# Patient Record
Sex: Female | Born: 1975 | Race: Black or African American | Hispanic: No | Marital: Married | Smoking: Never smoker
Health system: Southern US, Community
[De-identification: ages and names within clinical notes are randomized; demographics above are authoritative.]

## PROBLEM LIST (undated history)

## (undated) DIAGNOSIS — N62 Hypertrophy of breast: Secondary | ICD-10-CM

## (undated) DIAGNOSIS — Z5189 Encounter for other specified aftercare: Secondary | ICD-10-CM

## (undated) DIAGNOSIS — E119 Type 2 diabetes mellitus without complications: Secondary | ICD-10-CM

## (undated) DIAGNOSIS — T7840XA Allergy, unspecified, initial encounter: Secondary | ICD-10-CM

## (undated) DIAGNOSIS — I10 Essential (primary) hypertension: Secondary | ICD-10-CM

## (undated) DIAGNOSIS — E669 Obesity, unspecified: Secondary | ICD-10-CM

## (undated) HISTORY — DX: Obesity, unspecified: E66.9

## (undated) HISTORY — DX: Allergy, unspecified, initial encounter: T78.40XA

## (undated) HISTORY — DX: Encounter for other specified aftercare: Z51.89

## (undated) HISTORY — PX: FOOT SURGERY: SHX648

## (undated) HISTORY — DX: Essential (primary) hypertension: I10

---

## 2000-03-25 ENCOUNTER — Inpatient Hospital Stay (HOSPITAL_COMMUNITY): Admission: AD | Admit: 2000-03-25 | Discharge: 2000-03-28 | Payer: Self-pay | Admitting: *Deleted

## 2000-03-25 ENCOUNTER — Inpatient Hospital Stay (HOSPITAL_COMMUNITY): Admission: AD | Admit: 2000-03-25 | Discharge: 2000-03-25 | Payer: Self-pay | Admitting: *Deleted

## 2000-10-26 ENCOUNTER — Encounter: Admission: RE | Admit: 2000-10-26 | Discharge: 2000-11-25 | Payer: Self-pay | Admitting: Obstetrics and Gynecology

## 2000-12-26 ENCOUNTER — Encounter: Admission: RE | Admit: 2000-12-26 | Discharge: 2001-01-25 | Payer: Self-pay | Admitting: Obstetrics and Gynecology

## 2001-01-26 ENCOUNTER — Encounter: Admission: RE | Admit: 2001-01-26 | Discharge: 2001-02-25 | Payer: Self-pay | Admitting: Obstetrics and Gynecology

## 2001-02-17 ENCOUNTER — Emergency Department (HOSPITAL_COMMUNITY): Admission: EM | Admit: 2001-02-17 | Discharge: 2001-02-18 | Payer: Self-pay | Admitting: Emergency Medicine

## 2001-03-28 ENCOUNTER — Encounter: Admission: RE | Admit: 2001-03-28 | Discharge: 2001-04-27 | Payer: Self-pay | Admitting: Obstetrics and Gynecology

## 2003-01-17 ENCOUNTER — Other Ambulatory Visit: Admission: RE | Admit: 2003-01-17 | Discharge: 2003-01-17 | Payer: Self-pay | Admitting: *Deleted

## 2004-01-17 ENCOUNTER — Other Ambulatory Visit: Admission: RE | Admit: 2004-01-17 | Discharge: 2004-01-17 | Payer: Self-pay | Admitting: Obstetrics and Gynecology

## 2005-01-18 ENCOUNTER — Other Ambulatory Visit: Admission: RE | Admit: 2005-01-18 | Discharge: 2005-01-18 | Payer: Self-pay | Admitting: Obstetrics and Gynecology

## 2006-05-28 ENCOUNTER — Inpatient Hospital Stay (HOSPITAL_COMMUNITY): Admission: AD | Admit: 2006-05-28 | Discharge: 2006-05-30 | Payer: Self-pay | Admitting: Obstetrics and Gynecology

## 2010-05-25 ENCOUNTER — Other Ambulatory Visit: Payer: Self-pay | Admitting: Family Medicine

## 2010-05-25 ENCOUNTER — Ambulatory Visit
Admission: RE | Admit: 2010-05-25 | Discharge: 2010-05-25 | Payer: Self-pay | Source: Home / Self Care | Attending: Family Medicine | Admitting: Family Medicine

## 2010-05-25 ENCOUNTER — Encounter: Payer: Self-pay | Admitting: Family Medicine

## 2010-05-25 DIAGNOSIS — I10 Essential (primary) hypertension: Secondary | ICD-10-CM | POA: Insufficient documentation

## 2010-05-25 DIAGNOSIS — E669 Obesity, unspecified: Secondary | ICD-10-CM | POA: Insufficient documentation

## 2010-05-25 LAB — CBC WITH DIFFERENTIAL/PLATELET
Basophils Absolute: 0 10*3/uL (ref 0.0–0.1)
Basophils Relative: 0.2 % (ref 0.0–3.0)
Eosinophils Absolute: 0.2 10*3/uL (ref 0.0–0.7)
Hemoglobin: 12.4 g/dL (ref 12.0–15.0)
Lymphocytes Relative: 13.9 % (ref 12.0–46.0)
MCHC: 33.3 g/dL (ref 30.0–36.0)
Monocytes Relative: 4.9 % (ref 3.0–12.0)
Neutro Abs: 8.9 10*3/uL — ABNORMAL HIGH (ref 1.4–7.7)
Neutrophils Relative %: 79.6 % — ABNORMAL HIGH (ref 43.0–77.0)
RBC: 4.21 Mil/uL (ref 3.87–5.11)
WBC: 11.2 10*3/uL — ABNORMAL HIGH (ref 4.5–10.5)

## 2010-05-25 LAB — HEPATIC FUNCTION PANEL
AST: 27 U/L (ref 0–37)
Albumin: 3.8 g/dL (ref 3.5–5.2)
Alkaline Phosphatase: 81 U/L (ref 39–117)
Bilirubin, Direct: 0.1 mg/dL (ref 0.0–0.3)
Total Protein: 7.2 g/dL (ref 6.0–8.3)

## 2010-05-25 LAB — BASIC METABOLIC PANEL
Calcium: 9.4 mg/dL (ref 8.4–10.5)
Creatinine, Ser: 0.7 mg/dL (ref 0.4–1.2)
GFR: 134.83 mL/min (ref >60.00)
Sodium: 143 mEq/L (ref 135–145)

## 2010-05-25 LAB — LIPID PANEL
HDL: 48 mg/dL (ref >39.00)
LDL Cholesterol: 86 mg/dL (ref 0–99)
Total CHOL/HDL Ratio: 3
Triglycerides: 32 mg/dL (ref 0.0–149.0)
VLDL: 6.4 mg/dL (ref 0.0–40.0)

## 2010-05-25 LAB — CONVERTED CEMR LAB
Bilirubin Urine: NEGATIVE
Ketones, urine, test strip: NEGATIVE
Nitrite: NEGATIVE
Protein, U semiquant: NEGATIVE
Urobilinogen, UA: 0.2

## 2010-05-31 NOTE — Assessment & Plan Note (Signed)
Summary: BRAND NEW PT/TO EST/PT REQ CPX/PT FASTING/CJR   Vital Signs:  Patient profile:   35 year old female Height:      65.25 inches Weight:      235 pounds BMI:     38.95 Temp:     98.6 degrees F oral BP sitting:   150 / 98  (left arm) Cuff size:   regular  Vitals Entered By: Kern Reap CMA Duncan Dull) (May 25, 2010 8:38 AM)  Nutrition Counseling: Patient's BMI is greater than 25 and therefore counseled on weight management options. CC: new to establish care, headache, elevated blood pressure Is Patient Diabetic? No   CC:  new to establish care, headache, and elevated blood pressure.  History of Present Illness: nyjae is a 35 year old, married female, nonsmoker..  G2, P2......... children are 10 and 4....... who comes in today as a new patient for evaluation of hypertension and headaches.  She noticed in December.  She was beginning to have headaches.  She checked her blood pressure at work and her systolic runs 140 to 160, diastolic 80 to 90.  Her father has hypertension and underlying coronary disease.  Review of systems otherwise negative except for her weight 235 pounds.  Couple years ago.  She got down to 200 with diet and exercise.  Tetanus 2003  Preventive Screening-Counseling & Management  Alcohol-Tobacco     Smoking Status: never  Hep-HIV-STD-Contraception     Dental Visit-last 6 months no      Drug Use:  no.    Allergies (verified): No Known Drug Allergies  Past History:  Past medical, surgical, family and social histories (including risk factors) reviewed, and no changes noted (except as noted below).  Past Surgical History: foot surgery (both) 2 childbirth  Family History: Reviewed history and no changes required. Father:CAD, underline HTN, prostate cancer Mother: sinusitis Siblings: 3 brothers  Social History: Reviewed history and no changes required. Occupation: Audit Married Never Smoked Alcohol use-yes Drug use-no Smoking Status:   never Drug Use:  no Dental Care w/in 6 mos.:  no  Review of Systems      See HPI  Physical Exam  General:  Well-developed,well-nourished,in no acute distress; alert,appropriate and cooperative throughout examination Head:  Normocephalic and atraumatic without obvious abnormalities. No apparent alopecia or balding. Eyes:  No corneal or conjunctival inflammation noted. EOMI. Perrla. Funduscopic exam benign, without hemorrhages, exudates or papilledema. Vision grossly normal. Ears:  External ear exam shows no significant lesions or deformities.  Otoscopic examination reveals clear canals, tympanic membranes are intact bilaterally without bulging, retraction, inflammation or discharge. Hearing is grossly normal bilaterally. Nose:  External nasal examination shows no deformity or inflammation. Nasal mucosa are pink and moist without lesions or exudates. Mouth:  Oral mucosa and oropharynx without lesions or exudates.  Teeth in good repair. Neck:  No deformities, masses, or tenderness noted. Chest Wall:  No deformities, masses, or tenderness noted. Breasts:  No mass, nodules, thickening, tenderness, bulging, retraction, inflamation, nipple discharge or skin changes noted.   Lungs:  Normal respiratory effort, chest expands symmetrically. Lungs are clear to auscultation, no crackles or wheezes. Heart:  Normal rate and regular rhythm. S1 and S2 normal without gallop, murmur, click, rub or other extra sounds. Abdomen:  Bowel sounds positive,abdomen soft and non-tender without masses, organomegaly or hernias noted. Pulses:  R and L carotid,radial,femoral,dorsalis pedis and posterior tibial pulses are full and equal bilaterally Psych:  Cognition and judgment appear intact. Alert and cooperative with normal attention span and concentration. No  apparent delusions, illusions, hallucinations   Problems:  Medical Problems Added: 1)  Dx of Obesity  (ICD-278.00) 2)  Dx of Hypertension, Essential Nos   (ICD-401.9)  Impression & Recommendations:  Problem # 1:  OBESITY (ICD-278.00) Assessment New  Orders: Venipuncture (16109) Prescription Created Electronically [phone]) UA Dipstick w/o Micro (automated)  (81003) Specimen Handling (09811) EKG w/ Interpretation (93000) TLB-Lipid Panel (80061-LIPID) TLB-BMP (Basic Metabolic Panel-BMET) (80048-METABOL) TLB-CBC Platelet - w/Differential (85025-CBCD) TLB-Hepatic/Liver Function Pnl (80076-HEPATIC) TLB-TSH (Thyroid Stimulating Hormone) (84443-TSH)  Problem # 2:  HYPERTENSION, ESSENTIAL NOS (ICD-401.9) Assessment: New  Orders: Venipuncture (91478) Prescription Created Electronically [phone]) UA Dipstick w/o Micro (automated)  (81003) Specimen Handling (13086) EKG w/ Interpretation (93000) TLB-Lipid Panel (80061-LIPID) TLB-BMP (Basic Metabolic Panel-BMET) (80048-METABOL) TLB-CBC Platelet - w/Differential (85025-CBCD) TLB-Hepatic/Liver Function Pnl (80076-HEPATIC) TLB-TSH (Thyroid Stimulating Hormone) (84443-TSH)  Her updated medication list for this problem includes:    Zestoretic 10-12.5 Mg Tabs (Lisinopril-hydrochlorothiazide) .Marland Kitchen... Take 1 tablet by mouth every morning  Complete Medication List: 1)  Mirena 20 Mcg/24hr Iud (Levonorgestrel) .... Use as directed 2)  Zestoretic 10-12.5 Mg Tabs (Lisinopril-hydrochlorothiazide) .... Take 1 tablet by mouth every morning  Patient Instructions: 1)  begin a diet and exercise program by decreasing your caloric intake to 2000 hours daily........... no salt.......... 24 ounces of water daily......... walking 15 minutes daily. 2)  Purchase.  A digital blood pressure cuff checked her blood pressure daily in the morning. 3)  Begin Zestoretic one tablet daily. 4)  Return in two weeks for follow-up with the data and the device Prescriptions: ZESTORETIC 10-12.5 MG TABS (LISINOPRIL-HYDROCHLOROTHIAZIDE) Take 1 tablet by mouth every morning  #100 x 3   Entered and Authorized by:   Roderick Pee MD    Signed by:   Roderick Pee MD on 05/25/2010   Method used:   Electronically to        [facility]  [address] [id number] ([facility]       [address]       [place], [place]  [address]       Ph: [phone] or [phone]       Fax: [phone]   RxID:   [id number] ZESTORETIC 10-12.5 MG TABS (LISINOPRIL-HYDROCHLOROTHIAZIDE) Take 1 tablet by mouth every morning  #100 x 3   Entered and Authorized by:   Roderick Pee MD   Signed by:   Roderick Pee MD on 05/25/2010   Method used:   Print then Give to Patient   RxID:   [id number]    Orders Added: 1)  Venipuncture [55732] 2)  Prescription Created Electronically [G8553] 3)  New Patient Level III [20254] 4)  UA Dipstick w/o Micro (automated)  [81003] 5)  Specimen Handling [99000] 6)  EKG w/ Interpretation [93000] 7)  TLB-Lipid Panel [80061-LIPID] 8)  TLB-BMP (Basic Metabolic Panel-BMET) [80048-METABOL] 9)  TLB-CBC Platelet - w/Differential [85025-CBCD] 10)  TLB-Hepatic/Liver Function Pnl [80076-HEPATIC] 11)  TLB-TSH (Thyroid Stimulating Hormone) [84443-TSH]    Laboratory Results   Urine Tests    Routine Urinalysis   Color: yellow Appearance: Clear Glucose: negative   (Normal Range: Negative) Bilirubin: negative   (Normal Range: Negative) Ketone: negative   (Normal Range: Negative) Spec. Gravity: 1.020   (Normal Range: 1.003-1.035) Blood: negative   (Normal Range: Negative) pH: 7.0   (Normal Range: 5.0-8.0) Protein: negative   (Normal Range: Negative) Urobilinogen: 0.2   (Normal Range: 0-1) Nitrite: negative   (Normal Range: Negative) Leukocyte Esterace: 1+   (Normal Range:  Negative)    Comments: Rita Ohara  May 25, 2010 10:10 AM

## 2010-06-12 ENCOUNTER — Encounter: Payer: Self-pay | Admitting: Family Medicine

## 2010-06-12 ENCOUNTER — Ambulatory Visit (INDEPENDENT_AMBULATORY_CARE_PROVIDER_SITE_OTHER): Payer: BC Managed Care – PPO | Admitting: Family Medicine

## 2010-06-12 VITALS — BP 118/84 | Temp 98.7°F | Ht 65.25 in | Wt 229.0 lb

## 2010-06-12 DIAGNOSIS — I10 Essential (primary) hypertension: Secondary | ICD-10-CM

## 2010-06-12 NOTE — Progress Notes (Signed)
  Subjective:    Patient ID: Michele [name], female    DOB: Jan 02, 1976, 35 y.o.   MRN: [id number]  HPI lovina Is a 35 year old female, who comes in today for follow-up of hypertension.  We start her on lisinopril 10 to 12.52 weeks ago.  BP is now back to normal.  No side effects from medication   Review of Systems Negative    Objective:   Physical Exam Well-developed well-nourished, female, in no acute distress.  BP right arm sitting position 120/80       Assessment & Plan:  Hypertension  Continue current medications  Check BP weekly return yearly sooner if any problems

## 2010-06-12 NOTE — Patient Instructions (Signed)
Continue your current medications  Check your blood pressure weekly.  Return in one year or sooner if any problems

## 2010-09-14 NOTE — H&P (Signed)
[facility]  Patient:    Michele [name], Michele [name]                    MRN: [id number] Adm. Date:  [id number] Attending:  Pleas Koch Dictator:   Wynelle Bourgeois, CNM                         History and Physical  HISTORY OF PRESENT ILLNESS:   Michele [name] is a 35 year old G1, P0 at 40-3/7 weeks, who presents with complaints of increased uterine contractions since earlier today.  She was seen earlier today for a labor check and sent home with Ambien at 1 cm dilation.  Denies leaking and reports positive fetal movement. Pregnancy has been followed by Dr. Elliot Gault at [facility] since 19 weeks and has been remarkable for:  1. Late to care. 2. Conceived on OCPs. 3. Family history of Downs syndrome. 4. Fibroid. 5. Group B strep positive.  PRENATAL LABORATORIES:        Hemoglobin 11.7, hematocrit 35.5, platelets 221, blood type A-positive, antibody screen negative, RPR nonreactive, rubella immune, HBsAg negative, HIV negative, gonorrhea negative, chlamydia negative, AFP normal, glucose challenge within normal limits, group B strep positive.  MEDICAL HISTORY:              Remarkable for conception on Ortho Tri-Cyclen.  FAMILY HISTORY:               Remarkable for maternal aunt with anemia. Father with a tuberculosis, brother with asthma, paternal aunt with thyroid dysfunction and paternal grandmother and paternal aunt with questionable type of cancer.  GENETIC HISTORY:              Remarkable for a father of the babys first cousin with Downs syndrome and the father of the babys first husband with mental retardation.  SOCIAL HISTORY:               Patient is married to [facility], who is involved and supportive.  She is of the [employer].  She denies any alcohol, tobacco or drug use.  PHYSICAL EXAMINATION:  VITAL SIGNS:                  Stable, afebrile.  HEENT:                        Within normal limits.  Thyroid normal, not                 enlarged.  BREASTS:                      Soft, nontender, no masses.  CHEST:                        Clear to auscultation bilaterally.  HEART:                        Regular rate and rhythm.  ABDOMEN:                      Gravid, vertex ______ Thayer Ohm. Electronic fetal monitoring denotes reassuring fetal heart rate with positive accelerations, no decelerations and average variability.  Uterine contractions every 3 minutes.  CERVICAL:                     3 cm, 95% effaced, 0  station.  Vertex                               presentation.  EXTREMITIES:                  Within normal limits.  ASSESSMENT:                   1. Intrauterine pregnancy at 40-3/7 weeks.                               2. Early labor.                               3. Group B strep positive.  PLAN:                         1. Admit to birthing suite per Dr. Elliot Gault.                               2. Routine M.D. orders.                               3. Penicillin prophylaxis per protocol.                               4. Stadol and Phenergan p.r.n. for pain.                               5. Epidural when greater than 4 cm.                               6. Further plans per Dr. Elliot Gault. DD:  03/25/00 TD:  03/25/00 Job: [id number]

## 2010-09-14 NOTE — H&P (Signed)
NAMESONYA, GUNNOE             ACCOUNT NO.:  [id number]   MEDICAL RECORD NO.:  [id number]          PATIENT TYPE:  MAT   LOCATION:  [facility]                          FACILITY:  [facility]   PHYSICIAN:  Osborn Coho, M.D.   DATE OF BIRTH:  1975-10-12   DATE OF ADMISSION:  05/28/2006  DATE OF DISCHARGE:                              HISTORY & PHYSICAL   Ms. Bloxom is a 35 year old, married, black female, gravida 2, para 1-  0-0-1, at 38-6/7 weeks, who presents with leaking fluid since 10 p.m.  She denies bleeding, reports mild contractions and positive fetal  movement.  No signs or symptoms of PIH.  Her pregnancy has been followed  by the [facility] OB[facility]GYN MD service and has been remarkable for:  1. Increased body mass index.  2. First trimester Chlamydia with negative test of cure.  3. Group B strep negative.   Prenatal labs were collected on November 07, 2005.  Hemoglobin 12.1,  hematocrit 36.8, platelets 329,000.  Sickle cell trait is negative.  RPR  nonreactive.  Rubella immune.  Hepatitis B surface antigen negative.  HIV nonreactive.  Pap smear from September 2006 was negative.  Gonorrhea  negative.  Chlamydia positive.  Cystic fibrosis negative.  Blood type A  positive.  An 18-week Glucola from September 14 was 90.  Quad screen  from that time was within normal limits.  Fetal fibronectin from February 25, 2006 was negative.  Glucola from March 07, 2006 was 146.  Three-  hour glucose tolerance test from November 29 showed fasting blood sugar  85, one hour 207, two hour 146, three hour 90.  Group B strep from  April 30, 2006 was negative.   HISTORY OF PRESENT PREGNANCY:  The patient presented for care at [facility] on November 07, 2005 at 9-6/[redacted] weeks gestation. From that first  visit her Chlamydia test was positive.  She was treated with Zithromax  and her spouse was tested.  Husband had negative test results and false  positive screening was reviewed with the patient by Dr.  Normand Sloop.  The  patient had an early Glucola due to her increased body mass index and  that was normal.  Ultrasonography at [redacted] weeks gestation shows growth  consistent with previous dating confirming [facility] of June 05, 2006.  Quad screen was within normal limits.  The patient was evaluated at 25  weeks for some discomfort.  She had a negative fetal  fibronectin.  She  was treated for bacterial vaginosis at that visit.  Glucola done at 27  weeks was elevated but 3-hour glucose tolerance test was normal; only  one value was elevated.  She received her Pap smear in January 2008.  The rest of her prenatal care was unremarkable.   OB HISTORY:  She is a gravida 2, para 1-0-0-1.  In November 2001, she  had a vaginal delivery of a female infant weighing 6 pounds 9 ounces at [redacted]  weeks gestation after 17 hours of labor.  She had an epidural for  anesthesia.  Infant's name is [place] and there were no complications.  Dr.  Narins did the delivery.  This second pregnancy is the present  pregnancy.   MEDICAL HISTORY:  She has no medication allergies.   She experience menarche at the age of 21, 28-day cycles lasting 4 days.  She has used Depo-Provera in the past for 5 years.  Used NuvaRing for 3  months and stopped it in September 2006.  She has occasional yeast  infection.  She reports having had the usual childhood illnesses.  She  did have group B strep with her first pregnancy.   Surgical history is remarkable for foot surgery in 2003.   FAMILY MEDICAL HISTORY:  Father had bypass surgery.  Paternal aunt had  thyroid dysfunction.  Paternal grandfather had history of prostate  cancer.   GENETIC HISTORY:  Negative.   SOCIAL HISTORY:  The patient is married to the father of the baby.  His  name is Everlean Alstrom.  He is involved and supportive.  The patient has 18  years education and is employed full-time as an Product/process development scientist.  The father of  the baby has 15 years education and is a Physicist, medical.  They  denied  any alcohol, tobacco, or illicit drug use with this pregnancy.   OBJECTIVE DATA:  Blood pressure 140/80.  Other vital signs are stable.  She is afebrile.  HEENT:  Grossly within normal limits.  CHEST:  Clear to auscultation.  HEART:  Regular rate and rhythm.  ABDOMEN:  Gravid in contour.  Fundal height extending approximately 39  cm above the pubic symphysis.  Fetal heart rate is reactive and  reassuring with occasional mild variables.  Contractions are irregular  and mild every 4 to 6 minutes.  PELVIC:  Clear fluid is on the perineum.  Positive for Nitrazine,  positive for ferning.  Cervix is posterior, fingertip, 80%, vertex minus  2.  EXTREMITIES:  Normal.   ASSESSMENT:  1. Intrauterine pregnancy at term.  2. Premature rupture of membranes at term.  3. Group B strep negative.   PLAN:  1. Admit to birthing suite.  Dr. Su Hilt has been notified.  2. Routine MD orders.  3. PIH labs.  4. Expectant management.      Cam Hai, C.N.M.      Osborn Coho, M.D.  Electronically Signed    KS/MEDQ  D:  05/28/2006  T:  05/28/2006  Job:  [id number]

## 2011-07-24 ENCOUNTER — Ambulatory Visit (INDEPENDENT_AMBULATORY_CARE_PROVIDER_SITE_OTHER): Payer: BC Managed Care – PPO | Admitting: Obstetrics and Gynecology

## 2011-07-24 DIAGNOSIS — Z01419 Encounter for gynecological examination (general) (routine) without abnormal findings: Secondary | ICD-10-CM

## 2011-08-08 ENCOUNTER — Ambulatory Visit (INDEPENDENT_AMBULATORY_CARE_PROVIDER_SITE_OTHER): Payer: BC Managed Care – PPO | Admitting: Obstetrics and Gynecology

## 2011-08-08 ENCOUNTER — Encounter: Payer: Self-pay | Admitting: Obstetrics and Gynecology

## 2011-08-08 VITALS — BP 128/68 | HR 86 | Wt 229.0 lb

## 2011-08-08 DIAGNOSIS — Z30433 Encounter for removal and reinsertion of intrauterine contraceptive device: Secondary | ICD-10-CM

## 2011-08-08 LAB — POCT URINE PREGNANCY: Preg Test, Ur: NEGATIVE

## 2011-08-08 MED ORDER — IBUPROFEN 200 MG PO TABS: 600.0000 mg | ORAL_TABLET | Freq: Once | ORAL | Status: DC

## 2011-08-08 MED ORDER — LEVONORGESTREL 20 MCG/24HR IU IUD
INTRAUTERINE_SYSTEM | Freq: Once | INTRAUTERINE | Status: AC
  Administered 2011-08-08: 17:00:00 via INTRAUTERINE

## 2011-08-08 NOTE — Progress Notes (Signed)
Addended byWinfred Leeds on: 08/08/2011 06:20 PM   Modules accepted: Orders

## 2011-08-08 NOTE — Progress Notes (Signed)
IUD INSERTION NOTE   Consent signed after risks and benefits were reviewed including but not limited to bleeding, infection and risk of uterine perforation.  LMP: No LMP recorded. Patient is not currently having periods (Reason: IUD). UPT: Negative GC / Chlamydia: done today    Prepping with Betadine Tenaculum placed on anterior lip of cervix Uterus sounded at  8 cm Insertion of MIRENA IUD per protocol without any complications  POST-PROCEDURE:  Patient instructed to call with fever or excessive pain Patient instructed to check IUD strings after each menstrual cycle   Follow-up: 2 weeks   Dakarri Kessinger A MD 08/08/2011 4:19 PM

## 2011-08-08 NOTE — Progress Notes (Signed)
Addended by: Larwance Rote on: 08/08/2011 06:03 PM   Modules accepted: Orders

## 2011-08-29 ENCOUNTER — Ambulatory Visit (INDEPENDENT_AMBULATORY_CARE_PROVIDER_SITE_OTHER): Payer: BC Managed Care – PPO | Admitting: Obstetrics and Gynecology

## 2011-08-29 ENCOUNTER — Encounter: Payer: Self-pay | Admitting: Obstetrics and Gynecology

## 2011-08-29 VITALS — BP 106/62 | Wt 231.0 lb

## 2011-08-29 DIAGNOSIS — Z30431 Encounter for routine checking of intrauterine contraceptive device: Secondary | ICD-10-CM

## 2011-08-29 NOTE — Progress Notes (Signed)
IUD SURVEILLANCE NOTE  MIRENA IUD inserted on 08/08/11  Pain: none Bleeding: minimal Patient has checked IUD strings: no Other complaints: no  EXAM:  IUD strings visualized: yes              Pelvic exam normal: yes  Next visit with annual exam    Wise Fees A MD 08/29/2011 5:16 PM

## 2012-06-22 ENCOUNTER — Encounter: Payer: Self-pay | Admitting: Family Medicine

## 2012-06-22 ENCOUNTER — Ambulatory Visit (INDEPENDENT_AMBULATORY_CARE_PROVIDER_SITE_OTHER): Payer: BC Managed Care – PPO | Admitting: Family Medicine

## 2012-06-22 VITALS — BP 140/90 | Temp 99.1°F | Wt 232.0 lb

## 2012-06-22 DIAGNOSIS — M549 Dorsalgia, unspecified: Secondary | ICD-10-CM

## 2012-06-22 MED ORDER — TRAMADOL HCL 50 MG PO TABS: ORAL_TABLET | ORAL | Status: DC

## 2012-06-22 MED ORDER — LISINOPRIL-HYDROCHLOROTHIAZIDE 10-12.5 MG PO TABS: 1.0000 | ORAL_TABLET | ORAL | Status: DC

## 2012-06-22 MED ORDER — DIAZEPAM 5 MG PO TABS: ORAL_TABLET | ORAL | Status: DC

## 2012-06-22 NOTE — Patient Instructions (Signed)
Complete bed rest at home today Tuesday and Wednesday.......Marland Kitchen Return on Thursday to the office for followup  Motrin 400 mg twice daily with food  Valium and tramadol,,,,,,,,,,,,, one half of each 3 times daily for pain

## 2012-06-22 NOTE — Progress Notes (Signed)
  Subjective:    Patient ID: Michele [name], female    DOB: [birth year], 37 y.o.   MRN: [id number]  HPI Michele [name] is a 25 nonsmoker monitor who travels a lot,,,,,,,, G2 P2,,,,,,, overweight,,,,,,,,, BMI 38,,,,, weight 232 pounds who comes in today for evaluation of back pain  She had an episode of atraumatic right and left lumbar back pain over the Christmas holidays that lasted for about a week. She took 400 mg of Motrin twice a day and went away. 2 weeks ago she states she twisted her back and didn't feel anything until the next day when she began having severe right and left lower lumbar back pain. She says the pain stays in her back but radiates to her right hip. She describes it as constant, dull, sharp if she turns, a 7 on a scale of 1-10 although she is able to sleep at night  Again no history of trauma motor vehicle accidents etc. 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  Family history negative for low back painshe does take Zestoretic for hypertension BP 140/90 she has a Mirena IUD and therefore does not have monthly periods   Review of Systems    review of systems otherwise negative no bowel nor bladder dysfunction Objective:   Physical Exam  Obese female in no acute distress examination the abdomen shows the abdomen markedly obese no masses In the supine position both legs were of equal length sensation muscle strength reflexes are within normal limits. Straight leg raising positive bilaterally at 45      Assessment & Plan:  Low back pain plan bed rest anti-inflammatories Valium and tramadol followup in 3 days

## 2012-06-25 ENCOUNTER — Encounter: Payer: Self-pay | Admitting: Family

## 2012-06-25 ENCOUNTER — Ambulatory Visit (INDEPENDENT_AMBULATORY_CARE_PROVIDER_SITE_OTHER): Payer: BC Managed Care – PPO | Admitting: Family

## 2012-06-25 ENCOUNTER — Ambulatory Visit: Payer: BC Managed Care – PPO | Admitting: Family Medicine

## 2012-06-25 VITALS — BP 120/84 | HR 85 | Wt 232.0 lb

## 2012-06-25 DIAGNOSIS — M5416 Radiculopathy, lumbar region: Secondary | ICD-10-CM

## 2012-06-25 DIAGNOSIS — M545 Low back pain, unspecified: Secondary | ICD-10-CM

## 2012-06-25 DIAGNOSIS — IMO0002 Reserved for concepts with insufficient information to code with codable children: Secondary | ICD-10-CM

## 2012-06-25 MED ORDER — PREDNISONE 20 MG PO TABS: ORAL_TABLET | ORAL | Status: AC

## 2012-06-25 NOTE — Progress Notes (Signed)
Subjective:    Patient ID: Michele [name], female    DOB: 1975/10/17, 37 y.o.   MRN: [id number]  HPI 37 year old [place] American female, patient of Dr. Tawanna Cooler is in today for followup on back pain. She saw Dr. Tawanna Cooler 3 days ago and was prescribed tramadol, valium, and an anti-inflammatory. Today, her pain has improved to about a 4-5/10 from a 7/10. She's been on bedrest since that bedtime. Continues to have low back pain that radiates down her left leg. Previously, it was her right leg. However, she says the pain is not as bad as it previously was.   Review of Systems  Constitutional: Negative.   Respiratory: Negative.   Cardiovascular: Negative.   Musculoskeletal: Positive for back pain.  Skin: Negative.   Neurological: Negative.    Past Medical History  Diagnosis Date  . Hypertension   . Obesity   . No pertinent past medical history     History   Social History  . Marital Status: Single    Spouse Name: N/A    Number of Children: N/A  . Years of Education: N/A   Occupational History  . Not on file.   Social History Main Topics  . Smoking status: Never Smoker   . Smokeless tobacco: Never Used  . Alcohol Use: Yes  . Drug Use: No  . Sexually Active: Yes    Birth Control/ Protection: IUD   Other Topics Concern  . Not on file   Social History Narrative  . No narrative on file    No past surgical history on file.  Family History  Problem Relation Age of Onset  . Cancer Father     Prostate  . Heart disease Father   . Migraines Mother   . Cancer Paternal Grandmother     blood?  . Cancer Paternal Grandfather     prostate    No Known Allergies  Current Outpatient Prescriptions on File Prior to Visit  Medication Sig Dispense Refill  . diazepam (VALIUM) 5 MG tablet One half tab 3 times daily  40 tablet  1  . levonorgestrel (MIRENA) 20 MCG/24HR IUD 1 each by Intrauterine route as directed.        Marland Kitchen lisinopril-hydrochlorothiazide (PRINZIDE,ZESTORETIC) 10-12.5  MG per tablet Take 1 tablet by mouth every morning.  90 tablet  3  . traMADol (ULTRAM) 50 MG tablet One half to one tablet 3 times daily for severe pain  30 tablet  1   Current Facility-Administered Medications on File Prior to Visit  Medication Dose Route Frequency Provider Last Rate Last Dose  . ibuprofen (ADVIL,MOTRIN) tablet 600 mg  600 mg Oral Once Crist Fat Rivard, MD        BP 120/84  Pulse 85  Wt 232 lb (105.235 kg)  BMI 38.33 kg/m2  SpO2 98%chart    Objective:   Physical Exam  Constitutional: She is oriented to person, place, and time. She appears well-developed and well-nourished.  Neck: Normal range of motion. Neck supple.  Cardiovascular: Normal rate, regular rhythm and normal heart sounds.   Pulmonary/Chest: Effort normal and breath sounds normal.  Musculoskeletal: Normal range of motion. She exhibits no edema and no tenderness.  Neurological: She is alert and oriented to person, place, and time. She has normal reflexes. She displays normal reflexes.  Skin: Skin is warm and dry.  Psychiatric: She has a normal mood and affect.          Assessment & Plan:  Assessment:  1. Lumbar radiculopathy  2. Low back pain  Plan: Prednisone 60x3, 40x3, 20x3. Tramadol as needed for pain. Patient to call the office if symptoms worsen or persist. Low back strengthening exercises provided for the patient today. Recheck as scheduled, and as needed.

## 2012-06-25 NOTE — Patient Instructions (Addendum)
Lumbar Radiculopathy  Sciatica is pain, weakness, numbness, or tingling along the path of the sciatic nerve. The nerve starts in the lower back and runs down the back of each leg. The nerve controls the muscles in the lower leg and in the back of the knee, while also providing sensation to the back of the thigh, lower leg, and the sole of your foot. Sciatica is a symptom of another medical condition. For instance, nerve damage or certain conditions, such as a herniated disk or bone spur on the spine, pinch or put pressure on the sciatic nerve. This causes the pain, weakness, or other sensations normally associated with sciatica. Generally, sciatica only affects one side of the body. CAUSES   Herniated or slipped disc.  Degenerative disk disease.  A pain disorder involving the narrow muscle in the buttocks (piriformis syndrome).  Pelvic injury or fracture.  Pregnancy.  Tumor (rare). SYMPTOMS  Symptoms can vary from mild to very severe. The symptoms usually travel from the low back to the buttocks and down the back of the leg. Symptoms can include:  Mild tingling or dull aches in the lower back, leg, or hip.  Numbness in the back of the calf or sole of the foot.  Burning sensations in the lower back, leg, or hip.  Sharp pains in the lower back, leg, or hip.  Leg weakness.  Severe back pain inhibiting movement. These symptoms may get worse with coughing, sneezing, laughing, or prolonged sitting or standing. Also, being overweight may worsen symptoms. DIAGNOSIS  Your caregiver will perform a physical exam to look for common symptoms of sciatica. He or she may ask you to do certain movements or activities that would trigger sciatic nerve pain. Other tests may be performed to find the cause of the sciatica. These may include:  Blood tests.  X-rays.  Imaging tests, such as an MRI or CT scan. TREATMENT  Treatment is directed at the cause of the sciatic pain. Sometimes, treatment is not  necessary and the pain and discomfort goes away on its own. If treatment is needed, your caregiver may suggest:  Over-the-counter medicines to relieve pain.  Prescription medicines, such as anti-inflammatory medicine, muscle relaxants, or narcotics.  Applying heat or ice to the painful area.  Steroid injections to lessen pain, irritation, and inflammation around the nerve.  Reducing activity during periods of pain.  Exercising and stretching to strengthen your abdomen and improve flexibility of your spine. Your caregiver may suggest losing weight if the extra weight makes the back pain worse.  Physical therapy.  Surgery to eliminate what is pressing or pinching the nerve, such as a bone spur or part of a herniated disk. HOME CARE INSTRUCTIONS   Only take over-the-counter or prescription medicines for pain or discomfort as directed by your caregiver.  Apply ice to the affected area for 20 minutes, 3 4 times a day for the first 48 72 hours. Then try heat in the same way.  Exercise, stretch, or perform your usual activities if these do not aggravate your pain.  Attend physical therapy sessions as directed by your caregiver.  Keep all follow-up appointments as directed by your caregiver.  Do not wear high heels or shoes that do not provide proper support.  Check your mattress to see if it is too soft. A firm mattress may lessen your pain and discomfort. SEEK IMMEDIATE MEDICAL CARE IF:   You lose control of your bowel or bladder (incontinence).  You have increasing weakness in the  lower back, pelvis, buttocks, or legs.  You have redness or swelling of your back.  You have a burning sensation when you urinate.  You have pain that gets worse when you lie down or awakens you at night.  Your pain is worse than you have experienced in the past.  Your pain is lasting longer than 4 weeks.  You are suddenly losing weight without reason. MAKE SURE YOU:  Understand these  instructions.  Will watch your condition.  Will get help right away if you are not doing well or get worse. Document Released: 04/09/2001 Document Revised: 10/15/2011 Document Reviewed: 08/25/2011 [facility] Patient Information 2013 [place], [place].   Back Exercises Back exercises help treat and prevent back injuries. The goal of back exercises is to increase the strength of your abdominal and back muscles and the flexibility of your back. These exercises should be started when you no longer have back pain. Back exercises include:  Pelvic Tilt. Lie on your back with your knees bent. Tilt your pelvis until the lower part of your back is against the floor. Hold this position 5 to 10 sec and repeat 5 to 10 times.  Knee to Chest. Pull first 1 knee up against your chest and hold for 20 to 30 seconds, repeat this with the other knee, and then both knees. This may be done with the other leg straight or bent, whichever feels better.  Sit-Ups or Curl-Ups. Bend your knees 90 degrees. Start with tilting your pelvis, and do a partial, slow sit-up, lifting your trunk only 30 to 45 degrees off the floor. Take at least 2 to 3 seconds for each sit-up. Do not do sit-ups with your knees out straight. If partial sit-ups are difficult, simply do the above but with only tightening your abdominal muscles and holding it as directed.  Hip-Lift. Lie on your back with your knees flexed 90 degrees. Push down with your feet and shoulders as you raise your hips a couple inches off the floor; hold for 10 seconds, repeat 5 to 10 times.  Back arches. Lie on your stomach, propping yourself up on bent elbows. Slowly press on your hands, causing an arch in your low back. Repeat 3 to 5 times. Any initial stiffness and discomfort should lessen with repetition over time.  Shoulder-Lifts. Lie face down with arms beside your body. Keep hips and torso pressed to floor as you slowly lift your head and shoulders off the floor. Do not  overdo your exercises, especially in the beginning. Exercises may cause you some mild back discomfort which lasts for a few minutes; however, if the pain is more severe, or lasts for more than 15 minutes, do not continue exercises until you see your caregiver. Improvement with exercise therapy for back problems is slow.  See your caregivers for assistance with developing a proper back exercise program. Document Released: 05/23/2004 Document Revised: 07/08/2011 Document Reviewed: 02/14/2011 [facility] Patient Information 2013 [place], [place].

## 2013-02-03 ENCOUNTER — Encounter: Payer: Self-pay | Admitting: Family Medicine

## 2013-02-03 ENCOUNTER — Ambulatory Visit (INDEPENDENT_AMBULATORY_CARE_PROVIDER_SITE_OTHER): Payer: BC Managed Care – PPO | Admitting: Family Medicine

## 2013-02-03 ENCOUNTER — Ambulatory Visit (INDEPENDENT_AMBULATORY_CARE_PROVIDER_SITE_OTHER)
Admission: RE | Admit: 2013-02-03 | Discharge: 2013-02-03 | Disposition: A | Discharge status: Home / Self Care | Payer: BC Managed Care – PPO | Source: Ambulatory Visit | Attending: Family Medicine | Admitting: Family Medicine

## 2013-02-03 VITALS — BP 120/84 | Temp 98.6°F | Wt 242.0 lb

## 2013-02-03 DIAGNOSIS — M549 Dorsalgia, unspecified: Secondary | ICD-10-CM

## 2013-02-03 DIAGNOSIS — N62 Hypertrophy of breast: Secondary | ICD-10-CM

## 2013-02-03 DIAGNOSIS — E669 Obesity, unspecified: Secondary | ICD-10-CM

## 2013-02-03 MED ORDER — DIAZEPAM 5 MG PO TABS: ORAL_TABLET | ORAL | Status: DC

## 2013-02-03 MED ORDER — TRAMADOL HCL 50 MG PO TABS: 50.0000 mg | ORAL_TABLET | Freq: Three times a day (TID) | ORAL | Status: DC | PRN

## 2013-02-03 NOTE — Progress Notes (Signed)
  Subjective:    Patient ID: Michele [name], female    DOB: Jul 08, 1975, 37 y.o.   MRN: [id number]  HPI Michele [name] is a 37 year old female nonsmoker who comes in today for evaluation of back pain and gynecomastia  We saw her in the spring with an episode of back pain with no antecedent trauma. We placed at bedrest give her Valium and tramadol and resolve fairly quickly. Since that time she's been pain free until 2 weeks ago when this started her bother can. She states this time it started hurting in her neck and her shoulders and now is down in her right and left lower lumbar back pain. She wonders if her large breasts are contributing to her back pain.  Currently the pain as a 6 on a scale of 1-10 minutes dull it comes and goes and lasts for an hour to go away and does not keep her awake at night no fever chills night sweats etc. No bowel or bladder problems. For birth control she has an IUD. 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 She does no exercise. Weight is 242 pounds   Review of Systems    review of systems otherwise negative Objective:   Physical Exam  Well-developed well-nourished female no acute distress examination the spine shows no bony tenderness she is able to sit on the table and lie down without any discomfort. In the supine position her legs were of equal length sensation muscle strength reflexes straight leg raising on negative.     Assessment & Plan:  Low back pain plan Motrin 600 mg twice a day tramadol and Valium each bedtime PT consult, x-ray spine  Gynecomastia consult with Dr. Shon Hough

## 2013-02-03 NOTE — Patient Instructions (Signed)
Take Motrin 600 mg twice daily with food  Valium and tramadol,,,,,,,,,,,,,,, one half or one of each at bedtime  X-rays at the main office today  We will get you set up for a physical therapy consult to help relieve her pain and improve your overall conditioning  Call Dr Louisa Second plastic surgeon for consult concerning the gynecomastia

## 2013-02-04 ENCOUNTER — Telehealth: Payer: Self-pay | Admitting: Family Medicine

## 2013-02-04 NOTE — Telephone Encounter (Signed)
Pt would ike results of xrays done 10/8. pls call.

## 2013-02-05 NOTE — Telephone Encounter (Signed)
Spoke with patient.

## 2013-02-08 ENCOUNTER — Ambulatory Visit: Payer: BC Managed Care – PPO | Attending: Family Medicine

## 2013-02-08 DIAGNOSIS — R5381 Other malaise: Secondary | ICD-10-CM | POA: Insufficient documentation

## 2013-02-08 DIAGNOSIS — M545 Low back pain, unspecified: Secondary | ICD-10-CM | POA: Insufficient documentation

## 2013-02-08 DIAGNOSIS — IMO0001 Reserved for inherently not codable concepts without codable children: Secondary | ICD-10-CM | POA: Insufficient documentation

## 2013-02-12 ENCOUNTER — Ambulatory Visit: Payer: BC Managed Care – PPO | Admitting: Physical Therapy

## 2013-02-15 ENCOUNTER — Ambulatory Visit: Payer: BC Managed Care – PPO

## 2013-02-17 ENCOUNTER — Ambulatory Visit: Payer: BC Managed Care – PPO | Admitting: Physical Therapy

## 2013-02-23 ENCOUNTER — Ambulatory Visit: Payer: BC Managed Care – PPO

## 2013-02-25 ENCOUNTER — Ambulatory Visit: Payer: BC Managed Care – PPO | Admitting: Physical Therapy

## 2013-03-01 ENCOUNTER — Ambulatory Visit: Payer: BC Managed Care – PPO | Attending: Family Medicine | Admitting: Physical Therapy

## 2013-03-01 DIAGNOSIS — M545 Low back pain, unspecified: Secondary | ICD-10-CM | POA: Insufficient documentation

## 2013-03-01 DIAGNOSIS — IMO0001 Reserved for inherently not codable concepts without codable children: Secondary | ICD-10-CM | POA: Insufficient documentation

## 2013-03-01 DIAGNOSIS — R5381 Other malaise: Secondary | ICD-10-CM | POA: Insufficient documentation

## 2013-03-03 ENCOUNTER — Ambulatory Visit: Payer: BC Managed Care – PPO | Admitting: Physical Therapy

## 2013-03-04 ENCOUNTER — Other Ambulatory Visit: Payer: Self-pay

## 2013-03-08 ENCOUNTER — Ambulatory Visit: Payer: BC Managed Care – PPO

## 2013-03-10 ENCOUNTER — Ambulatory Visit: Payer: BC Managed Care – PPO | Admitting: Physical Therapy

## 2013-03-19 NOTE — Progress Notes (Signed)
  Subjective:    Patient ID: Michele [name], female    DOB: [birth year], 37 y.o.   MRN: [id number]  HPI    Review of Systems     Objective:   Physical Exam        Assessment & Plan:  Patient was evaluated for enlarged breast

## 2013-03-22 ENCOUNTER — Ambulatory Visit: Payer: BC Managed Care – PPO | Admitting: Physical Therapy

## 2013-03-29 DIAGNOSIS — N62 Hypertrophy of breast: Secondary | ICD-10-CM

## 2013-03-29 HISTORY — DX: Hypertrophy of breast: N62

## 2013-03-30 ENCOUNTER — Ambulatory Visit: Payer: BC Managed Care – PPO | Attending: Family Medicine | Admitting: Physical Therapy

## 2013-03-30 DIAGNOSIS — M545 Low back pain, unspecified: Secondary | ICD-10-CM | POA: Insufficient documentation

## 2013-03-30 DIAGNOSIS — IMO0001 Reserved for inherently not codable concepts without codable children: Secondary | ICD-10-CM | POA: Insufficient documentation

## 2013-03-30 DIAGNOSIS — R5381 Other malaise: Secondary | ICD-10-CM | POA: Insufficient documentation

## 2013-04-01 ENCOUNTER — Ambulatory Visit: Payer: BC Managed Care – PPO | Admitting: Physical Therapy

## 2013-04-16 ENCOUNTER — Encounter (HOSPITAL_BASED_OUTPATIENT_CLINIC_OR_DEPARTMENT_OTHER): Payer: Self-pay | Admitting: *Deleted

## 2013-04-16 NOTE — Pre-Procedure Instructions (Signed)
To come for BMET and EKG 

## 2013-04-19 ENCOUNTER — Other Ambulatory Visit: Payer: BC Managed Care – PPO

## 2013-04-19 ENCOUNTER — Encounter (HOSPITAL_BASED_OUTPATIENT_CLINIC_OR_DEPARTMENT_OTHER)
Admission: RE | Admit: 2013-04-19 | Discharge: 2013-04-19 | Disposition: A | Discharge status: Home / Self Care | Payer: BC Managed Care – PPO | Source: Ambulatory Visit | Attending: Specialist | Admitting: Specialist

## 2013-04-19 DIAGNOSIS — Z01812 Encounter for preprocedural laboratory examination: Secondary | ICD-10-CM | POA: Insufficient documentation

## 2013-04-19 DIAGNOSIS — Z01810 Encounter for preprocedural cardiovascular examination: Secondary | ICD-10-CM | POA: Insufficient documentation

## 2013-04-19 LAB — BASIC METABOLIC PANEL
BUN: 11 mg/dL (ref 6–23)
CO2: 26 mEq/L (ref 19–32)
Chloride: 104 mEq/L (ref 96–112)
Creatinine, Ser: 0.81 mg/dL (ref 0.50–1.10)
GFR calc Af Amer: >90 mL/min (ref >90)
GFR calc non Af Amer: >90 mL/min (ref >90)

## 2013-04-26 ENCOUNTER — Encounter (HOSPITAL_BASED_OUTPATIENT_CLINIC_OR_DEPARTMENT_OTHER): Payer: Federal, State, Local not specified - PPO | Admitting: Anesthesiology

## 2013-04-26 ENCOUNTER — Encounter (HOSPITAL_BASED_OUTPATIENT_CLINIC_OR_DEPARTMENT_OTHER)
Admission: RE | Disposition: A | Discharge status: Home / Self Care | Payer: Self-pay | Source: Ambulatory Visit | Attending: Specialist

## 2013-04-26 ENCOUNTER — Ambulatory Visit (HOSPITAL_BASED_OUTPATIENT_CLINIC_OR_DEPARTMENT_OTHER)
Admission: RE | Admit: 2013-04-26 | Discharge: 2013-04-27 | Disposition: A | Discharge status: Home / Self Care | Payer: Federal, State, Local not specified - PPO | Source: Ambulatory Visit | Attending: Specialist | Admitting: Specialist

## 2013-04-26 ENCOUNTER — Ambulatory Visit (HOSPITAL_BASED_OUTPATIENT_CLINIC_OR_DEPARTMENT_OTHER): Payer: Federal, State, Local not specified - PPO | Admitting: Anesthesiology

## 2013-04-26 ENCOUNTER — Encounter (HOSPITAL_BASED_OUTPATIENT_CLINIC_OR_DEPARTMENT_OTHER): Payer: Self-pay | Admitting: *Deleted

## 2013-04-26 DIAGNOSIS — Z975 Presence of (intrauterine) contraceptive device: Secondary | ICD-10-CM | POA: Insufficient documentation

## 2013-04-26 DIAGNOSIS — N62 Hypertrophy of breast: Secondary | ICD-10-CM | POA: Insufficient documentation

## 2013-04-26 DIAGNOSIS — E669 Obesity, unspecified: Secondary | ICD-10-CM | POA: Insufficient documentation

## 2013-04-26 DIAGNOSIS — I10 Essential (primary) hypertension: Secondary | ICD-10-CM | POA: Insufficient documentation

## 2013-04-26 HISTORY — DX: Hypertrophy of breast: N62

## 2013-04-26 HISTORY — PX: BREAST REDUCTION SURGERY: SHX8

## 2013-04-26 SURGERY — BREAST REDUCTION WITH LIPOSUCTION
Anesthesia: General | Site: Breast | Laterality: Bilateral

## 2013-04-26 MED ORDER — CEFAZOLIN SODIUM 1 G IJ SOLR
INTRAMUSCULAR | Status: AC
Start: 2013-04-26 — End: 2013-04-26
  Filled 2013-04-26: qty 10

## 2013-04-26 MED ORDER — MIDAZOLAM HCL 2 MG/ML PO SYRP: 12.0000 mg | ORAL_SOLUTION | Freq: Once | ORAL | Status: DC | PRN

## 2013-04-26 MED ORDER — CEFAZOLIN SODIUM-DEXTROSE 2-3 GM-% IV SOLR
2.0000 g | INTRAVENOUS | Status: AC
  Administered 2013-04-26: 2 g via INTRAVENOUS

## 2013-04-26 MED ORDER — MIDAZOLAM HCL 2 MG/2ML IJ SOLN
INTRAMUSCULAR | Status: AC
  Filled 2013-04-26: qty 2

## 2013-04-26 MED ORDER — LIDOCAINE HCL (CARDIAC) 20 MG/ML IV SOLN
INTRAVENOUS | Status: DC | PRN
  Administered 2013-04-26: 100 mg via INTRAVENOUS

## 2013-04-26 MED ORDER — OXYCODONE HCL 5 MG/5ML PO SOLN: 5.0000 mg | Freq: Once | ORAL | Status: AC | PRN

## 2013-04-26 MED ORDER — MIDAZOLAM HCL 2 MG/2ML IJ SOLN: 1.0000 mg | INTRAMUSCULAR | Status: DC | PRN

## 2013-04-26 MED ORDER — FENTANYL CITRATE 0.05 MG/ML IJ SOLN
INTRAMUSCULAR | Status: AC
  Filled 2013-04-26: qty 4

## 2013-04-26 MED ORDER — CEFAZOLIN SODIUM 1-5 GM-% IV SOLN
INTRAVENOUS | Status: AC
  Filled 2013-04-26: qty 100

## 2013-04-26 MED ORDER — HYDROCODONE-ACETAMINOPHEN 5-325 MG PO TABS
ORAL_TABLET | ORAL | Status: AC
  Filled 2013-04-26: qty 2

## 2013-04-26 MED ORDER — DEXAMETHASONE SODIUM PHOSPHATE 4 MG/ML IJ SOLN
INTRAMUSCULAR | Status: DC | PRN
  Administered 2013-04-26: 10 mg via INTRAVENOUS

## 2013-04-26 MED ORDER — SODIUM CHLORIDE 0.9 % IV SOLN
INTRAVENOUS | Status: DC | PRN
  Administered 2013-04-26: 08:00:00 via INTRAMUSCULAR

## 2013-04-26 MED ORDER — PROMETHAZINE HCL 25 MG/ML IJ SOLN: 6.2500 mg | INTRAMUSCULAR | Status: DC | PRN

## 2013-04-26 MED ORDER — PROPOFOL 10 MG/ML IV BOLUS
INTRAVENOUS | Status: AC
  Filled 2013-04-26: qty 20

## 2013-04-26 MED ORDER — CEFAZOLIN SODIUM 1 G IJ SOLR
INTRAMUSCULAR | Status: AC
  Filled 2013-04-26: qty 10

## 2013-04-26 MED ORDER — ONDANSETRON HCL 4 MG PO TABS: 4.0000 mg | ORAL_TABLET | Freq: Four times a day (QID) | ORAL | Status: DC | PRN

## 2013-04-26 MED ORDER — ONDANSETRON HCL 4 MG/2ML IJ SOLN
INTRAMUSCULAR | Status: DC | PRN
  Administered 2013-04-26: 4 mg via INTRAVENOUS

## 2013-04-26 MED ORDER — PROPOFOL 10 MG/ML IV BOLUS
INTRAVENOUS | Status: DC | PRN
  Administered 2013-04-26: 200 mg via INTRAVENOUS

## 2013-04-26 MED ORDER — MORPHINE SULFATE 2 MG/ML IJ SOLN: 2.0000 mg | INTRAMUSCULAR | Status: DC | PRN

## 2013-04-26 MED ORDER — CEFAZOLIN SODIUM 1-5 GM-% IV SOLN
1.0000 g | Freq: Three times a day (TID) | INTRAVENOUS | Status: DC
  Administered 2013-04-26 – 2013-04-27 (×3): 1 g via INTRAVENOUS

## 2013-04-26 MED ORDER — EPHEDRINE SULFATE 50 MG/ML IJ SOLN
INTRAMUSCULAR | Status: DC | PRN
  Administered 2013-04-26: 15 mg via INTRAVENOUS

## 2013-04-26 MED ORDER — LIDOCAINE-EPINEPHRINE 0.5 %-1:200000 IJ SOLN
INTRAMUSCULAR | Status: AC
  Filled 2013-04-26: qty 2

## 2013-04-26 MED ORDER — DEXTROSE IN LACTATED RINGERS 5 % IV SOLN
INTRAVENOUS | Status: DC
  Administered 2013-04-26 (×2): via INTRAVENOUS

## 2013-04-26 MED ORDER — FENTANYL CITRATE 0.05 MG/ML IJ SOLN
INTRAMUSCULAR | Status: DC | PRN
  Administered 2013-04-26: 25 ug via INTRAVENOUS
  Administered 2013-04-26 (×2): 50 ug via INTRAVENOUS
  Administered 2013-04-26: 100 ug via INTRAVENOUS

## 2013-04-26 MED ORDER — HYDROCODONE-ACETAMINOPHEN 5-325 MG PO TABS
1.0000 | ORAL_TABLET | ORAL | Status: DC | PRN
  Administered 2013-04-26 – 2013-04-27 (×5): 2 via ORAL

## 2013-04-26 MED ORDER — LACTATED RINGERS IV SOLN
INTRAVENOUS | Status: DC
  Administered 2013-04-26 (×3): via INTRAVENOUS

## 2013-04-26 MED ORDER — FENTANYL CITRATE 0.05 MG/ML IJ SOLN
INTRAMUSCULAR | Status: AC
  Filled 2013-04-26: qty 6

## 2013-04-26 MED ORDER — FENTANYL CITRATE 0.05 MG/ML IJ SOLN: 50.0000 ug | INTRAMUSCULAR | Status: DC | PRN

## 2013-04-26 MED ORDER — MIDAZOLAM HCL 5 MG/5ML IJ SOLN
INTRAMUSCULAR | Status: DC | PRN
  Administered 2013-04-26: 2 mg via INTRAVENOUS

## 2013-04-26 MED ORDER — OXYCODONE HCL 5 MG PO TABS: 5.0000 mg | ORAL_TABLET | Freq: Once | ORAL | Status: AC | PRN

## 2013-04-26 MED ORDER — FENTANYL CITRATE 0.05 MG/ML IJ SOLN: 50.0000 ug | Freq: Once | INTRAMUSCULAR | Status: DC

## 2013-04-26 MED ORDER — ONDANSETRON HCL 4 MG/2ML IJ SOLN: 4.0000 mg | Freq: Four times a day (QID) | INTRAMUSCULAR | Status: DC | PRN

## 2013-04-26 MED ORDER — HYDROMORPHONE HCL PF 1 MG/ML IJ SOLN: 0.2500 mg | INTRAMUSCULAR | Status: DC | PRN

## 2013-04-26 MED ORDER — SUCCINYLCHOLINE CHLORIDE 20 MG/ML IJ SOLN
INTRAMUSCULAR | Status: DC | PRN
  Administered 2013-04-26: 100 mg via INTRAVENOUS

## 2013-04-26 SURGICAL SUPPLY — 70 items
APL SKNCLS STERI-STRIP NONHPOA (GAUZE/BANDAGES/DRESSINGS) ×2
BAG DECANTER FOR FLEXI CONT (MISCELLANEOUS) ×2 IMPLANT
BENZOIN TINCTURE PRP APPL 2/3 (GAUZE/BANDAGES/DRESSINGS) ×4 IMPLANT
BINDER BREAST LRG (GAUZE/BANDAGES/DRESSINGS) IMPLANT
BINDER BREAST XLRG (GAUZE/BANDAGES/DRESSINGS) IMPLANT
BINDER BREAST XXLRG (GAUZE/BANDAGES/DRESSINGS) ×1 IMPLANT
BLADE KNIFE  20 PERSONNA (BLADE) ×2
BLADE KNIFE 20 PERSONNA (BLADE) ×2 IMPLANT
BLADE KNIFE PERSONA 10 (BLADE) ×8 IMPLANT
BLADE KNIFE PERSONA 15 (BLADE) ×2 IMPLANT
CANISTER LINER 1300 C W/ELBOW (MISCELLANEOUS) IMPLANT
CANISTER SUCT 1200ML W/VALVE (MISCELLANEOUS) ×2 IMPLANT
COVER MAYO STAND STRL (DRAPES) ×2 IMPLANT
COVER TABLE BACK 60X90 (DRAPES) ×2 IMPLANT
DECANTER SPIKE VIAL GLASS SM (MISCELLANEOUS) ×4 IMPLANT
DRAIN CHANNEL 10F 3/8 F FF (DRAIN) ×4 IMPLANT
DRAIN PENROSE 1/2X12 LTX STRL (WOUND CARE) IMPLANT
DRAPE LAPAROSCOPIC ABDOMINAL (DRAPES) ×2 IMPLANT
ELECT REM PT RETURN 9FT ADLT (ELECTROSURGICAL) ×2
ELECTRODE REM PT RTRN 9FT ADLT (ELECTROSURGICAL) ×1 IMPLANT
EVACUATOR SILICONE 100CC (DRAIN) ×4 IMPLANT
FILTER 7/8 IN (FILTER) ×2 IMPLANT
FILTER LIPOSUCTION (MISCELLANEOUS) IMPLANT
GAUZE XEROFORM 5X9 LF (GAUZE/BANDAGES/DRESSINGS) ×4 IMPLANT
GLOVE BIO SURGEON STRL SZ 6.5 (GLOVE) ×2 IMPLANT
GLOVE BIOGEL M STRL SZ7.5 (GLOVE) ×2 IMPLANT
GLOVE BIOGEL PI IND STRL 8 (GLOVE) ×1 IMPLANT
GLOVE BIOGEL PI INDICATOR 8 (GLOVE) ×1
GLOVE ECLIPSE 7.0 STRL STRAW (GLOVE) ×2 IMPLANT
GOWN PREVENTION PLUS XLARGE (GOWN DISPOSABLE) ×1 IMPLANT
GOWN PREVENTION PLUS XXLARGE (GOWN DISPOSABLE) ×3 IMPLANT
IV LACTATED RINGERS 1000ML (IV SOLUTION) IMPLANT
IV NS 500ML (IV SOLUTION) ×4
IV NS 500ML BAXH (IV SOLUTION) ×2 IMPLANT
NDL SAFETY ECLIPSE 18X1.5 (NEEDLE) IMPLANT
NDL SPNL 18GX3.5 QUINCKE PK (NEEDLE) ×1 IMPLANT
NEEDLE HYPO 18GX1.5 SHARP (NEEDLE)
NEEDLE SPNL 18GX3.5 QUINCKE PK (NEEDLE) ×2 IMPLANT
NS IRRIG 1000ML POUR BTL (IV SOLUTION) IMPLANT
PACK BASIN DAY SURGERY FS (CUSTOM PROCEDURE TRAY) ×2 IMPLANT
PAD ABD 8X10 STRL (GAUZE/BANDAGES/DRESSINGS) ×8 IMPLANT
PAD ALCOHOL SWAB (MISCELLANEOUS) IMPLANT
PEN SKIN MARKING BROAD TIP (MISCELLANEOUS) ×2 IMPLANT
PIN SAFETY STERILE (MISCELLANEOUS) ×2 IMPLANT
SHEETING SILICONE GEL EPI DERM (MISCELLANEOUS) IMPLANT
SLEEVE SCD COMPRESS KNEE MED (MISCELLANEOUS) ×2 IMPLANT
SPECIMEN JAR MEDIUM (MISCELLANEOUS) IMPLANT
SPECIMEN JAR X LARGE (MISCELLANEOUS) ×4 IMPLANT
SPONGE GAUZE 4X4 12PLY (GAUZE/BANDAGES/DRESSINGS) ×4 IMPLANT
SPONGE LAP 18X18 X RAY DECT (DISPOSABLE) ×8 IMPLANT
STRIP SUTURE WOUND CLOSURE 1/2 (SUTURE) ×10 IMPLANT
SUT MNCRL AB 3-0 PS2 18 (SUTURE) ×16 IMPLANT
SUT MON AB 2-0 CT1 36 (SUTURE) IMPLANT
SUT MON AB 5-0 PS2 18 (SUTURE) ×4 IMPLANT
SUT PROLENE 2 0 CT2 30 (SUTURE) ×2 IMPLANT
SUT PROLENE 3 0 PS 2 (SUTURE) ×12 IMPLANT
SYR 20CC LL (SYRINGE) IMPLANT
SYR 50ML LL SCALE MARK (SYRINGE) ×4 IMPLANT
SYR CONTROL 10ML LL (SYRINGE) IMPLANT
TAPE HYPAFIX 6X30 (GAUZE/BANDAGES/DRESSINGS) ×2 IMPLANT
TAPE MEASURE 72IN RETRACT (INSTRUMENTS)
TAPE MEASURE LINEN 72IN RETRCT (INSTRUMENTS) IMPLANT
TAPE PAPER MEDFIX 1IN X 10YD (GAUZE/BANDAGES/DRESSINGS) ×2 IMPLANT
TOWEL OR 17X24 6PK STRL BLUE (TOWEL DISPOSABLE) ×8 IMPLANT
TRAY DSU PREP LF (CUSTOM PROCEDURE TRAY) ×4 IMPLANT
TUBE CONNECTING 20X1/4 (TUBING) ×2 IMPLANT
TUBING SET GRADUATE ASPIR 12FT (MISCELLANEOUS) IMPLANT
UNDERPAD 30X30 INCONTINENT (UNDERPADS AND DIAPERS) ×2 IMPLANT
VAC PENCILS W/TUBING CLEAR (MISCELLANEOUS) ×2 IMPLANT
YANKAUER SUCT BULB TIP NO VENT (SUCTIONS) ×2 IMPLANT

## 2013-04-26 NOTE — H&P (Signed)
Michele [name] is an 37 y.o. female.   Chief Complaint: Severe macromastia HPI: Increased back and shoulder pain and intertrigo  Past Medical History  Diagnosis Date  . Obesity   . Hypertension     under control with med., has been on med. x 2 yr.  . Hypertrophy of breast 03/2013    Past Surgical History  Procedure Laterality Date  . Foot surgery  2003; 2004    each foot    Family History  Problem Relation Age of Onset  . Cancer Father     Prostate  . Heart disease Father   . Migraines Mother   . Cancer Paternal Grandmother     blood?  . Cancer Paternal Grandfather     prostate   Social History:  reports that she has never smoked. She has never used smokeless tobacco. She reports that she drinks alcohol. She reports that she does not use illicit drugs.  Allergies: No Known Allergies  Medications Prior to Admission  Medication Sig Dispense Refill  . levonorgestrel (MIRENA) 20 MCG/24HR IUD 1 each by Intrauterine route as directed.        Marland Kitchen lisinopril-hydrochlorothiazide (PRINZIDE,ZESTORETIC) 10-12.5 MG per tablet Take 1 tablet by mouth every morning.  90 tablet  3    No results found for this or any previous visit (from the past 48 hour(s)). No results found.  Review of Systems  Constitutional: Negative.   Eyes: Negative.   Respiratory: Negative.   Cardiovascular: Negative.   Gastrointestinal: Negative.   Genitourinary: Negative.   Musculoskeletal: Positive for back pain and neck pain.  Neurological: Positive for headaches.  Endo/Heme/Allergies: Negative.   Psychiatric/Behavioral: Negative.     Blood pressure 114/80, pulse 88, temperature 98.9 F (37.2 C), temperature source Oral, resp. rate 16, height 5\' 5"  (1.651 m), weight 104.327 kg (230 lb), SpO2 100.00%. Physical Exam   Assessment/Plan Severe macromastia for bilateral breast reductions and excision of accessory breast tissue  Michele [name] L 04/26/2013, 7:23 AM

## 2013-04-26 NOTE — Anesthesia Postprocedure Evaluation (Signed)
  Anesthesia Post-op Note  Patient: Michele [name]  Procedure(s) Performed: Procedure(s): BILATERAL BREAST REDUCTION WITH LIPOSUCTION (Bilateral)  Patient Location: PACU  Anesthesia Type:General  Level of Consciousness: awake and alert   Airway and Oxygen Therapy: Patient Spontanous Breathing  Post-op Pain: mild  Post-op Assessment: Post-op Vital signs reviewed, Patient's Cardiovascular Status Stable, Respiratory Function Stable, Patent Airway, No signs of Nausea or vomiting and Pain level controlled  Post-op Vital Signs: Reviewed and stable  Complications: No apparent anesthesia complications

## 2013-04-26 NOTE — Anesthesia Preprocedure Evaluation (Signed)
Anesthesia Evaluation  Patient identified by MRN, date of birth, ID band Patient awake    Reviewed: Allergy & Precautions, H&P , NPO status , Patient's Chart, lab work & pertinent test results  Airway Mallampati: II TM Distance: >3 FB Neck ROM: Full    Dental   Pulmonary  breath sounds clear to auscultation        Cardiovascular hypertension, Rhythm:Regular Rate:Normal     Neuro/Psych    GI/Hepatic   Endo/Other    Renal/GU      Musculoskeletal   Abdominal (+) + obese,   Peds  Hematology   Anesthesia Other Findings   Reproductive/Obstetrics                           Anesthesia Physical Anesthesia Plan  ASA: II  Anesthesia Plan: General   Post-op Pain Management:    Induction: Intravenous  Airway Management Planned: Oral ETT  Additional Equipment:   Intra-op Plan:   Post-operative Plan: Extubation in OR  Informed Consent: I have reviewed the patients History and Physical, chart, labs and discussed the procedure including the risks, benefits and alternatives for the proposed anesthesia with the patient or authorized representative who has indicated his/her understanding and acceptance.     Plan Discussed with: CRNA and Surgeon  Anesthesia Plan Comments:         Anesthesia Quick Evaluation

## 2013-04-26 NOTE — Anesthesia Procedure Notes (Signed)
Procedure Name: Intubation Performed by: Annmarie Plemmons, Kosse Pre-anesthesia Checklist: Patient identified, Emergency Drugs available, Suction available and Patient being monitored Patient Re-evaluated:Patient Re-evaluated prior to inductionOxygen Delivery Method: Circle System Utilized Preoxygenation: Pre-oxygenation with 100% oxygen Intubation Type: IV induction Ventilation: Mask ventilation without difficulty Laryngoscope Size: Mac and 3 Grade View: Grade II Tube type: Oral Tube size: 7.0 mm Number of attempts: 1 Airway Equipment and Method: stylet and oral airway Placement Confirmation: ETT inserted through vocal cords under direct vision,  positive ETCO2 and breath sounds checked- equal and bilateral Tube secured with: Tape Dental Injury: Teeth and Oropharynx as per pre-operative assessment      

## 2013-04-26 NOTE — Brief Op Note (Signed)
04/26/2013  10:02 AM  PATIENT:  [name]  37 y.o. female  PRE-OPERATIVE DIAGNOSIS:  Bilateral Hypertrophy of Breast  POST-OPERATIVE DIAGNOSIS:  Bilateral Hypertrophy of Breast (Macromastia)  PROCEDURE:  Procedure(s): BILATERAL BREAST REDUCTION WITH LIPOSUCTION (Bilateral)  SURGEON:  Surgeon(s) and Role:    * Louisa Second, MD - Primary  PHYSICIAN ASSISTANT:   ASSISTANTS: none   ANESTHESIA:   general  EBL:     BLOOD ADMINISTERED:none  DRAINS: (right and left lateral chest areas) Jackson-Pratt drain(s) with closed bulb suction in the right and left lateral chest areas   LOCAL MEDICATIONS USED:  LIDOCAINE   SPECIMEN:  Excision  DISPOSITION OF SPECIMEN:  PATHOLOGY  COUNTS:  YES  TOURNIQUET:  * No tourniquets in log *  DICTATION: .Other Dictation: Dictation Number [id number]  PLAN OF CARE: Admit for overnight observation  PATIENT DISPOSITION:  PACU - hemodynamically stable.   Delay start of Pharmacological VTE agent (>24hrs) due to surgical blood loss or risk of bleeding: yes

## 2013-04-26 NOTE — Transfer of Care (Signed)
Immediate Anesthesia Transfer of Care Note  Patient: Michele [name]  Procedure(s) Performed: Procedure(s): BILATERAL BREAST REDUCTION WITH LIPOSUCTION (Bilateral)  Patient Location: PACU  Anesthesia Type:General  Level of Consciousness: awake and patient cooperative  Airway & Oxygen Therapy: Patient Spontanous Breathing and Patient connected to face mask oxygen  Post-op Assessment: Report given to PACU RN and Post -op Vital signs reviewed and stable  Post vital signs: Reviewed and stable  Complications: No apparent anesthesia complications

## 2013-04-27 ENCOUNTER — Encounter (HOSPITAL_BASED_OUTPATIENT_CLINIC_OR_DEPARTMENT_OTHER): Payer: Self-pay | Admitting: Specialist

## 2013-04-27 ENCOUNTER — Encounter: Payer: BC Managed Care – PPO | Admitting: Family Medicine

## 2013-04-27 MED ORDER — HYDROCODONE-ACETAMINOPHEN 5-325 MG PO TABS
ORAL_TABLET | ORAL | Status: AC
  Filled 2013-04-27: qty 2

## 2013-04-27 MED ORDER — CEFAZOLIN SODIUM 1 G IJ SOLR
INTRAMUSCULAR | Status: AC
  Filled 2013-04-27: qty 10

## 2013-04-27 NOTE — Op Note (Signed)
NAMETRELLA, THURMOND             ACCOUNT NO.:  [id number]  MEDICAL RECORD NO.:  [id number]  LOCATION:                               FACILITY:  [facility]  PHYSICIAN:  Earvin Hansen L. Mulki Roesler, M.D.DATE OF BIRTH:  [birth year]  DATE OF PROCEDURE:  04/26/2013 DATE OF DISCHARGE:  04/27/2013                              OPERATIVE REPORT   HISTORY:  This is a 37 year old lady with severe macromastia, back and shoulder pain secondary to large pendulous breasts, as well as increased intertriginous tissue and accessory breast tissue.  Procedures done in bilateral breast reductions using the inferior pedicle technique and excision of accessory breast tissue.  Anesthesia is general. Preoperatively, the patient sat up and drawn for the reduction of mammoplasty remarked the nipple-areolar complexes up to 24 cm from the suprasternal notch from over 40.  She underwent general anesthesia, intubated orally.  Prep was done to the chest, breast areas in routine fashion and using Hibiclens soap and solution, it walled off with sterile towels and drapes so as to make a sterile field.  A 1.25% Xylocaine with epinephrine was injected locally, 200 mL per side, 1:400,000 concentration.  The wound was allowed to set up and then wounds were scored with #15 blade.  Skin over the inferior pedicle was de-epithelialized with #20 blades.  Medial and lateral fatty dermal pedicles were incised down to the underlying fascia, allowing more tissue was removed sharply with the Bovie unit on coagulation and cutting, removing the excessive amounts of accessory breast tissue and the axillary latissimus dorsi regions.  After proper hemostasis, the flaps were transposed and stayed after the pedicle of the hip was debulked, stayed with 3-0 Prolene sutures.  Subcutaneous closed with #2- 0 Monocryl and 3-0 Monocryl x2 layers with a running subcuticular stitch of 3-0 Monocryl.  Steri-Strips and soft dressing were applied.  All areas of  the wounds were drained with #10 fully fluted Blake drains which were placed in the depths of the wound and brought out through the lateral-most portion of the incision and secured with 3-0 Prolene and good symmetry.  Excellent blood supply.  Nipple-areolar complexes.  The Steri-Strips and soft dressing applied.  Xeroform, 4x4s, ABDs, Hypafix tape.  She tolerated the procedures very well, was taken to recovery in excellent condition.     Yaakov Guthrie. Shon Hough, M.D.     Cathie Hoops  D:  04/26/2013  T:  04/27/2013  Job:  [id number]

## 2013-05-24 ENCOUNTER — Other Ambulatory Visit (INDEPENDENT_AMBULATORY_CARE_PROVIDER_SITE_OTHER): Payer: Federal, State, Local not specified - PPO

## 2013-05-24 DIAGNOSIS — Z0000 Encounter for general adult medical examination without abnormal findings: Secondary | ICD-10-CM

## 2013-05-24 LAB — HEPATIC FUNCTION PANEL
ALK PHOS: 87 U/L (ref 39–117)
ALT: 19 U/L (ref 0–35)
AST: 20 U/L (ref 0–37)
Albumin: 3.8 g/dL (ref 3.5–5.2)
BILIRUBIN DIRECT: 0.2 mg/dL (ref 0.0–0.3)
BILIRUBIN TOTAL: 0.8 mg/dL (ref 0.3–1.2)
Total Protein: 7.7 g/dL (ref 6.0–8.3)

## 2013-05-24 LAB — LIPID PANEL
CHOLESTEROL: 149 mg/dL (ref 0–200)
HDL: 45.1 mg/dL (ref >39.00)
LDL Cholesterol: 97 mg/dL (ref 0–99)
TRIGLYCERIDES: 37 mg/dL (ref 0.0–149.0)
Total CHOL/HDL Ratio: 3
VLDL: 7.4 mg/dL (ref 0.0–40.0)

## 2013-05-24 LAB — CBC WITH DIFFERENTIAL/PLATELET
BASOS ABS: 0 10*3/uL (ref 0.0–0.1)
Basophils Relative: 0.4 % (ref 0.0–3.0)
EOS ABS: 0.2 10*3/uL (ref 0.0–0.7)
Eosinophils Relative: 2.3 % (ref 0.0–5.0)
HCT: 37.2 % (ref 36.0–46.0)
Hemoglobin: 12.2 g/dL (ref 12.0–15.0)
LYMPHS PCT: 20.1 % (ref 12.0–46.0)
Lymphs Abs: 1.8 10*3/uL (ref 0.7–4.0)
MCHC: 32.7 g/dL (ref 30.0–36.0)
MCV: 86.7 fl (ref 78.0–100.0)
MONO ABS: 0.5 10*3/uL (ref 0.1–1.0)
Monocytes Relative: 5.3 % (ref 3.0–12.0)
NEUTROS PCT: 71.9 % (ref 43.0–77.0)
Neutro Abs: 6.3 10*3/uL (ref 1.4–7.7)
PLATELETS: 292 10*3/uL (ref 150.0–400.0)
RBC: 4.3 Mil/uL (ref 3.87–5.11)
RDW: 14.3 % (ref 11.5–14.6)
WBC: 8.7 10*3/uL (ref 4.5–10.5)

## 2013-05-24 LAB — POCT URINALYSIS DIPSTICK
BILIRUBIN UA: NEGATIVE
Blood, UA: NEGATIVE
GLUCOSE UA: NEGATIVE
KETONES UA: NEGATIVE
Nitrite, UA: NEGATIVE
Protein, UA: NEGATIVE
SPEC GRAV UA: 1.02
Urobilinogen, UA: 0.2
pH, UA: 6

## 2013-05-24 LAB — BASIC METABOLIC PANEL
BUN: 11 mg/dL (ref 6–23)
CALCIUM: 9.1 mg/dL (ref 8.4–10.5)
CHLORIDE: 105 meq/L (ref 96–112)
CO2: 27 mEq/L (ref 19–32)
CREATININE: 0.9 mg/dL (ref 0.4–1.2)
GFR: 95.91 mL/min (ref >60.00)
Glucose, Bld: 92 mg/dL (ref 70–99)
Potassium: 4.2 mEq/L (ref 3.5–5.1)
Sodium: 140 mEq/L (ref 135–145)

## 2013-05-24 LAB — TSH: TSH: 1.28 u[IU]/mL (ref 0.35–5.50)

## 2013-05-31 ENCOUNTER — Encounter: Payer: Self-pay | Admitting: Family Medicine

## 2013-05-31 ENCOUNTER — Ambulatory Visit (INDEPENDENT_AMBULATORY_CARE_PROVIDER_SITE_OTHER): Payer: Federal, State, Local not specified - PPO | Admitting: Family Medicine

## 2013-05-31 VITALS — BP 120/80 | Temp 98.5°F | Ht 65.75 in | Wt 233.0 lb

## 2013-05-31 DIAGNOSIS — I10 Essential (primary) hypertension: Secondary | ICD-10-CM

## 2013-05-31 DIAGNOSIS — N62 Hypertrophy of breast: Secondary | ICD-10-CM

## 2013-05-31 DIAGNOSIS — Z23 Encounter for immunization: Secondary | ICD-10-CM

## 2013-05-31 DIAGNOSIS — E669 Obesity, unspecified: Secondary | ICD-10-CM

## 2013-05-31 MED ORDER — LISINOPRIL-HYDROCHLOROTHIAZIDE 10-12.5 MG PO TABS: 1.0000 | ORAL_TABLET | ORAL | Status: DC

## 2013-05-31 NOTE — Progress Notes (Signed)
   Subjective:    Patient ID: Rogers Seeds, female    DOB: Jun 19, 1975, 38 y.o.   MRN: [id number]  HPI Mrs. since is a 38 year old married female nonsmoker who comes in today for followup of hypertension  She takes Zestoretic 10-12.5 daily for hypertension BP 120/80  Review of systems negative except she had a breast reduction 2 months ago by Dr. Elliot Cousin. Healing well  She had an IUD put in by her GYN and has no periods   Review of Systems  Constitutional: Negative.   HENT: Negative.   Eyes: Negative.   Respiratory: Negative.   Cardiovascular: Negative.   Gastrointestinal: Negative.   Genitourinary: Negative.   Musculoskeletal: Negative.   Neurological: Negative.   Psychiatric/Behavioral: Negative.        Objective:   Physical Exam  Nursing note and vitals reviewed. Constitutional: She appears well-developed and well-nourished.  HENT:  Head: Normocephalic and atraumatic.  Right Ear: External ear normal.  Left Ear: External ear normal.  Nose: Nose normal.  Mouth/Throat: Oropharynx is clear and moist.  Eyes: EOM are normal. Pupils are equal, round, and reactive to light.  Neck: Normal range of motion. Neck supple. No thyromegaly present.  Cardiovascular: Normal rate, regular rhythm, normal heart sounds and intact distal pulses.  Exam reveals no gallop and no friction rub.   No murmur heard. Pulmonary/Chest: Effort normal and breath sounds normal.  Abdominal: Soft. Bowel sounds are normal. She exhibits no distension and no mass. There is no tenderness. There is no rebound.  Musculoskeletal: Normal range of motion.  Lymphadenopathy:    She has no cervical adenopathy.  Neurological: She is alert. She has normal reflexes. No cranial nerve deficit. She exhibits normal muscle tone. Coordination normal.  Skin: Skin is warm and dry.  Psychiatric: She has a normal mood and affect. Her behavior is normal. Judgment and thought content normal.          Assessment & Plan:    Healthy female  Hypertension at goal continue current therapy  Status post breast reduction by Dr. Elliot Cousin

## 2013-05-31 NOTE — Progress Notes (Signed)
Pre visit review using our clinic review tool, if applicable. No additional management support is needed unless otherwise documented below in the visit note. 

## 2013-05-31 NOTE — Patient Instructions (Signed)
Continue your blood pressure medication,,,,,,,, 1 tab daily in the morning  Return in one year for followup sooner if any problems

## 2013-06-01 ENCOUNTER — Telehealth: Payer: Self-pay | Admitting: Family Medicine

## 2013-06-01 NOTE — Telephone Encounter (Signed)
Relevant patient education assigned to patient using Emmi. ° °

## 2013-06-20 ENCOUNTER — Other Ambulatory Visit: Payer: Self-pay | Admitting: Family Medicine

## 2014-01-25 ENCOUNTER — Other Ambulatory Visit: Payer: Self-pay | Admitting: Obstetrics and Gynecology

## 2014-01-25 DIAGNOSIS — N6320 Unspecified lump in the left breast, unspecified quadrant: Principal | ICD-10-CM

## 2014-01-25 DIAGNOSIS — N6325 Unspecified lump in the left breast, overlapping quadrants: Secondary | ICD-10-CM

## 2014-01-25 DIAGNOSIS — Z9889 Other specified postprocedural states: Secondary | ICD-10-CM

## 2014-02-28 ENCOUNTER — Encounter: Payer: Self-pay | Admitting: Family Medicine

## 2014-04-26 ENCOUNTER — Other Ambulatory Visit: Payer: Self-pay

## 2014-08-20 ENCOUNTER — Other Ambulatory Visit: Payer: Self-pay | Admitting: Family Medicine

## 2014-10-03 ENCOUNTER — Other Ambulatory Visit: Payer: Self-pay | Admitting: Family Medicine

## 2015-06-19 ENCOUNTER — Other Ambulatory Visit: Payer: Self-pay | Admitting: Family Medicine

## 2015-07-23 ENCOUNTER — Other Ambulatory Visit: Payer: Self-pay | Admitting: Family Medicine

## 2015-08-23 ENCOUNTER — Encounter: Payer: Federal, State, Local not specified - PPO | Admitting: Family Medicine

## 2015-11-09 DIAGNOSIS — Z01419 Encounter for gynecological examination (general) (routine) without abnormal findings: Secondary | ICD-10-CM | POA: Diagnosis not present

## 2015-11-09 DIAGNOSIS — Z6839 Body mass index (BMI) 39.0-39.9, adult: Secondary | ICD-10-CM | POA: Diagnosis not present

## 2015-11-09 DIAGNOSIS — Z3040 Encounter for surveillance of contraceptives, unspecified: Secondary | ICD-10-CM | POA: Diagnosis not present

## 2015-11-09 DIAGNOSIS — Z124 Encounter for screening for malignant neoplasm of cervix: Secondary | ICD-10-CM | POA: Diagnosis not present

## 2015-11-09 DIAGNOSIS — N72 Inflammatory disease of cervix uteri: Secondary | ICD-10-CM | POA: Diagnosis not present

## 2015-11-09 LAB — HM PAP SMEAR: HM Pap smear: NORMAL

## 2016-01-04 DIAGNOSIS — K080 Exfoliation of teeth due to systemic causes: Secondary | ICD-10-CM | POA: Diagnosis not present

## 2016-02-05 DIAGNOSIS — K080 Exfoliation of teeth due to systemic causes: Secondary | ICD-10-CM | POA: Diagnosis not present

## 2016-02-14 ENCOUNTER — Other Ambulatory Visit: Payer: Self-pay | Admitting: Emergency Medicine

## 2016-02-14 DIAGNOSIS — Z0000 Encounter for general adult medical examination without abnormal findings: Secondary | ICD-10-CM

## 2016-02-26 ENCOUNTER — Ambulatory Visit (INDEPENDENT_AMBULATORY_CARE_PROVIDER_SITE_OTHER): Payer: Federal, State, Local not specified - PPO | Admitting: Family Medicine

## 2016-02-26 ENCOUNTER — Encounter: Payer: Self-pay | Admitting: Family Medicine

## 2016-02-26 VITALS — BP 118/82 | HR 86 | Temp 98.5°F | Ht 65.0 in | Wt 221.6 lb

## 2016-02-26 DIAGNOSIS — E6609 Other obesity due to excess calories: Secondary | ICD-10-CM | POA: Diagnosis not present

## 2016-02-26 DIAGNOSIS — Z0000 Encounter for general adult medical examination without abnormal findings: Secondary | ICD-10-CM | POA: Diagnosis not present

## 2016-02-26 DIAGNOSIS — Z23 Encounter for immunization: Secondary | ICD-10-CM

## 2016-02-26 DIAGNOSIS — I10 Essential (primary) hypertension: Secondary | ICD-10-CM

## 2016-02-26 DIAGNOSIS — Z68.41 Body mass index (BMI) pediatric, greater than or equal to 95th percentile for age: Secondary | ICD-10-CM

## 2016-02-26 LAB — BASIC METABOLIC PANEL
BUN: 17 mg/dL (ref 6–23)
CHLORIDE: 105 meq/L (ref 96–112)
CO2: 29 meq/L (ref 19–32)
Calcium: 9.6 mg/dL (ref 8.4–10.5)
Creatinine, Ser: 0.82 mg/dL (ref 0.40–1.20)
GFR: 99.83 mL/min (ref >60.00)
Glucose, Bld: 101 mg/dL — ABNORMAL HIGH (ref 70–99)
POTASSIUM: 4.4 meq/L (ref 3.5–5.1)
Sodium: 142 mEq/L (ref 135–145)

## 2016-02-26 LAB — CBC WITH DIFFERENTIAL/PLATELET
Basophils Absolute: 0 10*3/uL (ref 0.0–0.1)
Basophils Relative: 0.3 % (ref 0.0–3.0)
EOS PCT: 2.4 % (ref 0.0–5.0)
Eosinophils Absolute: 0.2 10*3/uL (ref 0.0–0.7)
HEMATOCRIT: 36 % (ref 36.0–46.0)
Hemoglobin: 11.8 g/dL — ABNORMAL LOW (ref 12.0–15.0)
LYMPHS ABS: 1.7 10*3/uL (ref 0.7–4.0)
LYMPHS PCT: 18.5 % (ref 12.0–46.0)
MCHC: 32.7 g/dL (ref 30.0–36.0)
MCV: 86.3 fl (ref 78.0–100.0)
MONOS PCT: 5.3 % (ref 3.0–12.0)
Monocytes Absolute: 0.5 10*3/uL (ref 0.1–1.0)
NEUTROS ABS: 6.6 10*3/uL (ref 1.4–7.7)
NEUTROS PCT: 73.5 % (ref 43.0–77.0)
PLATELETS: 321 10*3/uL (ref 150.0–400.0)
RBC: 4.18 Mil/uL (ref 3.87–5.11)
RDW: 13.8 % (ref 11.5–15.5)
WBC: 9 10*3/uL (ref 4.0–10.5)

## 2016-02-26 LAB — POC URINALSYSI DIPSTICK (AUTOMATED)
BILIRUBIN UA: NEGATIVE
GLUCOSE UA: NEGATIVE
KETONES UA: NEGATIVE
LEUKOCYTES UA: NEGATIVE
Nitrite, UA: NEGATIVE
PH UA: 6
Spec Grav, UA: 1.02
Urobilinogen, UA: 0.2

## 2016-02-26 LAB — HEPATIC FUNCTION PANEL
ALBUMIN: 4.3 g/dL (ref 3.5–5.2)
ALT: 17 U/L (ref 0–35)
AST: 15 U/L (ref 0–37)
Alkaline Phosphatase: 79 U/L (ref 39–117)
Bilirubin, Direct: 0.1 mg/dL (ref 0.0–0.3)
TOTAL PROTEIN: 7.1 g/dL (ref 6.0–8.3)
Total Bilirubin: 0.6 mg/dL (ref 0.2–1.2)

## 2016-02-26 LAB — TSH: TSH: 1.04 u[IU]/mL (ref 0.35–4.50)

## 2016-02-26 LAB — LIPID PANEL
CHOLESTEROL: 152 mg/dL (ref 0–200)
HDL: 43.7 mg/dL (ref >39.00)
LDL Cholesterol: 97 mg/dL (ref 0–99)
NonHDL: 108.07
TRIGLYCERIDES: 53 mg/dL (ref 0.0–149.0)
Total CHOL/HDL Ratio: 3
VLDL: 10.6 mg/dL (ref 0.0–40.0)

## 2016-02-26 MED ORDER — LISINOPRIL-HYDROCHLOROTHIAZIDE 10-12.5 MG PO TABS: 1.0000 | ORAL_TABLET | ORAL | 3 refills | Status: DC

## 2016-02-26 NOTE — Progress Notes (Signed)
Pre visit review using our clinic review tool, if applicable. No additional management support is needed unless otherwise documented below in the visit note. 

## 2016-02-26 NOTE — Progress Notes (Signed)
Michele [name] is a 40 year old married female nonsmoker who comes in today for general physical examination because of a history of hypertension and obesity  Her blood pressures well controlled with Zestoretic 10-12 0.5 daily. BP today 118/82.  Her weight is 2015 was 233 pounds she's down to 221 with diet and exercise. Her only complaint is some soreness in her right knee. No previous history of torn ligaments cartilage damage etc. She points to medial portion of her knee as a source of discomfort. No swelling or locking.  Back in 2015 we send her to plastic surgery because of massive breasts. She had a breast reduction is done well no complications.  She gets routine eye care, dental care, colonoscopy not until age 84. No family history of colon cancer polyps. Mammography at age 56. No family history of breast cancer.  She has a Mirena IUD. She sees her GYN yearly.  Vaccination history tetanus 2015 seasonal flu shot given today  14 point review of systems reviewed are negative  HEENT were negative neck was supple no adenopathy thyroid normal cardiopulmonary 9 exam normal. Breast exam shows scars previous reduction mammoplasty no palpable masses. Abdominal exam negative extremities normal skin normal peripheral pulses normal. Focus on the right knee. Ligaments intact cartilage normal.  Impression #1 hypertension ago continue current therapy  #2 obesity continue diet exercise weight loss  #3 soreness right knee continue weight loss Motrin ice.

## 2016-02-26 NOTE — Patient Instructions (Signed)
Continue daily blood pressure medicine  Continue daily exercise  For the soreness in your recommend taking 2 Motrin an hour before you walk and when you get back from walking elevate that leg and and ice it for 15 minutes.  Carbohydrate free diet  Return in one year sooner if any problems  When you call to make your appointment next July,,,,,,,,,,, Dr. Yong Channel or Dr. Maudie Mercury

## 2016-05-02 DIAGNOSIS — R1010 Upper abdominal pain, unspecified: Secondary | ICD-10-CM | POA: Diagnosis not present

## 2016-05-02 DIAGNOSIS — R1011 Right upper quadrant pain: Secondary | ICD-10-CM | POA: Diagnosis not present

## 2016-05-03 DIAGNOSIS — R1011 Right upper quadrant pain: Secondary | ICD-10-CM | POA: Diagnosis not present

## 2016-05-06 ENCOUNTER — Telehealth: Payer: Self-pay | Admitting: Family Medicine

## 2016-05-06 ENCOUNTER — Ambulatory Visit (INDEPENDENT_AMBULATORY_CARE_PROVIDER_SITE_OTHER): Payer: Federal, State, Local not specified - PPO | Admitting: Family Medicine

## 2016-05-06 ENCOUNTER — Telehealth: Payer: Self-pay | Admitting: *Deleted

## 2016-05-06 ENCOUNTER — Encounter: Payer: Self-pay | Admitting: Family Medicine

## 2016-05-06 VITALS — BP 110/76 | HR 81 | Temp 98.1°F | Ht 65.0 in | Wt 226.0 lb

## 2016-05-06 DIAGNOSIS — K8050 Calculus of bile duct without cholangitis or cholecystitis without obstruction: Secondary | ICD-10-CM | POA: Diagnosis not present

## 2016-05-06 DIAGNOSIS — R21 Rash and other nonspecific skin eruption: Secondary | ICD-10-CM | POA: Diagnosis not present

## 2016-05-06 DIAGNOSIS — K8000 Calculus of gallbladder with acute cholecystitis without obstruction: Secondary | ICD-10-CM

## 2016-05-06 LAB — CBC WITH DIFFERENTIAL/PLATELET
Basophils Absolute: 0 10*3/uL (ref 0.0–0.1)
Basophils Relative: 0.1 % (ref 0.0–3.0)
EOS PCT: 2.1 % (ref 0.0–5.0)
Eosinophils Absolute: 0.2 10*3/uL (ref 0.0–0.7)
HCT: 35 % — ABNORMAL LOW (ref 36.0–46.0)
Hemoglobin: 11.6 g/dL — ABNORMAL LOW (ref 12.0–15.0)
LYMPHS PCT: 17.4 % (ref 12.0–46.0)
Lymphs Abs: 1.9 10*3/uL (ref 0.7–4.0)
MCHC: 33.2 g/dL (ref 30.0–36.0)
MCV: 85.1 fl (ref 78.0–100.0)
MONO ABS: 0.7 10*3/uL (ref 0.1–1.0)
MONOS PCT: 6.5 % (ref 3.0–12.0)
Neutro Abs: 8 10*3/uL — ABNORMAL HIGH (ref 1.4–7.7)
Neutrophils Relative %: 73.9 % (ref 43.0–77.0)
Platelets: 335 10*3/uL (ref 150.0–400.0)
RBC: 4.12 Mil/uL (ref 3.87–5.11)
RDW: 14.1 % (ref 11.5–15.5)
WBC: 10.8 10*3/uL — ABNORMAL HIGH (ref 4.0–10.5)

## 2016-05-06 NOTE — Telephone Encounter (Signed)
Per Dr Maudie Mercury pt needed a STAT referral for general surgery.  I called [facility] and spoke with Rory Percy and she scheduled the pt an appt for Tuesday January 16 at 10:10am.  Patient was given written information about the appt.

## 2016-05-06 NOTE — Telephone Encounter (Signed)
Notes are on Dr [employer] desk and she is aware the pt has an appt today.

## 2016-05-06 NOTE — Progress Notes (Signed)
Pre visit review using our clinic review tool, if applicable. No additional management support is needed unless otherwise documented below in the visit note. 

## 2016-05-06 NOTE — Telephone Encounter (Signed)
Pt went to [facility]  [address], [place], [place] [address]  Phone: [phone]

## 2016-05-06 NOTE — Progress Notes (Signed)
HPI:  Michele [name]  A pleasant 41 yo with a PMH significant for obesity and hypertension here for an acute visit for RUQ pain. Symptoms started acutely 5 days ago with severe, crampy intermittent RUQ pain. She went to an [facility] on 05/03/16 and US showed acute cholecystitis. She did not have any lab work at that time. She reports she was told to go to the emergency room or to see a general surgeon, but she decided to come here instead. She reports that she had she feels much better today with resolution of her symptoms. She has not had any fevers, chills, vomiting, diarrhea, shortness of breath.  She also has a itchy rash on the left wrist that started when she wore a bit here. It has persisted for about 1 month. Denies any rash elsewhere. Daughter with eczema. ROS: See pertinent positives and negatives per HPI.  Past Medical History:  Diagnosis Date  . Hypertension    under control with med., has been on med. x 2 yr.  . Hypertrophy of breast 03/2013  . Obesity     Past Surgical History:  Procedure Laterality Date  . BREAST REDUCTION SURGERY Bilateral 04/26/2013   Procedure: BILATERAL BREAST REDUCTION WITH LIPOSUCTION;  Surgeon: Cristine Polio, MD;  Location: [place];  Service: Plastics;  Laterality: Bilateral;  . FOOT SURGERY  2003; 2004   each foot    Family History  Problem Relation Age of Onset  . Cancer Father     Prostate  . Heart disease Father   . Migraines Mother   . Cancer Paternal Grandmother     blood?  . Cancer Paternal Grandfather     prostate    Social History   Social History  . Marital status: Married    Spouse name: N/A  . Number of children: N/A  . Years of education: N/A   Social History Main Topics  . Smoking status: Never Smoker  . Smokeless tobacco: Never Used  . Alcohol use Yes     Comment: rarely  . Drug use: No  . Sexual activity: Yes    Birth control/ protection: IUD   Other Topics Concern  . None   Social History  Narrative  . None     Current Outpatient Prescriptions:  .  levonorgestrel (MIRENA) 20 MCG/24HR IUD, 1 each by Intrauterine route once., Disp: , Rfl:  .  lisinopril-hydrochlorothiazide (PRINZIDE,ZESTORETIC) 10-12.5 MG tablet, Take 1 tablet by mouth every morning., Disp: 90 tablet, Rfl: 3  EXAM:  Vitals:   05/06/16 1356  BP: 110/76  Pulse: 81  Temp: 98.1 F (36.7 C)    Body mass index is 37.61 kg/m.  GENERAL: vitals reviewed and listed above, alert, oriented, appears well hydrated and in no acute distress  HEENT: atraumatic, conjunttiva clear, no obvious abnormalities on inspection of external nose and ears  NECK: no obvious masses on inspection  LUNGS: clear to auscultation bilaterally, no wheezes, rales or rhonchi, good air movement  CV: HRRR, no peripheral edema  ABD: bowel sounds present in all 4 quadrants, Tenderness to palpation in the right upper quadrant, no rebound or guarding  MS: moves all extremities without noticeable abnormality  SKIN: patch of raised dry scaly skin on the left wrist  PSYCH: pleasant and cooperative, no obvious depression or anxiety  ASSESSMENT AND PLAN:  Discussed the following assessment and plan:  Biliary colic  Acute calculous cholecystitis - Plan: CBC with Differential/Platelets, CMP with eGFR, Ambulatory referral to General Surgery  Rash and nonspecific skin eruption  -we discussed possible serious and likely etiologies, workup and treatment, treatment risks and return precautions -after this discussion, Michele [name] opted for urgent referral to Gen. Surgery, labs today, emergency name precautions should her symptoms recur -follow up advised in 1 month for a new patient visit and to follow-up on her skin rash - Will try topical antifungal and steroid cream on the rash on the left wrist -of course, we advised Michele [name]  to return or notify a doctor immediately if symptoms worsen or persist or new concerns arise.    Patient  Instructions  BEFORE YOU LEAVE: -general surgery appointment information - STAT referral -labs  -We placed a referral for you as discussed to the general surgeon.  In the interim if your symptoms return, you have fevers, or you feel sick please go to the emergency room.     Colin Benton R., DO

## 2016-05-06 NOTE — Telephone Encounter (Signed)
Pt wants to make sure Dr Maudie Mercury has office notes from an UC visit from last Thursday. Advised pt to follow up with UC as well to make sure they sent from there end.

## 2016-05-06 NOTE — Patient Instructions (Signed)
BEFORE YOU LEAVE: -general surgery appointment information - STAT referral -labs  -We placed a referral for you as discussed to the general surgeon.  In the interim if your symptoms return, you have fevers, or you feel sick please go to the emergency room.

## 2016-05-07 LAB — COMPLETE METABOLIC PANEL WITH GFR
ALBUMIN: 4 g/dL (ref 3.6–5.1)
ALT: 16 U/L (ref 6–29)
AST: 12 U/L (ref 10–30)
Alkaline Phosphatase: 70 U/L (ref 33–115)
BUN: 9 mg/dL (ref 7–25)
CALCIUM: 9.3 mg/dL (ref 8.6–10.2)
CHLORIDE: 103 mmol/L (ref 98–110)
CO2: 25 mmol/L (ref 20–31)
CREATININE: 0.76 mg/dL (ref 0.50–1.10)
GFR, Est African American: >89 mL/min (ref >=60)
GFR, Est Non African American: >89 mL/min (ref >=60)
Glucose, Bld: 72 mg/dL (ref 65–99)
Potassium: 4.3 mmol/L (ref 3.5–5.3)
Sodium: 140 mmol/L (ref 135–146)
Total Bilirubin: 0.6 mg/dL (ref 0.2–1.2)
Total Protein: 7.1 g/dL (ref 6.1–8.1)

## 2016-05-09 ENCOUNTER — Encounter (HOSPITAL_COMMUNITY): Payer: Self-pay

## 2016-05-09 ENCOUNTER — Inpatient Hospital Stay (HOSPITAL_COMMUNITY)
Admission: EM | Admit: 2016-05-09 | Discharge: 2016-05-14 | DRG: 418 | Disposition: A | Discharge status: Home / Self Care | Payer: Federal, State, Local not specified - PPO | Attending: Surgery | Admitting: Surgery

## 2016-05-09 DIAGNOSIS — K8066 Calculus of gallbladder and bile duct with acute and chronic cholecystitis without obstruction: Secondary | ICD-10-CM | POA: Diagnosis not present

## 2016-05-09 DIAGNOSIS — Z8042 Family history of malignant neoplasm of prostate: Secondary | ICD-10-CM | POA: Diagnosis not present

## 2016-05-09 DIAGNOSIS — M549 Dorsalgia, unspecified: Secondary | ICD-10-CM | POA: Diagnosis not present

## 2016-05-09 DIAGNOSIS — K8000 Calculus of gallbladder with acute cholecystitis without obstruction: Secondary | ICD-10-CM | POA: Diagnosis not present

## 2016-05-09 DIAGNOSIS — K8012 Calculus of gallbladder with acute and chronic cholecystitis without obstruction: Secondary | ICD-10-CM | POA: Diagnosis not present

## 2016-05-09 DIAGNOSIS — Z8249 Family history of ischemic heart disease and other diseases of the circulatory system: Secondary | ICD-10-CM

## 2016-05-09 DIAGNOSIS — I10 Essential (primary) hypertension: Secondary | ICD-10-CM | POA: Diagnosis not present

## 2016-05-09 DIAGNOSIS — K810 Acute cholecystitis: Secondary | ICD-10-CM

## 2016-05-09 DIAGNOSIS — K567 Ileus, unspecified: Secondary | ICD-10-CM | POA: Diagnosis present

## 2016-05-09 DIAGNOSIS — E669 Obesity, unspecified: Secondary | ICD-10-CM | POA: Diagnosis not present

## 2016-05-09 DIAGNOSIS — R1011 Right upper quadrant pain: Secondary | ICD-10-CM | POA: Diagnosis not present

## 2016-05-09 DIAGNOSIS — Z6836 Body mass index (BMI) 36.0-36.9, adult: Secondary | ICD-10-CM | POA: Diagnosis not present

## 2016-05-09 DIAGNOSIS — Z0389 Encounter for observation for other suspected diseases and conditions ruled out: Secondary | ICD-10-CM | POA: Diagnosis not present

## 2016-05-09 DIAGNOSIS — K8020 Calculus of gallbladder without cholecystitis without obstruction: Secondary | ICD-10-CM | POA: Diagnosis not present

## 2016-05-09 DIAGNOSIS — N62 Hypertrophy of breast: Secondary | ICD-10-CM | POA: Diagnosis not present

## 2016-05-09 DIAGNOSIS — Z419 Encounter for procedure for purposes other than remedying health state, unspecified: Secondary | ICD-10-CM

## 2016-05-09 HISTORY — DX: Calculus of gallbladder with acute cholecystitis without obstruction: K80.00

## 2016-05-09 LAB — COMPREHENSIVE METABOLIC PANEL
ALBUMIN: 3.8 g/dL (ref 3.5–5.0)
ALK PHOS: 81 U/L (ref 38–126)
ALT: 17 U/L (ref 14–54)
AST: 20 U/L (ref 15–41)
Anion gap: 11 (ref 5–15)
BILIRUBIN TOTAL: 1 mg/dL (ref 0.3–1.2)
BUN: 6 mg/dL (ref 6–20)
CALCIUM: 9.7 mg/dL (ref 8.9–10.3)
CO2: 23 mmol/L (ref 22–32)
CREATININE: 0.75 mg/dL (ref 0.44–1.00)
Chloride: 102 mmol/L (ref 101–111)
GFR calc Af Amer: >60 mL/min (ref >60)
GFR calc non Af Amer: >60 mL/min (ref >60)
GLUCOSE: 104 mg/dL — AB (ref 65–99)
Potassium: 3.7 mmol/L (ref 3.5–5.1)
Sodium: 136 mmol/L (ref 135–145)
TOTAL PROTEIN: 7.8 g/dL (ref 6.5–8.1)

## 2016-05-09 LAB — URINALYSIS, ROUTINE W REFLEX MICROSCOPIC
Bilirubin Urine: NEGATIVE
GLUCOSE, UA: NEGATIVE mg/dL
Ketones, ur: NEGATIVE mg/dL
Leukocytes, UA: NEGATIVE
Nitrite: NEGATIVE
PH: 5 (ref 5.0–8.0)
Protein, ur: NEGATIVE mg/dL
SPECIFIC GRAVITY, URINE: 1.01 (ref 1.005–1.030)

## 2016-05-09 LAB — CBC
HCT: 37.7 % (ref 36.0–46.0)
HEMOGLOBIN: 12.2 g/dL (ref 12.0–15.0)
MCH: 27.7 pg (ref 26.0–34.0)
MCHC: 32.4 g/dL (ref 30.0–36.0)
MCV: 85.7 fL (ref 78.0–100.0)
PLATELETS: 396 10*3/uL (ref 150–400)
RBC: 4.4 MIL/uL (ref 3.87–5.11)
RDW: 13.3 % (ref 11.5–15.5)
WBC: 17.1 10*3/uL — ABNORMAL HIGH (ref 4.0–10.5)

## 2016-05-09 LAB — PREGNANCY, URINE: Preg Test, Ur: NEGATIVE

## 2016-05-09 LAB — LIPASE, BLOOD: Lipase: 20 U/L (ref 11–51)

## 2016-05-09 MED ORDER — LISINOPRIL-HYDROCHLOROTHIAZIDE 10-12.5 MG PO TABS: 1.0000 | ORAL_TABLET | ORAL | Status: DC

## 2016-05-09 MED ORDER — HYDROMORPHONE HCL 2 MG/ML IJ SOLN
1.0000 mg | INTRAMUSCULAR | Status: DC | PRN
  Administered 2016-05-09 – 2016-05-11 (×4): 1 mg via INTRAVENOUS
  Filled 2016-05-09 (×4): qty 1

## 2016-05-09 MED ORDER — ONDANSETRON HCL 4 MG/2ML IJ SOLN: 4.0000 mg | Freq: Four times a day (QID) | INTRAMUSCULAR | Status: DC | PRN

## 2016-05-09 MED ORDER — ONDANSETRON 4 MG PO TBDP: 4.0000 mg | ORAL_TABLET | Freq: Four times a day (QID) | ORAL | Status: DC | PRN

## 2016-05-09 MED ORDER — ENOXAPARIN SODIUM 40 MG/0.4ML ~~LOC~~ SOLN
40.0000 mg | SUBCUTANEOUS | Status: DC
  Administered 2016-05-09 – 2016-05-13 (×5): 40 mg via SUBCUTANEOUS
  Filled 2016-05-09 (×5): qty 0.4

## 2016-05-09 MED ORDER — LISINOPRIL 10 MG PO TABS
10.0000 mg | ORAL_TABLET | Freq: Every day | ORAL | Status: DC
  Administered 2016-05-10 – 2016-05-14 (×5): 10 mg via ORAL
  Filled 2016-05-09 (×5): qty 1

## 2016-05-09 MED ORDER — POTASSIUM CHLORIDE IN NACL 20-0.9 MEQ/L-% IV SOLN
INTRAVENOUS | Status: DC
  Administered 2016-05-09 – 2016-05-12 (×5): via INTRAVENOUS
  Filled 2016-05-09 (×6): qty 1000

## 2016-05-09 MED ORDER — DEXTROSE 5 % IV SOLN
2.0000 g | INTRAVENOUS | Status: DC
  Administered 2016-05-09 – 2016-05-12 (×5): 2 g via INTRAVENOUS
  Filled 2016-05-09 (×4): qty 2

## 2016-05-09 MED ORDER — SODIUM CHLORIDE 0.9 % IV BOLUS (SEPSIS)
1000.0000 mL | Freq: Once | INTRAVENOUS | Status: AC
  Administered 2016-05-09: 1000 mL via INTRAVENOUS

## 2016-05-09 MED ORDER — HYDROCHLOROTHIAZIDE 12.5 MG PO CAPS
12.5000 mg | ORAL_CAPSULE | Freq: Every day | ORAL | Status: DC
  Administered 2016-05-10 – 2016-05-14 (×5): 12.5 mg via ORAL
  Filled 2016-05-09 (×5): qty 1

## 2016-05-09 MED ORDER — MORPHINE SULFATE (PF) 4 MG/ML IV SOLN
4.0000 mg | Freq: Once | INTRAVENOUS | Status: AC
  Administered 2016-05-09: 4 mg via INTRAVENOUS
  Filled 2016-05-09: qty 1

## 2016-05-09 NOTE — H&P (Signed)
Michele [name] is an 41 y.o. female.   Chief Complaint: Right upper quadrant abdominal pain HPI: This is a pleasant 41 year old female who presents with a one-week history of intermittent epigastric and right upper quadrant abdominal pain. The pain started last Thursday. It came on suddenly. It was sharp and moderately severe. She went to an urgent care on Thursday. An ultrasound was ordered for Friday morning. She improved over the weekend and then saw her primary care physician on Monday and was scheduled to see one of our surgeons next week. She was feeling better until today when the pain recurred. It remains moderately sharp in the epigastrium and right upper quadrant.  Again, there is no nausea or vomiting.  She had an ultrasound done at [facility].  I have the report showing cholelithiasis with a stone in the gallbladder neck. There is also gallbladder wall thickening and pericholecystic fluid. Her liver function tests are normal but her white blood count is elevated.  Past Medical History:  Diagnosis Date  . Hypertension    under control with med., has been on med. x 2 yr.  . Hypertrophy of breast 03/2013  . Obesity     Past Surgical History:  Procedure Laterality Date  . BREAST REDUCTION SURGERY Bilateral 04/26/2013   Procedure: BILATERAL BREAST REDUCTION WITH LIPOSUCTION;  Surgeon: Cristine Polio, MD;  Location: [place];  Service: Plastics;  Laterality: Bilateral;  . FOOT SURGERY  2003; 2004   each foot    Family History  Problem Relation Age of Onset  . Cancer Father     Prostate  . Heart disease Father   . Migraines Mother   . Cancer Paternal Grandmother     blood?  . Cancer Paternal Grandfather     prostate   Social History:  reports that she has never smoked. She has never used smokeless tobacco. She reports that she drinks alcohol. She reports that she does not use drugs.  Allergies: No Known Allergies   (Not in a hospital admission)  Results  for orders placed or performed during the hospital encounter of 05/09/16 (from the past 48 hour(s))  Lipase, blood     Status: None   Collection Time: 05/09/16  2:48 PM  Result Value Ref Range   Lipase 20 11 - 51 U/L  Comprehensive metabolic panel     Status: Abnormal   Collection Time: 05/09/16  2:48 PM  Result Value Ref Range   Sodium 136 135 - 145 mmol/L   Potassium 3.7 3.5 - 5.1 mmol/L   Chloride 102 101 - 111 mmol/L   CO2 23 22 - 32 mmol/L   Glucose, Bld 104 (H) 65 - 99 mg/dL   BUN 6 6 - 20 mg/dL   Creatinine, Ser 0.75 0.44 - 1.00 mg/dL   Calcium 9.7 8.9 - 10.3 mg/dL   Total Protein 7.8 6.5 - 8.1 g/dL   Albumin 3.8 3.5 - 5.0 g/dL   AST 20 15 - 41 U/L   ALT 17 14 - 54 U/L   Alkaline Phosphatase 81 38 - 126 U/L   Total Bilirubin 1.0 0.3 - 1.2 mg/dL   GFR calc non Af Amer >60 >60 mL/min   GFR calc Af Amer >60 >60 mL/min    Comment: (NOTE) The eGFR has been calculated using the CKD EPI equation. This calculation has not been validated in all clinical situations. eGFR's persistently <60 mL/min signify possible Chronic Kidney Disease.    Anion gap 11 5 - 15  CBC     Status: Abnormal   Collection Time: 05/09/16  2:48 PM  Result Value Ref Range   WBC 17.1 (H) 4.0 - 10.5 K/uL   RBC 4.40 3.87 - 5.11 MIL/uL   Hemoglobin 12.2 12.0 - 15.0 g/dL   HCT 37.7 36.0 - 46.0 %   MCV 85.7 78.0 - 100.0 fL   MCH 27.7 26.0 - 34.0 pg   MCHC 32.4 30.0 - 36.0 g/dL   RDW 13.3 11.5 - 15.5 %   Platelets 396 150 - 400 K/uL  Urinalysis, Routine w reflex microscopic     Status: Abnormal   Collection Time: 05/09/16  5:03 PM  Result Value Ref Range   Color, Urine YELLOW YELLOW   APPearance CLEAR CLEAR   Specific Gravity, Urine 1.010 1.005 - 1.030   pH 5.0 5.0 - 8.0   Glucose, UA NEGATIVE NEGATIVE mg/dL   Hgb urine dipstick LARGE (A) NEGATIVE   Bilirubin Urine NEGATIVE NEGATIVE   Ketones, ur NEGATIVE NEGATIVE mg/dL   Protein, ur NEGATIVE NEGATIVE mg/dL   Nitrite NEGATIVE NEGATIVE   Leukocytes,  UA NEGATIVE NEGATIVE   RBC / HPF 0-5 0 - 5 RBC/hpf   WBC, UA 0-5 0 - 5 WBC/hpf   Bacteria, UA RARE (A) NONE SEEN   Squamous Epithelial / LPF 0-5 (A) NONE SEEN   Mucous PRESENT   Pregnancy, urine     Status: None   Collection Time: 05/09/16  6:28 PM  Result Value Ref Range   Preg Test, Ur NEGATIVE NEGATIVE    Comment:        THE SENSITIVITY OF THIS METHODOLOGY IS >20 mIU/mL.    No results found.  Review of Systems  Constitutional: Negative for chills, diaphoresis and fever.  HENT: Negative for congestion and sore throat.   Respiratory: Negative for cough and shortness of breath.   Cardiovascular: Negative for chest pain and leg swelling.  Gastrointestinal: Positive for abdominal pain. Negative for diarrhea, nausea and vomiting.  Genitourinary: Negative for dysuria and urgency.  Skin: Negative for rash.  All other systems reviewed and are negative.   Blood pressure 123/74, pulse 94, temperature 99.7 F (37.6 C), temperature source Oral, resp. rate 16, SpO2 99 %. Physical Exam  Constitutional: She is oriented to person, place, and time. She appears well-developed and well-nourished. No distress.  HENT:  Head: Normocephalic and atraumatic.  Right Ear: External ear normal.  Left Ear: External ear normal.  Nose: Nose normal.  Mouth/Throat: Oropharynx is clear and moist. No oropharyngeal exudate.  Eyes: Conjunctivae are normal. Pupils are equal, round, and reactive to light. Right eye exhibits no discharge. Left eye exhibits no discharge. No scleral icterus.  Neck: Normal range of motion. No tracheal deviation present. No thyromegaly present.  Cardiovascular: Normal rate, regular rhythm, normal heart sounds and intact distal pulses.   No murmur heard. Respiratory: Effort normal and breath sounds normal. No respiratory distress. She has no wheezes.  GI: Soft. Bowel sounds are normal. She exhibits no distension. There is tenderness. There is guarding.  There is tenderness with  guarding in the right upper quadrant  Musculoskeletal: Normal range of motion. She exhibits no edema or tenderness.  Lymphadenopathy:    She has no cervical adenopathy.  Neurological: She is alert and oriented to person, place, and time.  Skin: Skin is warm and dry. No rash noted. She is not diaphoretic. No erythema.  Psychiatric: Her behavior is normal. Judgment normal.     Assessment/Plan Acute cholecystitis with cholelithiasis  I discussed the diagnosis with her in detail. Our recommendation is to be admitted to the hospital for IV antibiotics and laparoscopic cholecystectomy hopefully within the next 24 hours. I briefly discussed the surgical procedure with her. She understands and agrees to proceed with the plans.  Harl Bowie, MD 05/09/2016, 8:24 PM

## 2016-05-09 NOTE — ED Provider Notes (Signed)
[facility] DEPT Provider Note   CSN: [id number] Arrival date & time: 05/09/16  1352     History   Chief Complaint Chief Complaint  Patient presents with  . Abdominal Pain    HPI Michele [name] is a 41 y.o. female.  The history is provided by the patient.  Abdominal Pain   This is a new problem. Episode onset: 4-8 days. Episode frequency: intermittent. The problem has not changed since onset.The pain is located in the epigastric region and RUQ. The quality of the pain is aching and pressure-like. The pain is moderate. Pertinent negatives include anorexia, fever, diarrhea, hematochezia, melena, nausea, vomiting, constipation, dysuria, frequency and headaches. The symptoms are aggravated by eating. Nothing relieves the symptoms. Past workup includes ultrasound. Her past medical history is significant for gallstones.   RUQ [place] 5 days ago noted cholelithiasis and inflammation. Scheduled to see surgery (Dr. Hulen Skains) on 05/15/15.  Past Medical History:  Diagnosis Date  . Hypertension    under control with med., has been on med. x 2 yr.  . Hypertrophy of breast 03/2013  . Obesity     Patient Active Problem List   Diagnosis Date Noted  . Cholecystitis, acute with cholelithiasis 05/09/2016  . Gynecomastia, female 02/03/2013  . Back pain, acute 06/22/2012  . Obesity 05/25/2010  . Essential hypertension 05/25/2010    Past Surgical History:  Procedure Laterality Date  . BREAST REDUCTION SURGERY Bilateral 04/26/2013   Procedure: BILATERAL BREAST REDUCTION WITH LIPOSUCTION;  Surgeon: Cristine Polio, MD;  Location: [place];  Service: Plastics;  Laterality: Bilateral;  . FOOT SURGERY  2003; 2004   each foot    OB History    Gravida Para Term Preterm AB Living   2 2 1          SAB TAB Ectopic Multiple Live Births                   Home Medications    Prior to Admission medications   Medication Sig Start Date End Date Taking? Authorizing Provider    calcium carbonate (TUMS - DOSED IN MG ELEMENTAL CALCIUM) 500 MG chewable tablet Chew 1 tablet by mouth daily as needed for indigestion or heartburn.   Yes Historical Provider, MD  levonorgestrel (MIRENA) 20 MCG/24HR IUD 1 each by Intrauterine route once.   Yes Historical Provider, MD  lisinopril-hydrochlorothiazide (PRINZIDE,ZESTORETIC) 10-12.5 MG tablet Take 1 tablet by mouth every morning. 02/26/16  Yes Dorena Cookey, MD  magnesium citrate SOLN Take 1 Bottle by mouth once.   Yes Historical Provider, MD    Family History Family History  Problem Relation Age of Onset  . Cancer Father     Prostate  . Heart disease Father   . Migraines Mother   . Cancer Paternal Grandmother     blood?  . Cancer Paternal Grandfather     prostate    Social History Social History  Substance Use Topics  . Smoking status: Never Smoker  . Smokeless tobacco: Never Used  . Alcohol use Yes     Comment: rarely     Allergies   Patient has no known allergies.   Review of Systems Review of Systems  Constitutional: Negative for fever.  Gastrointestinal: Positive for abdominal pain. Negative for anorexia, constipation, diarrhea, hematochezia, melena, nausea and vomiting.  Genitourinary: Negative for dysuria and frequency.  Neurological: Negative for headaches.  Ten systems are reviewed and are negative for acute change except as noted in the HPI  Physical Exam Updated Vital Signs BP 123/74   Pulse 94   Temp 99.7 F (37.6 C) (Oral)   Resp 16   SpO2 99%   Physical Exam  Constitutional: She is oriented to person, place, and time. She appears well-developed and well-nourished. No distress.  HENT:  Head: Normocephalic and atraumatic.  Nose: Nose normal.  Eyes: Conjunctivae and EOM are normal. Pupils are equal, round, and reactive to light. Right eye exhibits no discharge. Left eye exhibits no discharge. No scleral icterus.  Neck: Normal range of motion. Neck supple.  Cardiovascular: Normal  rate and regular rhythm.  Exam reveals no gallop and no friction rub.   No murmur heard. Pulmonary/Chest: Effort normal and breath sounds normal. No stridor. No respiratory distress. She has no rales.  Abdominal: Soft. She exhibits no distension. There is tenderness in the right upper quadrant and epigastric area. There is positive Murphy's sign. There is no rigidity, no rebound, no guarding and no CVA tenderness.  Musculoskeletal: She exhibits no edema or tenderness.  Neurological: She is alert and oriented to person, place, and time.  Skin: Skin is warm and dry. No rash noted. She is not diaphoretic. No erythema.  Psychiatric: She has a normal mood and affect.  Vitals reviewed.    ED Treatments / Results  Labs (all labs ordered are listed, but only abnormal results are displayed) Labs Reviewed  COMPREHENSIVE METABOLIC PANEL - Abnormal; Notable for the following:       Result Value   Glucose, Bld 104 (*)    All other components within normal limits  CBC - Abnormal; Notable for the following:    WBC 17.1 (*)    All other components within normal limits  URINALYSIS, ROUTINE W REFLEX MICROSCOPIC - Abnormal; Notable for the following:    Hgb urine dipstick LARGE (*)    Bacteria, UA RARE (*)    Squamous Epithelial / LPF 0-5 (*)    All other components within normal limits  LIPASE, BLOOD  PREGNANCY, URINE    EKG  EKG Interpretation None       Radiology No results found.  Procedures Procedures (including critical care time)  Medications Ordered in ED Medications  sodium chloride 0.9 % bolus 1,000 mL (1,000 mLs Intravenous New Bag/Given 05/09/16 2001)  morphine 4 MG/ML injection 4 mg (4 mg Intravenous Given 05/09/16 2001)     Initial Impression / Assessment and Plan / ED Course  I have reviewed the triage vital signs and the nursing notes.  Pertinent labs & imaging results that were available during my care of the patient were reviewed by me and considered in my medical  decision making (see chart for details).  Clinical Course as of May 09 2041  Thu May 09, 2016  [id number] Presentation concerning for acute cholecystitis given the increase in WBC with + murphy's sign and previous US findings.  [PC]  1900 Consulted EGS who will evaluated the pt in the ED  [PC]  2043 EGS to admit  [PC]    Clinical Course User Index [PC] Fatima Blank, MD      Final Clinical Impressions(s) / ED Diagnoses   Final diagnoses:  Acute cholecystitis      Fatima Blank, MD 05/09/16 2044

## 2016-05-09 NOTE — ED Triage Notes (Signed)
Pt reports abd pain X1 week. She was told it was her gall bladder per ultrasound. Scheduled to see surgeon next Tuesday but here today due to increased pain.

## 2016-05-10 ENCOUNTER — Observation Stay (HOSPITAL_COMMUNITY): Payer: Federal, State, Local not specified - PPO

## 2016-05-10 ENCOUNTER — Observation Stay (HOSPITAL_COMMUNITY): Payer: Federal, State, Local not specified - PPO | Admitting: Certified Registered"

## 2016-05-10 ENCOUNTER — Encounter (HOSPITAL_COMMUNITY): Admission: EM | Disposition: A | Discharge status: Home / Self Care | Payer: Self-pay | Source: Home / Self Care

## 2016-05-10 ENCOUNTER — Encounter (HOSPITAL_COMMUNITY): Payer: Self-pay | Admitting: Anesthesiology

## 2016-05-10 DIAGNOSIS — K8066 Calculus of gallbladder and bile duct with acute and chronic cholecystitis without obstruction: Secondary | ICD-10-CM | POA: Diagnosis not present

## 2016-05-10 DIAGNOSIS — N62 Hypertrophy of breast: Secondary | ICD-10-CM | POA: Diagnosis not present

## 2016-05-10 DIAGNOSIS — K8012 Calculus of gallbladder with acute and chronic cholecystitis without obstruction: Secondary | ICD-10-CM | POA: Diagnosis not present

## 2016-05-10 DIAGNOSIS — M549 Dorsalgia, unspecified: Secondary | ICD-10-CM | POA: Diagnosis not present

## 2016-05-10 DIAGNOSIS — Z0389 Encounter for observation for other suspected diseases and conditions ruled out: Secondary | ICD-10-CM | POA: Diagnosis not present

## 2016-05-10 DIAGNOSIS — K8000 Calculus of gallbladder with acute cholecystitis without obstruction: Secondary | ICD-10-CM | POA: Diagnosis not present

## 2016-05-10 DIAGNOSIS — I10 Essential (primary) hypertension: Secondary | ICD-10-CM | POA: Diagnosis not present

## 2016-05-10 HISTORY — PX: CHOLECYSTECTOMY: SHX55

## 2016-05-10 LAB — CBC
HEMATOCRIT: 34.4 % — AB (ref 36.0–46.0)
HEMOGLOBIN: 11.2 g/dL — AB (ref 12.0–15.0)
MCH: 28 pg (ref 26.0–34.0)
MCHC: 32.6 g/dL (ref 30.0–36.0)
MCV: 86 fL (ref 78.0–100.0)
Platelets: 346 10*3/uL (ref 150–400)
RBC: 4 MIL/uL (ref 3.87–5.11)
RDW: 13.5 % (ref 11.5–15.5)
WBC: 14.3 10*3/uL — AB (ref 4.0–10.5)

## 2016-05-10 LAB — SURGICAL PCR SCREEN
MRSA, PCR: NEGATIVE
STAPHYLOCOCCUS AUREUS: NEGATIVE

## 2016-05-10 SURGERY — LAPAROSCOPIC CHOLECYSTECTOMY WITH INTRAOPERATIVE CHOLANGIOGRAM
Anesthesia: General | Site: Abdomen

## 2016-05-10 MED ORDER — ROCURONIUM BROMIDE 100 MG/10ML IV SOLN
INTRAVENOUS | Status: DC | PRN
  Administered 2016-05-10 (×2): 10 mg via INTRAVENOUS
  Administered 2016-05-10: 30 mg via INTRAVENOUS

## 2016-05-10 MED ORDER — PROPOFOL 10 MG/ML IV BOLUS
INTRAVENOUS | Status: DC | PRN
  Administered 2016-05-10: 200 mg via INTRAVENOUS

## 2016-05-10 MED ORDER — MEPERIDINE HCL 25 MG/ML IJ SOLN: 6.2500 mg | INTRAMUSCULAR | Status: DC | PRN

## 2016-05-10 MED ORDER — GLYCOPYRROLATE 0.2 MG/ML IJ SOLN
INTRAMUSCULAR | Status: DC | PRN
  Administered 2016-05-10: 0.3 mg via INTRAVENOUS

## 2016-05-10 MED ORDER — NEOSTIGMINE METHYLSULFATE 10 MG/10ML IV SOLN
INTRAVENOUS | Status: DC | PRN
  Administered 2016-05-10: 3 mg via INTRAVENOUS

## 2016-05-10 MED ORDER — ESMOLOL HCL 100 MG/10ML IV SOLN
INTRAVENOUS | Status: DC | PRN
  Administered 2016-05-10: 30 mg via INTRAVENOUS

## 2016-05-10 MED ORDER — PROPOFOL 10 MG/ML IV BOLUS
INTRAVENOUS | Status: AC
  Filled 2016-05-10: qty 20

## 2016-05-10 MED ORDER — LACTATED RINGERS IV SOLN
INTRAVENOUS | Status: DC | PRN
  Administered 2016-05-10 (×2): via INTRAVENOUS

## 2016-05-10 MED ORDER — HYDROMORPHONE HCL 1 MG/ML IJ SOLN
INTRAMUSCULAR | Status: AC
  Filled 2016-05-10: qty 0.5

## 2016-05-10 MED ORDER — HEMOSTATIC AGENTS (NO CHARGE) OPTIME
TOPICAL | Status: DC | PRN
  Administered 2016-05-10 (×2): 1 via TOPICAL

## 2016-05-10 MED ORDER — OXYCODONE-ACETAMINOPHEN 5-325 MG PO TABS
1.0000 | ORAL_TABLET | ORAL | Status: DC | PRN
  Administered 2016-05-10 – 2016-05-14 (×13): 1 via ORAL
  Filled 2016-05-10 (×13): qty 1

## 2016-05-10 MED ORDER — ONDANSETRON HCL 4 MG/2ML IJ SOLN: 4.0000 mg | Freq: Once | INTRAMUSCULAR | Status: DC | PRN

## 2016-05-10 MED ORDER — MIDAZOLAM HCL 2 MG/2ML IJ SOLN
INTRAMUSCULAR | Status: AC
  Filled 2016-05-10: qty 2

## 2016-05-10 MED ORDER — SODIUM CHLORIDE 0.9 % IR SOLN
Status: DC | PRN
  Administered 2016-05-10 (×2): 1000 mL

## 2016-05-10 MED ORDER — FENTANYL CITRATE (PF) 100 MCG/2ML IJ SOLN
INTRAMUSCULAR | Status: AC
  Filled 2016-05-10: qty 2

## 2016-05-10 MED ORDER — LIDOCAINE HCL (CARDIAC) 20 MG/ML IV SOLN
INTRAVENOUS | Status: DC | PRN
  Administered 2016-05-10: 100 mg via INTRAVENOUS

## 2016-05-10 MED ORDER — HYDROMORPHONE HCL 1 MG/ML IJ SOLN
0.2500 mg | INTRAMUSCULAR | Status: DC | PRN
  Administered 2016-05-10: 0.5 mg via INTRAVENOUS

## 2016-05-10 MED ORDER — FENTANYL CITRATE (PF) 100 MCG/2ML IJ SOLN
INTRAMUSCULAR | Status: DC | PRN
  Administered 2016-05-10: 50 ug via INTRAVENOUS
  Administered 2016-05-10: 100 ug via INTRAVENOUS
  Administered 2016-05-10: 50 ug via INTRAVENOUS
  Administered 2016-05-10: 100 ug via INTRAVENOUS
  Administered 2016-05-10 (×2): 50 ug via INTRAVENOUS

## 2016-05-10 MED ORDER — 0.9 % SODIUM CHLORIDE (POUR BTL) OPTIME
TOPICAL | Status: DC | PRN
  Administered 2016-05-10: 1000 mL

## 2016-05-10 MED ORDER — ONDANSETRON HCL 4 MG/2ML IJ SOLN
INTRAMUSCULAR | Status: DC | PRN
Start: 2016-05-10 — End: 2016-05-10
  Administered 2016-05-10: 4 mg via INTRAVENOUS

## 2016-05-10 MED ORDER — BUPIVACAINE-EPINEPHRINE 0.25% -1:200000 IJ SOLN
INTRAMUSCULAR | Status: DC | PRN
  Administered 2016-05-10: 10 mL

## 2016-05-10 MED ORDER — FENTANYL CITRATE (PF) 100 MCG/2ML IJ SOLN
INTRAMUSCULAR | Status: AC
  Filled 2016-05-10: qty 4

## 2016-05-10 MED ORDER — LACTATED RINGERS IV SOLN
INTRAVENOUS | Status: DC
  Administered 2016-05-10 (×2): via INTRAVENOUS

## 2016-05-10 MED ORDER — SODIUM CHLORIDE 0.9 % IV SOLN
INTRAVENOUS | Status: DC | PRN
  Administered 2016-05-10: 5 mL

## 2016-05-10 MED ORDER — MIDAZOLAM HCL 5 MG/5ML IJ SOLN
INTRAMUSCULAR | Status: DC | PRN
  Administered 2016-05-10: 2 mg via INTRAVENOUS

## 2016-05-10 MED ORDER — DEXAMETHASONE SODIUM PHOSPHATE 10 MG/ML IJ SOLN
INTRAMUSCULAR | Status: DC | PRN
  Administered 2016-05-10: 10 mg via INTRAVENOUS

## 2016-05-10 SURGICAL SUPPLY — 50 items
APL SKNCLS STERI-STRIP NONHPOA (GAUZE/BANDAGES/DRESSINGS) ×1
APPLIER CLIP ROT 10 11.4 M/L (STAPLE) ×2
APR CLP MED LRG 11.4X10 (STAPLE) ×1
BAG SPEC RTRVL 10 TROC 200 (ENDOMECHANICALS) ×1
BAG SPEC RTRVL LRG 6X4 10 (ENDOMECHANICALS) ×1
BENZOIN TINCTURE PRP APPL 2/3 (GAUZE/BANDAGES/DRESSINGS) ×2 IMPLANT
BLADE SURG ROTATE 9660 (MISCELLANEOUS) IMPLANT
CANISTER SUCTION 2500CC (MISCELLANEOUS) ×2 IMPLANT
CHLORAPREP W/TINT 26ML (MISCELLANEOUS) ×2 IMPLANT
CLIP APPLIE ROT 10 11.4 M/L (STAPLE) ×1 IMPLANT
COVER MAYO STAND STRL (DRAPES) ×2 IMPLANT
COVER SURGICAL LIGHT HANDLE (MISCELLANEOUS) ×2 IMPLANT
DRAIN CHANNEL 19F RND (DRAIN) ×1 IMPLANT
DRAPE C-ARM 42X72 X-RAY (DRAPES) ×2 IMPLANT
DRSG TEGADERM 2-3/8X2-3/4 SM (GAUZE/BANDAGES/DRESSINGS) ×6 IMPLANT
DRSG TEGADERM 4X4.75 (GAUZE/BANDAGES/DRESSINGS) ×2 IMPLANT
ELECT REM PT RETURN 9FT ADLT (ELECTROSURGICAL) ×2
ELECTRODE REM PT RTRN 9FT ADLT (ELECTROSURGICAL) ×1 IMPLANT
EVACUATOR SILICONE 100CC (DRAIN) ×1 IMPLANT
FILTER SMOKE EVAC LAPAROSHD (FILTER) ×2 IMPLANT
GAUZE SPONGE 2X2 8PLY STRL LF (GAUZE/BANDAGES/DRESSINGS) ×1 IMPLANT
GLOVE BIO SURGEON STRL SZ7 (GLOVE) ×2 IMPLANT
GLOVE BIOGEL PI IND STRL 7.5 (GLOVE) ×1 IMPLANT
GLOVE BIOGEL PI INDICATOR 7.5 (GLOVE) ×1
GOWN STRL REUS W/ TWL LRG LVL3 (GOWN DISPOSABLE) ×3 IMPLANT
GOWN STRL REUS W/TWL LRG LVL3 (GOWN DISPOSABLE) ×6
HEMOSTAT SNOW SURGICEL 2X4 (HEMOSTASIS) ×2 IMPLANT
KIT BASIN OR (CUSTOM PROCEDURE TRAY) ×2 IMPLANT
KIT ROOM TURNOVER OR (KITS) ×2 IMPLANT
NS IRRIG 1000ML POUR BTL (IV SOLUTION) ×2 IMPLANT
PAD ARMBOARD 7.5X6 YLW CONV (MISCELLANEOUS) ×2 IMPLANT
POUCH RETRIEVAL ECOSAC 10 (ENDOMECHANICALS) IMPLANT
POUCH RETRIEVAL ECOSAC 10MM (ENDOMECHANICALS) ×1
POUCH SPECIMEN RETRIEVAL 10MM (ENDOMECHANICALS) ×2 IMPLANT
SCISSORS LAP 5X35 DISP (ENDOMECHANICALS) ×2 IMPLANT
SET CHOLANGIOGRAPH 5 50 .035 (SET/KITS/TRAYS/PACK) ×2 IMPLANT
SET IRRIG TUBING LAPAROSCOPIC (IRRIGATION / IRRIGATOR) ×2 IMPLANT
SLEEVE ENDOPATH XCEL 5M (ENDOMECHANICALS) ×2 IMPLANT
SPECIMEN JAR SMALL (MISCELLANEOUS) ×2 IMPLANT
SPONGE GAUZE 2X2 STER 10/PKG (GAUZE/BANDAGES/DRESSINGS) ×1
STRIP CLOSURE SKIN 1/2X4 (GAUZE/BANDAGES/DRESSINGS) ×2 IMPLANT
SUT ETHILON 2 0 FS 18 (SUTURE) ×1 IMPLANT
SUT MNCRL AB 4-0 PS2 18 (SUTURE) ×2 IMPLANT
TOWEL OR 17X24 6PK STRL BLUE (TOWEL DISPOSABLE) ×2 IMPLANT
TOWEL OR 17X26 10 PK STRL BLUE (TOWEL DISPOSABLE) ×2 IMPLANT
TRAY LAPAROSCOPIC MC (CUSTOM PROCEDURE TRAY) ×2 IMPLANT
TROCAR XCEL BLUNT TIP 100MML (ENDOMECHANICALS) ×2 IMPLANT
TROCAR XCEL NON-BLD 11X100MML (ENDOMECHANICALS) ×2 IMPLANT
TROCAR XCEL NON-BLD 5MMX100MML (ENDOMECHANICALS) ×2 IMPLANT
TUBING INSUFFLATION (TUBING) ×2 IMPLANT

## 2016-05-10 NOTE — Discharge Instructions (Signed)

## 2016-05-10 NOTE — Anesthesia Postprocedure Evaluation (Addendum)
Anesthesia Post Note  Patient: Michele [name]  Procedure(s) Performed: Procedure(s) (LRB): LAPAROSCOPIC CHOLECYSTECTOMY WITH INTRAOPERATIVE CHOLANGIOGRAM (N/A)  Patient location during evaluation: PACU Anesthesia Type: General Level of consciousness: awake and sedated Pain management: pain level controlled Vital Signs Assessment: post-procedure vital signs reviewed and stable Respiratory status: spontaneous breathing Cardiovascular status: stable Postop Assessment: no signs of nausea or vomiting Anesthetic complications: no        Last Vitals:  Vitals:   05/10/16 1215 05/10/16 1230  BP: 129/83 125/83  Pulse: 83 84  Resp: 20 19  Temp:      Last Pain:  Vitals:   05/10/16 1200  TempSrc:   PainSc: Asleep   Pain Goal: Patients Stated Pain Goal: 2 (05/09/16 2157)               Shalana Jardin JR,JOHN Mateo Flow

## 2016-05-10 NOTE — Op Note (Signed)
Laparoscopic Cholecystectomy with IOC Procedure Note  Indications: This patient presents with a one week history of acute cholecystitis, diagnosed on ultrasound on 05/03/16.  Her pain worsened so she returned to the ED.  She presents now for cholecystectomy.  Pre-operative Diagnosis: Calculus of gallbladder with acute cholecystitis, without mention of obstruction  Post-operative Diagnosis: Same  Surgeon: Zeke Aker K.   Assistants: none  Anesthesia: General endotracheal anesthesia  ASA Class: 2  Procedure Details  The patient was seen again in the Holding Room. The risks, benefits, complications, treatment options, and expected outcomes were discussed with the patient. The possibilities of reaction to medication, pulmonary aspiration, perforation of viscus, bleeding, recurrent infection, finding a normal gallbladder, the need for additional procedures, failure to diagnose a condition, the possible need to convert to an open procedure, and creating a complication requiring transfusion or operation were discussed with the patient. The likelihood of improving the patient's symptoms with return to their baseline status is good.  The patient and/or family concurred with the proposed plan, giving informed consent. The site of surgery properly noted. The patient was taken to Operating Room, identified as Michele [name] and the procedure verified as Laparoscopic Cholecystectomy with Intraoperative Cholangiogram. A Time Out was held and the above information confirmed.  Prior to the induction of general anesthesia, antibiotic prophylaxis was administered. General endotracheal anesthesia was then administered and tolerated well. After the induction, the abdomen was prepped with Chloraprep and draped in the sterile fashion. The patient was positioned in the supine position.  Local anesthetic agent was injected into the skin near the umbilicus and an incision made. We dissected down to the abdominal  fascia with blunt dissection.  The fascia was incised vertically and we entered the peritoneal cavity bluntly.  A pursestring suture of 0-Vicryl was placed around the fascial opening.  The Hasson cannula was inserted and secured with the stay suture.  Pneumoperitoneum was then created with CO2 and tolerated well without any adverse changes in the patient's vital signs. An 11-mm port was placed in the subxiphoid position.  Two 5-mm ports were placed in the right upper quadrant. All skin incisions were infiltrated with a local anesthetic agent before making the incision and placing the trocars.   We positioned the patient in reverse Trendelenburg, tilted slightly to the patient's left.  The omentum is densely adherent to the liver and gallbladder.  We peeled the omentum away and identified a very distended, thickened, erythematous gallbladder.  We decompressed the gallbladder with the suction aspirator.  The fundus was grasped and retracted cephalad. Adhesions were lysed bluntly and with the electrocautery where indicated, taking care not to injure any adjacent organs or viscus. There was a lot of oozing due to the inflammation.  The infundibulum was grasped and retracted laterally, exposing the peritoneum overlying the triangle of Calot. This was then divided and exposed in a blunt fashion. A critical view of the cystic duct and cystic artery was obtained.  The cystic duct was clearly identified and bluntly dissected circumferentially. The cystic duct was ligated with a clip distally.   An incision was made in the cystic duct and the [facility] cholangiogram catheter introduced. The catheter was secured using a clip. A cholangiogram was then obtained which showed good visualization of the distal and proximal biliary tree with no sign of filling defects or obstruction.  Contrast flowed easily into the duodenum. The catheter was then removed.   The cystic duct was then ligated with clips and divided. The cystic  artery  was identified, dissected free, ligated with clips and divided as well.   The gallbladder was dissected from the liver bed in retrograde fashion with the electrocautery. This was extremely difficult due to the intrahepatic location of the gallbladder, the distention, and inflammation.  Some bile was spilled, but no stones were noted.  The gallbladder was removed and placed in an Eco sac. The liver bed was irrigated and inspected. Hemostasis was achieved with the electrocautery and Surgicel SNOW. Copious irrigation was utilized and was repeatedly aspirated until clear.  The gallbladder and Eco sac were then removed through the umbilical port site.  The pursestring suture was used to close the umbilical fascia.    We again inspected the right upper quadrant for hemostasis.  Pneumoperitoneum was released as we removed the trocars.  4-0 Monocryl was used to close the skin.   Benzoin, steri-strips, and clean dressings were applied. The patient was then extubated and brought to the recovery room in stable condition. Instrument, sponge, and needle counts were correct at closure and at the conclusion of the case.   Findings: Severe Acute Cholecystitis with Cholelithiasis  Estimated Blood Loss: 150 ml         Drains: JP drain in gallbladder fossa         Specimens: Gallbladder           Complications: None; patient tolerated the procedure well.         Disposition: PACU - hemodynamically stable.         Condition: stable  Imogene Burn. Georgette Dover, MD, [facility] Surgery  General/ Trauma Surgery  05/10/2016 11:18 AM

## 2016-05-10 NOTE — Transfer of Care (Signed)
Immediate Anesthesia Transfer of Care Note  Patient: Michele [name]  Procedure(s) Performed: Procedure(s): LAPAROSCOPIC CHOLECYSTECTOMY WITH INTRAOPERATIVE CHOLANGIOGRAM (N/A)  Patient Location: PACU  Anesthesia Type:General  Level of Consciousness: awake, alert , oriented and patient cooperative  Airway & Oxygen Therapy: Patient Spontanous Breathing and Patient connected to nasal cannula oxygen  Post-op Assessment: Report given to RN and Post -op Vital signs reviewed and stable  Post vital signs: Reviewed and stable  Last Vitals:  Vitals:   05/10/16 0618 05/10/16 0832  BP: 117/73 135/82  Pulse: 85 98  Resp: 18 18  Temp: 37.3 C 37.2 C    Last Pain:  Vitals:   05/10/16 0832  TempSrc: Oral  PainSc:       Patients Stated Pain Goal: 2 ([id number])  Complications: No apparent anesthesia complications

## 2016-05-10 NOTE — Progress Notes (Signed)
  Subjective: Still with some RUQ discomfort, although improved from admission.  Objective: Vital signs in last 24 hours: Temp:  [98.8 F (37.1 C)-99.7 F (37.6 C)] 99.1 F (37.3 C) (01/12 0618) Pulse Rate:  [85-103] 85 (01/12 0618) Resp:  [16-18] 18 (01/12 0618) BP: (117-135)/(73-86) 117/73 (01/12 0618) SpO2:  [94 %-100 %] 94 % (01/12 0618) Weight:  [99.3 kg (218 lb 14.7 oz)] 99.3 kg (218 lb 14.7 oz) (01/11 2206) Last BM Date: 05/09/16  Intake/Output from previous day: 01/11 0701 - 01/12 0700 In: 1296 [P.O.:240; I.V.:956; IV Piggyback:100] Out: 125 [Urine:125] Intake/Output this shift: No intake/output data recorded.  General appearance: alert, cooperative and no distress GI: soft, + BS; mild RUQ tenderness  Lab Results:   Recent Labs  05/09/16 1448 05/10/16 0413  WBC 17.1* 14.3*  HGB 12.2 11.2*  HCT 37.7 34.4*  PLT 396 346   BMET  Recent Labs  05/09/16 1448  NA 136  K 3.7  CL 102  CO2 23  GLUCOSE 104*  BUN 6  CREATININE 0.75  CALCIUM 9.7   PT/INR No results for input(s): LABPROT, INR in the last 72 hours. ABG No results for input(s): PHART, HCO3 in the last 72 hours.  Invalid input(s): PCO2, PO2  Studies/Results: No results found.  Anti-infectives: Anti-infectives    Start     Dose/Rate Route Frequency Ordered Stop   05/09/16 2200  cefTRIAXone (ROCEPHIN) 2 g in dextrose 5 % 50 mL IVPB     2 g 100 mL/hr over 30 Minutes Intravenous Every 24 hours 05/09/16 2150        Assessment/Plan: Acute calculous cholecystitis  Laparoscopic cholecystectomy with intraoperative cholangiogram today.  The surgical procedure has been discussed with the patient.  Potential risks, benefits, alternative treatments, and expected outcomes have been explained.  All of the patient's questions at this time have been answered.  The likelihood of reaching the patient's treatment goal is good.  The patient understand the proposed surgical procedure and wishes to  proceed.   LOS: 0 days    Damek Ende K. 05/10/2016

## 2016-05-10 NOTE — Anesthesia Preprocedure Evaluation (Signed)
Anesthesia Evaluation  Patient identified by MRN, date of birth, ID band Patient awake    Reviewed: Allergy & Precautions, H&P , NPO status , Patient's Chart, lab work & pertinent test results  Airway Mallampati: II  TM Distance: >3 FB Neck ROM: Full    Dental no notable dental hx. (+) Teeth Intact, Dental Advisory Given   Pulmonary neg pulmonary ROS,    Pulmonary exam normal breath sounds clear to auscultation       Cardiovascular hypertension, Pt. on medications  Rhythm:Regular Rate:Normal     Neuro/Psych negative neurological ROS  negative psych ROS   GI/Hepatic negative GI ROS, Neg liver ROS,   Endo/Other  negative endocrine ROS  Renal/GU negative Renal ROS  negative genitourinary   Musculoskeletal   Abdominal   Peds  Hematology negative hematology ROS (+)   Anesthesia Other Findings   Reproductive/Obstetrics negative OB ROS                             Anesthesia Physical Anesthesia Plan  ASA: II  Anesthesia Plan: General   Post-op Pain Management:    Induction: Intravenous  Airway Management Planned: Oral ETT  Additional Equipment:   Intra-op Plan:   Post-operative Plan: Extubation in OR  Informed Consent: I have reviewed the patients History and Physical, chart, labs and discussed the procedure including the risks, benefits and alternatives for the proposed anesthesia with the patient or authorized representative who has indicated his/her understanding and acceptance.   Dental advisory given  Plan Discussed with: CRNA  Anesthesia Plan Comments:         Anesthesia Quick Evaluation

## 2016-05-11 ENCOUNTER — Encounter (HOSPITAL_COMMUNITY): Payer: Self-pay | Admitting: Surgery

## 2016-05-11 LAB — CBC
HEMATOCRIT: 36.4 % (ref 36.0–46.0)
HEMOGLOBIN: 11.7 g/dL — AB (ref 12.0–15.0)
MCH: 27.9 pg (ref 26.0–34.0)
MCHC: 32.1 g/dL (ref 30.0–36.0)
MCV: 86.9 fL (ref 78.0–100.0)
Platelets: 393 10*3/uL (ref 150–400)
RBC: 4.19 MIL/uL (ref 3.87–5.11)
RDW: 13.5 % (ref 11.5–15.5)
WBC: 20.5 10*3/uL — AB (ref 4.0–10.5)

## 2016-05-11 LAB — COMPREHENSIVE METABOLIC PANEL
ALBUMIN: 2.9 g/dL — AB (ref 3.5–5.0)
ALK PHOS: 77 U/L (ref 38–126)
ALT: 38 U/L (ref 14–54)
AST: 56 U/L — AB (ref 15–41)
Anion gap: 9 (ref 5–15)
BILIRUBIN TOTAL: 0.8 mg/dL (ref 0.3–1.2)
BUN: 5 mg/dL — AB (ref 6–20)
CALCIUM: 9.1 mg/dL (ref 8.9–10.3)
CO2: 26 mmol/L (ref 22–32)
CREATININE: 0.81 mg/dL (ref 0.44–1.00)
Chloride: 105 mmol/L (ref 101–111)
GFR calc Af Amer: >60 mL/min (ref >60)
GFR calc non Af Amer: >60 mL/min (ref >60)
GLUCOSE: 122 mg/dL — AB (ref 65–99)
Potassium: 4.3 mmol/L (ref 3.5–5.1)
Sodium: 140 mmol/L (ref 135–145)
TOTAL PROTEIN: 6.9 g/dL (ref 6.5–8.1)

## 2016-05-11 MED ORDER — BISACODYL 5 MG PO TBEC
5.0000 mg | DELAYED_RELEASE_TABLET | Freq: Two times a day (BID) | ORAL | Status: DC
  Administered 2016-05-11 – 2016-05-14 (×7): 5 mg via ORAL
  Filled 2016-05-11 (×7): qty 1

## 2016-05-11 MED ORDER — POLYETHYLENE GLYCOL 3350 17 G PO PACK
17.0000 g | PACK | Freq: Every day | ORAL | Status: DC
  Administered 2016-05-11 – 2016-05-14 (×4): 17 g via ORAL
  Filled 2016-05-11 (×4): qty 1

## 2016-05-11 MED ORDER — METHOCARBAMOL 500 MG PO TABS
500.0000 mg | ORAL_TABLET | Freq: Three times a day (TID) | ORAL | Status: DC | PRN
  Administered 2016-05-12 – 2016-05-14 (×2): 500 mg via ORAL
  Filled 2016-05-11 (×2): qty 1

## 2016-05-11 NOTE — Progress Notes (Signed)
Patient ID: Michele [name], female   DOB: [birth year], 41 y.o.   MRN: [id number]  [facility] Surgery Progress Note  1 Day Post-Op  Subjective: Abdomen sore but less painful than yesterday. She did require a small amount of IV pain medication over night. Tolerating small amounts of diet. Denies n/v. No flatus or BM. Has walked to/from the bathroom.   Employment: auditor  Objective: Vital signs in last 24 hours: Temp:  [98.5 F (36.9 C)-99.1 F (37.3 C)] 99.1 F (37.3 C) (01/13 0440) Pulse Rate:  [77-101] 101 (01/13 0440) Resp:  [9-20] 18 (01/13 0440) BP: (110-135)/(67-91) 124/79 (01/13 0440) SpO2:  [94 %-100 %] 97 % (01/13 0440) Last BM Date: 05/09/16  Intake/Output from previous day: 01/12 0701 - 01/13 0700 In: 3800 [P.O.:1500; I.V.:2250; IV Piggyback:50] Out: [id number] [Urine:3675; Drains:61; Blood:75] Intake/Output this shift: No intake/output data recorded.  PE: Gen:  Alert, NAD, pleasant Card:  RRR, no M/G/R heard Pulm:  CTAB, no W/R/R, effort normal Abd: Soft, ND, +BS, appropriately tender, incisions covered with clean/dry dressings, drain with minimal serosanguinous drainage  Lab Results:   Recent Labs  05/10/16 0413 05/11/16 0447  WBC 14.3* 20.5*  HGB 11.2* 11.7*  HCT 34.4* 36.4  PLT 346 393   BMET  Recent Labs  05/09/16 1448 05/11/16 0447  NA 136 140  K 3.7 4.3  CL 102 105  CO2 23 26  GLUCOSE 104* 122*  BUN 6 5*  CREATININE 0.75 0.81  CALCIUM 9.7 9.1   PT/INR No results for input(s): LABPROT, INR in the last 72 hours. CMP     Component Value Date/Time   NA 140 05/11/2016 0447   K 4.3 05/11/2016 0447   CL 105 05/11/2016 0447   CO2 26 05/11/2016 0447   GLUCOSE 122 (H) 05/11/2016 0447   BUN 5 (L) 05/11/2016 0447   CREATININE 0.81 05/11/2016 0447   CREATININE 0.76 05/06/2016 1522   CALCIUM 9.1 05/11/2016 0447   PROT 6.9 05/11/2016 0447   ALBUMIN 2.9 (L) 05/11/2016 0447   AST 56 (H) 05/11/2016 0447   ALT 38 05/11/2016 0447   ALKPHOS  77 05/11/2016 0447   BILITOT 0.8 05/11/2016 0447   GFRNONAA >60 05/11/2016 0447   GFRNONAA >89 05/06/2016 1522   GFRAA >60 05/11/2016 0447   GFRAA >89 05/06/2016 1522   Lipase     Component Value Date/Time   LIPASE 20 05/09/2016 1448       Studies/Results: Dg Cholangiogram Operative  Result Date: 05/10/2016 CLINICAL DATA:  Surgery EXAM: INTRAOPERATIVE CHOLANGIOGRAM TECHNIQUE: Cholangiographic images from the C-arm fluoroscopic device were submitted for interpretation post-operatively. Please see the procedural report for the amount of contrast and the fluoroscopy time utilized. COMPARISON:  None. FINDINGS: Contrast fills the biliary tree and duodenum without filling defects in the common bile ducts. IMPRESSION: Patent biliary tree. Electronically Signed   By: Marybelle Killings M.D.   On: 05/10/2016 11:22    Anti-infectives: Anti-infectives    Start     Dose/Rate Route Frequency Ordered Stop   05/09/16 2200  cefTRIAXone (ROCEPHIN) 2 g in dextrose 5 % 50 mL IVPB     2 g 100 mL/hr over 30 Minutes Intravenous Every 24 hours 05/09/16 2150         Assessment/Plan Acute calculous cholecystitis S/p lap chole with IOC 1/12 Dr. Georgette Dover - POD 1 - IOC negative for obstruction - LFTs WNL tday - WBC up to 20.5, low grade temp 99.1 - drain 61cc/24hr serosanguinous  HTN - lisinopril, HCTZ  ID - rocephin 1/11>> FEN - regular diet VTE - lovenox  Plan - continue working on pain control, using PO's rather than IV as much as possible. Add robaxin PRN. Encourage mobility/IS. Add stool softener BID and miralax PRN. Decrease IVF to 6mL/hr. Continue drain and IV antibiotics. CMP/CBC in AM   LOS: 0 days    Jerrye Beavers , [facility] Surgery 05/11/2016, 8:28 AM Pager: [phone] Consults: [phone] Mon-Fri 7:00 am-4:30 pm Sat-Sun 7:00 am-11:30 am

## 2016-05-12 DIAGNOSIS — K810 Acute cholecystitis: Secondary | ICD-10-CM | POA: Diagnosis present

## 2016-05-12 DIAGNOSIS — Z8042 Family history of malignant neoplasm of prostate: Secondary | ICD-10-CM | POA: Diagnosis not present

## 2016-05-12 DIAGNOSIS — Z8249 Family history of ischemic heart disease and other diseases of the circulatory system: Secondary | ICD-10-CM | POA: Diagnosis not present

## 2016-05-12 DIAGNOSIS — K8000 Calculus of gallbladder with acute cholecystitis without obstruction: Secondary | ICD-10-CM | POA: Diagnosis present

## 2016-05-12 DIAGNOSIS — Z6836 Body mass index (BMI) 36.0-36.9, adult: Secondary | ICD-10-CM | POA: Diagnosis not present

## 2016-05-12 DIAGNOSIS — K567 Ileus, unspecified: Secondary | ICD-10-CM | POA: Diagnosis present

## 2016-05-12 DIAGNOSIS — E669 Obesity, unspecified: Secondary | ICD-10-CM | POA: Diagnosis present

## 2016-05-12 DIAGNOSIS — I10 Essential (primary) hypertension: Secondary | ICD-10-CM | POA: Diagnosis present

## 2016-05-12 LAB — COMPREHENSIVE METABOLIC PANEL
ALK PHOS: 76 U/L (ref 38–126)
ALT: 36 U/L (ref 14–54)
AST: 35 U/L (ref 15–41)
Albumin: 2.5 g/dL — ABNORMAL LOW (ref 3.5–5.0)
Anion gap: 8 (ref 5–15)
BUN: <5 mg/dL — ABNORMAL LOW (ref 6–20)
CO2: 26 mmol/L (ref 22–32)
CREATININE: 0.72 mg/dL (ref 0.44–1.00)
Calcium: 8.6 mg/dL — ABNORMAL LOW (ref 8.9–10.3)
Chloride: 102 mmol/L (ref 101–111)
Glucose, Bld: 97 mg/dL (ref 65–99)
Potassium: 3.6 mmol/L (ref 3.5–5.1)
Sodium: 136 mmol/L (ref 135–145)
Total Bilirubin: 1 mg/dL (ref 0.3–1.2)
Total Protein: 6.5 g/dL (ref 6.5–8.1)

## 2016-05-12 LAB — CBC
HCT: 34 % — ABNORMAL LOW (ref 36.0–46.0)
HEMOGLOBIN: 10.6 g/dL — AB (ref 12.0–15.0)
MCH: 27.2 pg (ref 26.0–34.0)
MCHC: 31.2 g/dL (ref 30.0–36.0)
MCV: 87.2 fL (ref 78.0–100.0)
Platelets: 361 10*3/uL (ref 150–400)
RBC: 3.9 MIL/uL (ref 3.87–5.11)
RDW: 13.6 % (ref 11.5–15.5)
WBC: 15.5 10*3/uL — ABNORMAL HIGH (ref 4.0–10.5)

## 2016-05-12 NOTE — Progress Notes (Signed)
Patient ID: Michele [name], female   DOB: 02-Jul-1975, 41 y.o.   MRN: [id number]  [facility] Surgery Progress Note  2 Days Post-Op  Subjective: Patient more distended today. Not passing flatus. Walked a few laps yesterday. Denies n/v. Not longer requiring IV pain medication.  Objective: Vital signs in last 24 hours: Temp:  [97.9 F (36.6 C)-99 F (37.2 C)] 98.7 F (37.1 C) (01/14 0520) Pulse Rate:  [103-108] 103 (01/14 0520) Resp:  [18-19] 18 (01/14 0520) BP: (110-123)/(63-71) 112/70 (01/14 0520) SpO2:  [93 %-100 %] 99 % (01/14 0520) Last BM Date: 05/09/16  Intake/Output from previous day: 01/13 0701 - 01/14 0700 In: 1394 [P.O.:120; I.V.:1274] Out: 1300 [Urine:1300] Intake/Output this shift: No intake/output data recorded.  PE: Gen:  Alert, NAD, pleasant Card:  RRR, no M/G/R heard Pulm:  CTAB, no W/R/R, effort normal Abd: Soft, distended, hypoactive BS, appropriately tender, incisions covered with clean/dry dressings, drain with minimal serosanguinous drainage  Lab Results:   Recent Labs  05/11/16 0447 05/12/16 0328  WBC 20.5* 15.5*  HGB 11.7* 10.6*  HCT 36.4 34.0*  PLT 393 361   BMET  Recent Labs  05/11/16 0447 05/12/16 0328  NA 140 136  K 4.3 3.6  CL 105 102  CO2 26 26  GLUCOSE 122* 97  BUN 5* <5*  CREATININE 0.81 0.72  CALCIUM 9.1 8.6*   PT/INR No results for input(s): LABPROT, INR in the last 72 hours. CMP     Component Value Date/Time   NA 136 05/12/2016 0328   K 3.6 05/12/2016 0328   CL 102 05/12/2016 0328   CO2 26 05/12/2016 0328   GLUCOSE 97 05/12/2016 0328   BUN <5 (L) 05/12/2016 0328   CREATININE 0.72 05/12/2016 0328   CREATININE 0.76 05/06/2016 1522   CALCIUM 8.6 (L) 05/12/2016 0328   PROT 6.5 05/12/2016 0328   ALBUMIN 2.5 (L) 05/12/2016 0328   AST 35 05/12/2016 0328   ALT 36 05/12/2016 0328   ALKPHOS 76 05/12/2016 0328   BILITOT 1.0 05/12/2016 0328   GFRNONAA >60 05/12/2016 0328   GFRNONAA >89 05/06/2016 1522   GFRAA >60 05/12/2016 0328   GFRAA >89 05/06/2016 1522   Lipase     Component Value Date/Time   LIPASE 20 05/09/2016 1448       Studies/Results: Dg Cholangiogram Operative  Result Date: 05/10/2016 CLINICAL DATA:  Surgery EXAM: INTRAOPERATIVE CHOLANGIOGRAM TECHNIQUE: Cholangiographic images from the C-arm fluoroscopic device were submitted for interpretation post-operatively. Please see the procedural report for the amount of contrast and the fluoroscopy time utilized. COMPARISON:  None. FINDINGS: Contrast fills the biliary tree and duodenum without filling defects in the common bile ducts. IMPRESSION: Patent biliary tree. Electronically Signed   By: Marybelle Killings M.D.   On: 05/10/2016 11:22    Anti-infectives: Anti-infectives    Start     Dose/Rate Route Frequency Ordered Stop   05/09/16 2200  cefTRIAXone (ROCEPHIN) 2 g in dextrose 5 % 50 mL IVPB     2 g 100 mL/hr over 30 Minutes Intravenous Every 24 hours 05/09/16 2150         Assessment/Plan Acute calculous cholecystitis S/p lap chole with IOC 1/12 Dr. Georgette Dover - POD 2 - IOC negative for obstruction - LFTs WNL tday - WBC trending down, 15.5 from 20.5 - drain output recorded as 0cc/24hr, but there is a small amount (?10-15cc) serosanguinous fluid in drain  HTN - lisinopril, HCTZ  ID - rocephin 1/11>> FEN - regular diet VTE - lovenox  Plan - Patient has a postoperative ileus. Encourage ambulation today. Continue stool softener BID and miralax PRN. Continue drain and IV antibiotics.     LOS: 0 days    Jerrye Beavers , [facility] Surgery 05/12/2016, 8:24 AM Pager: [phone] Consults: [phone] Mon-Fri 7:00 am-4:30 pm Sat-Sun 7:00 am-11:30 am  Agree with above. Up walking in the hall, listening to sermon at church.  Alphonsa Overall, MD, [facility] Surgery Pager: [phone] Office phone:  [phone]

## 2016-05-13 LAB — CBC
HCT: 33.5 % — ABNORMAL LOW (ref 36.0–46.0)
Hemoglobin: 10.6 g/dL — ABNORMAL LOW (ref 12.0–15.0)
MCH: 27.7 pg (ref 26.0–34.0)
MCHC: 31.6 g/dL (ref 30.0–36.0)
MCV: 87.7 fL (ref 78.0–100.0)
PLATELETS: 369 10*3/uL (ref 150–400)
RBC: 3.82 MIL/uL — AB (ref 3.87–5.11)
RDW: 13.8 % (ref 11.5–15.5)
WBC: 14.9 10*3/uL — ABNORMAL HIGH (ref 4.0–10.5)

## 2016-05-13 LAB — COMPREHENSIVE METABOLIC PANEL
ALBUMIN: 2.7 g/dL — AB (ref 3.5–5.0)
ALT: 30 U/L (ref 14–54)
AST: 27 U/L (ref 15–41)
Alkaline Phosphatase: 89 U/L (ref 38–126)
Anion gap: 11 (ref 5–15)
BUN: 5 mg/dL — AB (ref 6–20)
CHLORIDE: 102 mmol/L (ref 101–111)
CO2: 25 mmol/L (ref 22–32)
CREATININE: 0.7 mg/dL (ref 0.44–1.00)
Calcium: 8.9 mg/dL (ref 8.9–10.3)
GFR calc Af Amer: >60 mL/min (ref >60)
GFR calc non Af Amer: >60 mL/min (ref >60)
GLUCOSE: 84 mg/dL (ref 65–99)
POTASSIUM: 4.1 mmol/L (ref 3.5–5.1)
SODIUM: 138 mmol/L (ref 135–145)
Total Bilirubin: 0.5 mg/dL (ref 0.3–1.2)
Total Protein: 6.3 g/dL — ABNORMAL LOW (ref 6.5–8.1)

## 2016-05-13 NOTE — Progress Notes (Signed)
[facility] Surgery Progress Note  3 Days Post-Op  Subjective: Pt states her abdominal pain is better. Still needing the percocet. No nausea, vomiting, fever, chills or cough overnight. She is passing a little flatus. Abdominal feels less bloated. Was up walking yesterday. Complains of decreased appetite. No other new complaints.   Objective: Vital signs in last 24 hours: Temp:  [98 F (36.7 C)-99.2 F (37.3 C)] 98.6 F (37 C) (01/15 0442) Pulse Rate:  [101-103] 101 (01/15 0442) Resp:  [18] 18 (01/15 0442) BP: (121-127)/(75-77) 121/75 (01/15 0442) SpO2:  [97 %-100 %] 97 % (01/15 0442) Last BM Date: 05/09/16  Intake/Output from previous day: 01/14 0701 - 01/15 0700 In: 2319 [P.O.:1169; I.V.:1150] Out: [id number] [Urine:3600; Drains:45] Intake/Output this shift: No intake/output data recorded.  PE: Gen:  Alert, NAD, pleasant, cooperative Card:  RRR, no M/G/R heard Pulm:  effort and rate normal Abd: Soft, mildly distended, hypoactive BS, mild TTP to RUQ and RLQ. incisions clean, dry without surrounding erythema or signs of infection, drain with minimal sanguinous drainage Skin: no rashes noted, warm and dry  Lab Results:   Recent Labs  05/12/16 0328 05/13/16 0528  WBC 15.5* 14.9*  HGB 10.6* 10.6*  HCT 34.0* 33.5*  PLT 361 369   BMET  Recent Labs  05/12/16 0328 05/13/16 0528  NA 136 138  K 3.6 4.1  CL 102 102  CO2 26 25  GLUCOSE 97 84  BUN <5* 5*  CREATININE 0.72 0.70  CALCIUM 8.6* 8.9   PT/INR No results for input(s): LABPROT, INR in the last 72 hours. CMP     Component Value Date/Time   NA 138 05/13/2016 0528   K 4.1 05/13/2016 0528   CL 102 05/13/2016 0528   CO2 25 05/13/2016 0528   GLUCOSE 84 05/13/2016 0528   BUN 5 (L) 05/13/2016 0528   CREATININE 0.70 05/13/2016 0528   CREATININE 0.76 05/06/2016 1522   CALCIUM 8.9 05/13/2016 0528   PROT 6.3 (L) 05/13/2016 0528   ALBUMIN 2.7 (L) 05/13/2016 0528   AST 27 05/13/2016 0528   ALT 30 05/13/2016  0528   ALKPHOS 89 05/13/2016 0528   BILITOT 0.5 05/13/2016 0528   GFRNONAA >60 05/13/2016 0528   GFRNONAA >89 05/06/2016 1522   GFRAA >60 05/13/2016 0528   GFRAA >89 05/06/2016 1522   Lipase     Component Value Date/Time   LIPASE 20 05/09/2016 1448       Studies/Results: No results found.  Anti-infectives: Anti-infectives    Start     Dose/Rate Route Frequency Ordered Stop   05/09/16 2200  cefTRIAXone (ROCEPHIN) 2 g in dextrose 5 % 50 mL IVPB     2 g 100 mL/hr over 30 Minutes Intravenous Every 24 hours 05/09/16 2150         Assessment/Plan  Acute calculous cholecystitis S/p lap chole with IOC1/12 Dr. Georgette Dover - POD 3 - IOC negative for obstruction - LFTs WNL tday - WBC trending down, 14.9 from 15.5 - drain output 45cc/24hr  HTN - lisinopril, HCTZ  ID - rocephin 1/11>> FEN - regular diet VTE - lovenox  Plan - Patient has a postoperative ileus. Appears to be improving. Encourage ambulation. Continue stool softener BID and miralax PRN. Continue drain and IV antibiotics. If WBC WNL tomorrow and passing flatus will likely discharge to home tomorrow.     LOS: 1 day    Kalman Drape , [facility] Surgery 05/13/2016, 9:32 AM Pager: [phone] Consults: [phone] Mon-Fri 7:00 am-4:30 pm  Sat-Sun 7:00 am-11:30 am

## 2016-05-14 LAB — COMPREHENSIVE METABOLIC PANEL
ALBUMIN: 2.6 g/dL — AB (ref 3.5–5.0)
ALK PHOS: 89 U/L (ref 38–126)
ALT: 27 U/L (ref 14–54)
AST: 21 U/L (ref 15–41)
Anion gap: 9 (ref 5–15)
BUN: 7 mg/dL (ref 6–20)
CHLORIDE: 102 mmol/L (ref 101–111)
CO2: 27 mmol/L (ref 22–32)
CREATININE: 0.69 mg/dL (ref 0.44–1.00)
Calcium: 8.9 mg/dL (ref 8.9–10.3)
GFR calc non Af Amer: >60 mL/min (ref >60)
GLUCOSE: 91 mg/dL (ref 65–99)
Potassium: 3.6 mmol/L (ref 3.5–5.1)
SODIUM: 138 mmol/L (ref 135–145)
Total Bilirubin: 0.6 mg/dL (ref 0.3–1.2)
Total Protein: 6 g/dL — ABNORMAL LOW (ref 6.5–8.1)

## 2016-05-14 LAB — CBC
HCT: 31.7 % — ABNORMAL LOW (ref 36.0–46.0)
HEMOGLOBIN: 10 g/dL — AB (ref 12.0–15.0)
MCH: 27.4 pg (ref 26.0–34.0)
MCHC: 31.5 g/dL (ref 30.0–36.0)
MCV: 86.8 fL (ref 78.0–100.0)
PLATELETS: 364 10*3/uL (ref 150–400)
RBC: 3.65 MIL/uL — AB (ref 3.87–5.11)
RDW: 13.9 % (ref 11.5–15.5)
WBC: 11.6 10*3/uL — AB (ref 4.0–10.5)

## 2016-05-14 MED ORDER — OXYCODONE-ACETAMINOPHEN 5-325 MG PO TABS: 1.0000 | ORAL_TABLET | ORAL | 0 refills | Status: DC | PRN

## 2016-05-14 NOTE — Care Management Note (Signed)
Case Management Note  Patient Details  Name: JAKARI KRUPPA MRN: [id number] Date of Birth: [birth year]  Subjective/Objective:                    Action/Plan: Pt discharging home with self care. No further needs per CM.   Expected Discharge Date:  05/14/16               Expected Discharge Plan:  Home/Self Care  In-House Referral:     Discharge planning Services     Post Acute Care Choice:    Choice offered to:     DME Arranged:    DME Agency:     HH Arranged:    HH Agency:     Status of Service:  Completed, signed off  If discussed at [employer] of Stay Meetings, dates discussed:    Additional Comments:  Pollie Friar, RN 05/14/2016, 11:10 AM

## 2016-05-14 NOTE — Discharge Planning (Signed)
Patient discharged home in stable condition. Verbalizes understanding of all discharge instructions, including home medications and follow up appointments. 

## 2016-05-14 NOTE — Progress Notes (Signed)
[facility] Progress Note  4 Days Post-Op  Subjective: No new complaints. She is having flatus. No nausea, vomiting, fever or chills overnight.   Objective: Vital signs in last 24 hours: Temp:  [98.6 F (37 C)-98.8 F (37.1 C)] 98.8 F (37.1 C) (01/16 0431) Pulse Rate:  [84-109] 84 (01/16 0431) Resp:  [17-18] 17 (01/16 0431) BP: (115-122)/(78-83) 122/78 (01/16 0431) SpO2:  [96 %-97 %] 96 % (01/16 0431) Last BM Date: 05/09/16  Intake/Output from previous day: 01/15 0701 - 01/16 0700 In: 840 [P.O.:840] Out: 1235 [Urine:1200; Drains:35] Intake/Output this shift: No intake/output data recorded.  PE: Gen:  Alert, NAD, pleasant, cooperative Card:  RRR, no M/G/R heard Pulm:  effort and rate normal, CTA bilaterally, no wheezes, rales or rhonchi noted Abd: Soft, mildly distended, + BS, mild TTP to RUQ and RLQ. incisions clean, dry without surrounding erythema or signs of infection, drain with minimal serosanguinous drainage Skin: no rashes noted, warm and dry  Lab Results:   Recent Labs  05/13/16 0528 05/14/16 0444  WBC 14.9* 11.6*  HGB 10.6* 10.0*  HCT 33.5* 31.7*  PLT 369 364   BMET  Recent Labs  05/13/16 0528 05/14/16 0444  NA 138 138  K 4.1 3.6  CL 102 102  CO2 25 27  GLUCOSE 84 91  BUN 5* 7  CREATININE 0.70 0.69  CALCIUM 8.9 8.9   PT/INR No results for input(s): LABPROT, INR in the last 72 hours. CMP     Component Value Date/Time   NA 138 05/14/2016 0444   K 3.6 05/14/2016 0444   CL 102 05/14/2016 0444   CO2 27 05/14/2016 0444   GLUCOSE 91 05/14/2016 0444   BUN 7 05/14/2016 0444   CREATININE 0.69 05/14/2016 0444   CREATININE 0.76 05/06/2016 1522   CALCIUM 8.9 05/14/2016 0444   PROT 6.0 (L) 05/14/2016 0444   ALBUMIN 2.6 (L) 05/14/2016 0444   AST 21 05/14/2016 0444   ALT 27 05/14/2016 0444   ALKPHOS 89 05/14/2016 0444   BILITOT 0.6 05/14/2016 0444   GFRNONAA >60 05/14/2016 0444   GFRNONAA >89 05/06/2016 1522   GFRAA >60  05/14/2016 0444   GFRAA >89 05/06/2016 1522   Lipase     Component Value Date/Time   LIPASE 20 05/09/2016 1448       Studies/Results: No results found.  Anti-infectives: Anti-infectives    Start     Dose/Rate Route Frequency Ordered Stop   05/09/16 2200  cefTRIAXone (ROCEPHIN) 2 g in dextrose 5 % 50 mL IVPB  Status:  Discontinued     2 g 100 mL/hr over 30 Minutes Intravenous Every 24 hours 05/09/16 2150 05/13/16 1040       Assessment/Plan  Acute calculous cholecystitis S/p lap chole with IOC1/12 Dr. Georgette Dover - POD 4 - IOC negative for obstruction - LFTs WNL tday - WBC trending down, 11.6 from 14.9 - drain output 35cc/24hr  HTN - lisinopril, HCTZ  ID - rocephin 1/11>>1/15 FEN - regular diet VTE - lovenox  Plan - Postoperative ileus appears to be resolving. No BM but pt is having flatus and no nausea or vomiting. She is tolerating her diet. Will be discharged home today.     LOS: 2 days    Kalman Drape , [facility] 05/14/2016, 7:41 AM Pager: [phone] Consults: [phone] Mon-Fri 7:00 am-4:30 pm Sat-Sun 7:00 am-11:30 am

## 2016-05-14 NOTE — Discharge Summary (Signed)
[facility] Discharge Summary   Patient ID: Michele [name] MRN: [id number] DOB/AGE: July 11, 1975 41 y.o.  Admit date: 05/09/2016 Discharge date: 05/14/2016  Admitting Diagnosis: Acute cholecystitis with cholelithiasis  Discharge Diagnosis Patient Active Problem List   Diagnosis Date Noted  . Cholecystitis, acute with cholelithiasis 05/09/2016  . Gynecomastia, female 02/03/2013  . Back pain, acute 06/22/2012  . Obesity 05/25/2010  . Essential hypertension 05/25/2010    Consultants None  Procedures Dr. Georgette Dover (05/10/16) - Laparoscopic Cholecystectomy with [facility] Course:  Michele [name] is a 41 year old female who presented to [facility] with epigastric and RUQ abdominal pain for one week.  Workup showed cholelithiasis with acute cholecystitis.  Patient was admitted and underwent procedure listed above and drain was placed.  Tolerated procedure well and was transferred to the floor. Pt developed a postoperative ileus. Ileus resolved and diet was advanced as tolerated.  On POD#4, the patient was voiding well, tolerating diet, having flatus, ambulating well, pain well controlled, vital signs stable, incisions c/d/i and felt stable for discharge home. Drain was removed prior to discharge. Patient will follow up in our office in 2 weeks and knows to call with questions or concerns.  She will call to confirm appointment date/time.  Pt was discharged in good condition.    Allergies as of 05/14/2016   No Known Allergies     Medication List    STOP taking these medications   magnesium citrate Soln     TAKE these medications   calcium carbonate 500 MG chewable tablet Commonly known as:  TUMS - dosed in mg elemental calcium Chew 1 tablet by mouth daily as needed for indigestion or heartburn.   levonorgestrel 20 MCG/24HR IUD Commonly known as:  MIRENA 1 each by Intrauterine route once.   lisinopril-hydrochlorothiazide 10-12.5 MG tablet Commonly known as:   PRINZIDE,ZESTORETIC Take 1 tablet by mouth every morning.   oxyCODONE-acetaminophen 5-325 MG tablet Commonly known as:  PERCOCET/ROXICET Take 1 tablet by mouth every 4 (four) hours as needed for moderate pain.        Follow-up [facility], [place]. Schedule an appointment as soon as possible for a visit in 2 week(s).   Specialty:  General Surgery Why:  Call our office if you do not hear from [place] within 1 week Contact information: [address] [place] [phone]          Signed: [facility] 05/14/2016, 10:03 AM Pager: [phone] Consults: [phone] Mon-Fri 7:00 am-4:30 pm Sat-Sun 7:00 am-11:30 am

## 2016-07-03 DIAGNOSIS — K080 Exfoliation of teeth due to systemic causes: Secondary | ICD-10-CM | POA: Diagnosis not present

## 2016-08-06 DIAGNOSIS — Z30433 Encounter for removal and reinsertion of intrauterine contraceptive device: Secondary | ICD-10-CM | POA: Diagnosis not present

## 2016-08-06 DIAGNOSIS — Z113 Encounter for screening for infections with a predominantly sexual mode of transmission: Secondary | ICD-10-CM | POA: Diagnosis not present

## 2016-08-07 DIAGNOSIS — K080 Exfoliation of teeth due to systemic causes: Secondary | ICD-10-CM | POA: Diagnosis not present

## 2016-08-08 ENCOUNTER — Encounter: Payer: Self-pay | Admitting: Family Medicine

## 2016-08-21 ENCOUNTER — Encounter: Payer: Self-pay | Admitting: Family Medicine

## 2016-08-21 DIAGNOSIS — Z30431 Encounter for routine checking of intrauterine contraceptive device: Secondary | ICD-10-CM | POA: Diagnosis not present

## 2016-10-04 NOTE — Addendum Note (Signed)
Addendum  created 10/04/16 0917 by Rhondalyn Clingan, MD   Sign clinical note    

## 2016-11-11 DIAGNOSIS — Z6837 Body mass index (BMI) 37.0-37.9, adult: Secondary | ICD-10-CM | POA: Diagnosis not present

## 2016-11-11 DIAGNOSIS — Z3040 Encounter for surveillance of contraceptives, unspecified: Secondary | ICD-10-CM | POA: Diagnosis not present

## 2016-11-11 DIAGNOSIS — Z01419 Encounter for gynecological examination (general) (routine) without abnormal findings: Secondary | ICD-10-CM | POA: Diagnosis not present

## 2016-11-11 DIAGNOSIS — N852 Hypertrophy of uterus: Secondary | ICD-10-CM | POA: Diagnosis not present

## 2016-12-10 DIAGNOSIS — D259 Leiomyoma of uterus, unspecified: Secondary | ICD-10-CM | POA: Diagnosis not present

## 2017-01-06 DIAGNOSIS — K080 Exfoliation of teeth due to systemic causes: Secondary | ICD-10-CM | POA: Diagnosis not present

## 2017-04-02 ENCOUNTER — Other Ambulatory Visit: Payer: Self-pay | Admitting: Family Medicine

## 2017-04-02 DIAGNOSIS — I10 Essential (primary) hypertension: Secondary | ICD-10-CM

## 2017-06-30 DIAGNOSIS — N915 Oligomenorrhea, unspecified: Secondary | ICD-10-CM | POA: Diagnosis not present

## 2017-06-30 DIAGNOSIS — D259 Leiomyoma of uterus, unspecified: Secondary | ICD-10-CM | POA: Diagnosis not present

## 2017-07-04 ENCOUNTER — Other Ambulatory Visit: Payer: Self-pay | Admitting: Family Medicine

## 2017-07-04 DIAGNOSIS — I10 Essential (primary) hypertension: Secondary | ICD-10-CM

## 2017-07-07 DIAGNOSIS — K080 Exfoliation of teeth due to systemic causes: Secondary | ICD-10-CM | POA: Diagnosis not present

## 2017-10-07 ENCOUNTER — Telehealth: Payer: Self-pay | Admitting: Family Medicine

## 2017-10-07 NOTE — Telephone Encounter (Signed)
Copied from [facility] [id number]. Topic: Quick Communication - Rx Refill/Question >> Oct 07, 2017  2:14 PM Keene Breath wrote: Medication: lisinopril-hydrochlorothiazide (PRINZIDE,ZESTORETIC) 10-12.5 MG tablet  Patient called to schedule an annual appt. And to request refill for BP Medication.  Preferred Pharmacy (with phone number or street name): [id number] [address], [place]. [phone] (Phone) [phone] (Fax)

## 2017-10-08 NOTE — Telephone Encounter (Signed)
Call to pharmacy- patient has Rx on fill and they will fill.

## 2017-11-01 NOTE — Progress Notes (Signed)
HPI:  Using dictation device. Unfortunately this device frequently misinterprets words/phrases.  Here for CPE and transfer of care.  -Concerns and/or follow up today:   PMH HTN and obesity.  Reports is taking her blood pressure medication daily.  She needs a refill on this.  Reports she has not been exercising much as she sprained her ankle a few months ago, this is finally doing better and she plans to start back exercising.  Trying to eat healthier, though does travel a lot which makes this difficult.  She sees Dr. Cletis Media for her gynecology exams and reports she has her annual exam next month.  She does her Pap smears there.  She plans to do her mammogram there as well.  -Diet: variety of foods, balance and well rounded, larger portion sizes -Exercise: no regular exercise -Taking folic acid, vitamin D or calcium: no -Diabetes and Dyslipidemia Screening: Fasting for labs -Vaccines: see vaccine section EPIC -pap history: Sees GYN, Dr. Cletis Media -FDLMP: see nursing notes -sexual activity: yes, female partner, no new partners -wants STI testing (Hep C if born [birth year]): no -FH breast, colon or ovarian ca: see FH Last mammogram: plans to do with gyn Last colon cancer screening: Not applicable Breast Ca Risk Assessment: see family history and pt history DEXA (>/= 65): Not applicable  -Alcohol, Tobacco, drug use: see social history  Review of Systems - no fevers, unintentional weight loss, vision loss, hearing loss, chest pain, sob, hemoptysis, melena, hematochezia, hematuria, genital discharge, changing or concerning skin lesions, bleeding, bruising, loc, thoughts of self harm or SI  Past Medical History:  Diagnosis Date  . Hypertension    under control with med., has been on med. x 2 yr.  . Hypertrophy of breast 03/2013  . Obesity     Past Surgical History:  Procedure Laterality Date  . BREAST REDUCTION SURGERY Bilateral 04/26/2013   Procedure: BILATERAL BREAST REDUCTION WITH  LIPOSUCTION;  Surgeon: Cristine Polio, MD;  Location: [facility];  Service: Plastics;  Laterality: Bilateral;  . CHOLECYSTECTOMY N/A 05/10/2016   Procedure: LAPAROSCOPIC CHOLECYSTECTOMY WITH INTRAOPERATIVE CHOLANGIOGRAM;  Surgeon: Donnie Mesa, MD;  Location: [facility];  Service: General;  Laterality: N/A;  . FOOT SURGERY  2003; 2004   each foot    Family History  Problem Relation Age of Onset  . Cancer Father        Prostate  . Heart disease Father   . Migraines Mother   . Cancer Paternal Grandmother        blood?  . Cancer Paternal Grandfather        prostate    Social History   Socioeconomic History  . Marital status: Married    Spouse name: Not on file  . Number of children: Not on file  . Years of education: Not on file  . Highest education level: Not on file  Occupational History  . Not on file  Social Needs  . Financial resource strain: Not on file  . Food insecurity:    Worry: Not on file    Inability: Not on file  . Transportation needs:    Medical: Not on file    Non-medical: Not on file  Tobacco Use  . Smoking status: Never Smoker  . Smokeless tobacco: Never Used  Substance and Sexual Activity  . Alcohol use: Yes    Comment: rarely  . Drug use: No  . Sexual activity: Yes    Birth control/protection: IUD  Lifestyle  . Physical activity:  Days per week: Not on file    Minutes per session: Not on file  . Stress: Not on file  Relationships  . Social connections:    Talks on phone: Not on file    Gets together: Not on file    Attends religious service: Not on file    Active member of club or organization: Not on file    Attends meetings of clubs or organizations: Not on file    Relationship status: Not on file  Other Topics Concern  . Not on file  Social History Narrative  . Not on file     Current Outpatient Medications:  .  levonorgestrel (MIRENA) 20 MCG/24HR IUD, 1 each by Intrauterine route once., Disp: , Rfl:  .   lisinopril-hydrochlorothiazide (PRINZIDE,ZESTORETIC) 10-12.5 MG tablet, TAKE 1 TABLET BY MOUTH EVERY MORNING., Disp: 90 tablet, Rfl: 1  EXAM:  Vitals:   11/03/17 1035  BP: 100/80  Pulse: 80  Temp: 98.4 F (36.9 C)  Body mass index is 38.82 kg/m.   GENERAL: vitals reviewed and listed below, alert, oriented, appears well hydrated and in no acute distress  HEENT: head atraumatic, PERRLA, normal appearance of eyes, ears, nose and mouth. moist mucus membranes.  NECK: supple, no masses or lymphadenopathy  LUNGS: clear to auscultation bilaterally, no rales, rhonchi or wheeze  CV: HRRR, no peripheral edema or cyanosis, normal pedal pulses  ABDOMEN: bowel sounds normal, soft, non tender to palpation, no masses, no rebound or guarding  GU/BREAST: Declined, sees GYN  SKIN: no rash or abnormal lesions  MS: normal gait, moves all extremities normally  NEURO: normal gait, speech and thought processing grossly intact, muscle tone grossly intact throughout  PSYCH: normal affect, pleasant and cooperative  ASSESSMENT AND PLAN:  Discussed the following assessment and plan:  PREVENTIVE EXAM: -Discussed and advised all [place] preventive services health task force level A and B recommendations for age, sex and risks. -Advised at least 150 minutes of exercise per week and a healthy diet with avoidance of (less then 1 serving per week) processed foods, white starches, red meat, fast foods and sweets and consisting of: * 5-9 servings of fresh fruits and vegetables (not corn or potatoes) *nuts and seeds, beans *olives and olive oil *lean meats such as fish and white chicken  *whole grains -labs, studies and vaccines per orders this encounter  2. Screening for depression -Negative  3. Essential hypertension -Refills provided -Continue current treatment, also advised healthy lifestyle - Basic metabolic panel - CBC  4. Morbid obesity ([place]) -Advised a healthy diet and regular exercise, see  patient instructions - Hemoglobin A1c - Lipid panel   Patient advised to return to clinic immediately if symptoms worsen or persist or new concerns.  Patient Instructions  BEFORE YOU LEAVE: -Labs -follow up: 4 months  We have ordered labs or studies at this visit. It can take up to 1-2 weeks for results and processing. IF results require follow up or explanation, we will call you with instructions. Clinically stable results will be released to your [facility]. If you have not heard from [place] or cannot find your results in [facility] in 2 weeks please contact our office at [phone].  If you are not yet signed up for [facility], please consider signing up.   We recommend the following healthy lifestyle for LIFE: 1) Small portions. But, make sure to get regular (at least 3 per day), healthy meals and small healthy snacks if needed.  2) Eat a healthy clean diet.  TRY TO EAT: -at least 5-7 servings of low sugar, colorful, and nutrient rich vegetables per day (not corn, potatoes or bananas.) -berries are the best choice if you wish to eat fruit (only eat small amounts if trying to reduce weight)  -lean meets (fish, white meat of chicken or [place]) -vegan proteins for some meals - beans or tofu, whole grains, nuts and seeds -Replace bad fats with good fats - good fats include: fish, nuts and seeds, canola oil, olive oil -small amounts of low fat or non fat dairy -small amounts of100 % whole grains - check the lables -drink plenty of water  AVOID: -SUGAR, sweets, anything with added sugar, corn syrup or sweeteners - must read labels as even foods advertised as "healthy" often are loaded with sugar -if you must have a sweetener, small amounts of stevia may be best -sweetened beverages and artificially sweetened beverages -simple starches (rice, bread, potatoes, pasta, chips, etc - small amounts of 100% whole grains are ok) -red meat, pork, butter -fried foods, fast food, processed food,  excessive dairy, eggs and coconut.  3)Get at least 150 minutes of sweaty aerobic exercise per week.  4)Reduce stress - consider counseling, meditation and relaxation to balance other aspects of your life.          No follow-ups on file.  Lucretia Kern, DO

## 2017-11-03 ENCOUNTER — Encounter: Payer: Self-pay | Admitting: Family Medicine

## 2017-11-03 ENCOUNTER — Ambulatory Visit (INDEPENDENT_AMBULATORY_CARE_PROVIDER_SITE_OTHER): Payer: Federal, State, Local not specified - PPO | Admitting: Family Medicine

## 2017-11-03 VITALS — BP 100/80 | HR 80 | Temp 98.4°F | Ht 65.5 in | Wt 236.9 lb

## 2017-11-03 DIAGNOSIS — Z0000 Encounter for general adult medical examination without abnormal findings: Secondary | ICD-10-CM | POA: Diagnosis not present

## 2017-11-03 DIAGNOSIS — I10 Essential (primary) hypertension: Secondary | ICD-10-CM

## 2017-11-03 DIAGNOSIS — Z1331 Encounter for screening for depression: Secondary | ICD-10-CM

## 2017-11-03 LAB — LIPID PANEL
CHOLESTEROL: 150 mg/dL (ref 0–200)
HDL: 40.6 mg/dL (ref >39.00)
LDL Cholesterol: 95 mg/dL (ref 0–99)
NONHDL: 109.22
Total CHOL/HDL Ratio: 4
Triglycerides: 70 mg/dL (ref 0.0–149.0)
VLDL: 14 mg/dL (ref 0.0–40.0)

## 2017-11-03 LAB — BASIC METABOLIC PANEL
BUN: 10 mg/dL (ref 6–23)
CALCIUM: 9 mg/dL (ref 8.4–10.5)
CO2: 27 meq/L (ref 19–32)
CREATININE: 0.71 mg/dL (ref 0.40–1.20)
Chloride: 105 mEq/L (ref 96–112)
GFR: 116.87 mL/min (ref >60.00)
GLUCOSE: 97 mg/dL (ref 70–99)
Potassium: 4.2 mEq/L (ref 3.5–5.1)
SODIUM: 139 meq/L (ref 135–145)

## 2017-11-03 LAB — CBC
HCT: 37.9 % (ref 36.0–46.0)
Hemoglobin: 12.4 g/dL (ref 12.0–15.0)
MCHC: 32.6 g/dL (ref 30.0–36.0)
MCV: 88.5 fl (ref 78.0–100.0)
Platelets: 286 10*3/uL (ref 150.0–400.0)
RBC: 4.29 Mil/uL (ref 3.87–5.11)
RDW: 14.1 % (ref 11.5–15.5)
WBC: 9.8 10*3/uL (ref 4.0–10.5)

## 2017-11-03 LAB — HEMOGLOBIN A1C: HEMOGLOBIN A1C: 5.8 % (ref 4.6–6.5)

## 2017-11-03 NOTE — Patient Instructions (Signed)
BEFORE YOU LEAVE: -Labs -follow up: 4 months  We have ordered labs or studies at this visit. It can take up to 1-2 weeks for results and processing. IF results require follow up or explanation, we will call you with instructions. Clinically stable results will be released to your MYCHART. If you have not heard from us or cannot find your results in MYCHART in 2 weeks please contact our office at [phone].  If you are not yet signed up for MYCHART, please consider signing up.   We recommend the following healthy lifestyle for LIFE: 1) Small portions. But, make sure to get regular (at least 3 per day), healthy meals and small healthy snacks if needed.  2) Eat a healthy clean diet.   TRY TO EAT: -at least 5-7 servings of low sugar, colorful, and nutrient rich vegetables per day (not corn, potatoes or bananas.) -berries are the best choice if you wish to eat fruit (only eat small amounts if trying to reduce weight)  -lean meets (fish, white meat of chicken or turkey) -vegan proteins for some meals - beans or tofu, whole grains, nuts and seeds -Replace bad fats with good fats - good fats include: fish, nuts and seeds, canola oil, olive oil -small amounts of low fat or non fat dairy -small amounts of100 % whole grains - check the lables -drink plenty of water  AVOID: -SUGAR, sweets, anything with added sugar, corn syrup or sweeteners - must read labels as even foods advertised as "healthy" often are loaded with sugar -if you must have a sweetener, small amounts of stevia may be best -sweetened beverages and artificially sweetened beverages -simple starches (rice, bread, potatoes, pasta, chips, etc - small amounts of 100% whole grains are ok) -red meat, pork, butter -fried foods, fast food, processed food, excessive dairy, eggs and coconut.  3)Get at least 150 minutes of sweaty aerobic exercise per week.  4)Reduce stress - consider counseling, meditation and relaxation to balance other  aspects of your life.        

## 2017-12-01 DIAGNOSIS — Z6837 Body mass index (BMI) 37.0-37.9, adult: Secondary | ICD-10-CM | POA: Diagnosis not present

## 2017-12-01 DIAGNOSIS — Z01419 Encounter for gynecological examination (general) (routine) without abnormal findings: Secondary | ICD-10-CM | POA: Diagnosis not present

## 2017-12-01 DIAGNOSIS — Z3040 Encounter for surveillance of contraceptives, unspecified: Secondary | ICD-10-CM | POA: Diagnosis not present

## 2017-12-01 DIAGNOSIS — D259 Leiomyoma of uterus, unspecified: Secondary | ICD-10-CM | POA: Diagnosis not present

## 2017-12-01 DIAGNOSIS — Z1231 Encounter for screening mammogram for malignant neoplasm of breast: Secondary | ICD-10-CM | POA: Diagnosis not present

## 2018-01-26 DIAGNOSIS — K080 Exfoliation of teeth due to systemic causes: Secondary | ICD-10-CM | POA: Diagnosis not present

## 2018-03-08 NOTE — Progress Notes (Signed)
HPI:  Using dictation device. Unfortunately this device frequently misinterprets words/phrases.  Michele [name] is a pleasant 42 y.o. here for follow up. Chronic medical problems summarized below were reviewed for changes and stability and were updated as needed below. These issues and their treatment remain stable for the most part.  She reports she is not getting any regular exercise currently.  She is trying to do better with her diet, weaknesses carbs and ice cream.  She is trying to wean off of these.  She feels like her blood pressure may be too low at times.  She has had a few episodes with positional changes where she feels dizzy.  Reports a nurse checks her blood pressure weekly and it does tend to run low.  She also has had some pain in her right ankle.  This was out of the blue a few days ago.  Denies any swelling, weakness, numbness with this.  Denies CP, SOB, DOE, treatment intolerance or new symptoms.   HTN: -meds lisin-hctz  Obesity/Prediabetes: -advised lifestyle changes:  Sees Dr. Cletis Media in gyn.  ROS: See pertinent positives and negatives per HPI.  Past Medical History:  Diagnosis Date  . Hypertension    under control with med., has been on med. x 2 yr.  . Hypertrophy of breast 03/2013  . Obesity     Past Surgical History:  Procedure Laterality Date  . BREAST REDUCTION SURGERY Bilateral 04/26/2013   Procedure: BILATERAL BREAST REDUCTION WITH LIPOSUCTION;  Surgeon: Cristine Polio, MD;  Location: [facility];  Service: Plastics;  Laterality: Bilateral;  . CHOLECYSTECTOMY N/A 05/10/2016   Procedure: LAPAROSCOPIC CHOLECYSTECTOMY WITH INTRAOPERATIVE CHOLANGIOGRAM;  Surgeon: Donnie Mesa, MD;  Location: [facility];  Service: General;  Laterality: N/A;  . FOOT SURGERY  2003; 2004   each foot    Family History  Problem Relation Age of Onset  . Cancer Father        Prostate  . Heart disease Father   . Migraines Mother   . Cancer Paternal Grandmother         blood?  . Cancer Paternal Grandfather        prostate    SOCIAL HX: See HPI   Current Outpatient Medications:  .  levonorgestrel (MIRENA) 20 MCG/24HR IUD, 1 each by Intrauterine route once., Disp: , Rfl:  .  hydrochlorothiazide (MICROZIDE) 12.5 MG capsule, Take 1 capsule (12.5 mg total) by mouth daily., Disp: 90 capsule, Rfl: 1  EXAM:  Vitals:   03/09/18 1026  BP: 100/68  Pulse: 83  Temp: 98.4 F (36.9 C)    Body mass index is 37.92 kg/m.  GENERAL: vitals reviewed and listed above, alert, oriented, appears well hydrated and in no acute distress  HEENT: atraumatic, conjunttiva clear, no obvious abnormalities on inspection of external nose and ears  NECK: no obvious masses on inspection  LUNGS: clear to auscultation bilaterally, no wheezes, rales or rhonchi, good air movement  CV: HRRR, no peripheral edema  MS: moves all extremities without noticeable abnormality, on inspection of the feet and ankles, she has severe pes planus with a valgus deformity of the foot, no significant swelling of the ankle, or tenderness to palpation, Tinel's negative, neurovascular intact distal.  PSYCH: pleasant and cooperative, no obvious depression or anxiety  ASSESSMENT AND PLAN:  Discussed the following assessment and plan:  Essential hypertension  Acute right ankle pain  Morbid obesity (HCC)  Hyperglycemia  -In terms of the foot pain, this is likely mechanical related  to her severe pes planus.  She wants to become more active and I have suggested a gait analysis to ensure proper foot wear which may help to prevent issues. -We will go ahead and reduce her blood pressure regimen given her orthostatic symptoms and low blood pressure.  Close follow-up in 1 to 2 months.  We will recheck labs then. -Lengthy discussion about lifestyle, recommended a healthy Mediterranean diet, reducing carbs and sugar or replacing with whole grains, and regular aerobic exercise. -Patient advised  to return or notify a doctor immediately if symptoms worsen or persist or new concerns arise.  Patient Instructions  BEFORE YOU LEAVE: -follow up: 1-2 months  STOP the combination blood pressure medication.  Start hydrochlorothiazide alone and take 12.5 mg daily. I sent this to the pharmacy.  Consider doing a gait analysis for proper shoes at [employer].  Please lose 20lbs.  We recommend the following healthy lifestyle for LIFE: 1) Small portions. But, make sure to get regular (at least 3 per day), healthy meals and small healthy snacks if needed.  2) Eat a healthy clean diet.   TRY TO EAT: -at least 5-7 servings of low sugar, colorful, and nutrient rich vegetables per day (not corn, potatoes or bananas.) -berries are the best choice if you wish to eat fruit (only eat small amounts if trying to reduce weight)  -lean meets (fish, white meat of chicken or [place]) -vegan proteins for some meals - beans or tofu, whole grains, nuts and [name] -Replace bad fats with good fats - good fats include: fish, nuts and [name], canola oil, olive oil -small amounts of low fat or non fat dairy -small amounts of100 % whole grains - check the lables -drink plenty of water  AVOID: -SUGAR, sweets, anything with added sugar, corn syrup or sweeteners - must read labels as even foods advertised as "healthy" often are loaded with sugar -if you must have a sweetener, small amounts of stevia may be best -sweetened beverages and artificially sweetened beverages -simple starches (rice, bread, potatoes, pasta, chips, etc - small amounts of 100% whole grains are ok) -red meat, pork, butter -fried foods, fast food, processed food, excessive dairy, eggs and coconut.  3)Get at least 150 minutes of sweaty aerobic exercise per week.  4)Reduce stress - consider counseling, meditation and relaxation to balance other aspects of your life.     Lucretia Kern, DO

## 2018-03-09 ENCOUNTER — Ambulatory Visit: Payer: Federal, State, Local not specified - PPO | Admitting: Family Medicine

## 2018-03-09 ENCOUNTER — Encounter: Payer: Self-pay | Admitting: Family Medicine

## 2018-03-09 VITALS — BP 100/68 | HR 83 | Temp 98.4°F | Ht 65.5 in | Wt 231.4 lb

## 2018-03-09 DIAGNOSIS — M25571 Pain in right ankle and joints of right foot: Secondary | ICD-10-CM | POA: Diagnosis not present

## 2018-03-09 DIAGNOSIS — I10 Essential (primary) hypertension: Secondary | ICD-10-CM

## 2018-03-09 DIAGNOSIS — R739 Hyperglycemia, unspecified: Secondary | ICD-10-CM | POA: Diagnosis not present

## 2018-03-09 MED ORDER — HYDROCHLOROTHIAZIDE 12.5 MG PO CAPS: 12.5000 mg | ORAL_CAPSULE | Freq: Every day | ORAL | 1 refills | Status: DC

## 2018-03-09 NOTE — Patient Instructions (Signed)
BEFORE YOU LEAVE: -follow up: 1-2 months  STOP the combination blood pressure medication.  Start hydrochlorothiazide alone and take 12.5 mg daily. I sent this to the pharmacy.  Consider doing a gait analysis for proper shoes at [employer].  Please lose 20lbs.  We recommend the following healthy lifestyle for LIFE: 1) Small portions. But, make sure to get regular (at least 3 per day), healthy meals and small healthy snacks if needed.  2) Eat a healthy clean diet.   TRY TO EAT: -at least 5-7 servings of low sugar, colorful, and nutrient rich vegetables per day (not corn, potatoes or bananas.) -berries are the best choice if you wish to eat fruit (only eat small amounts if trying to reduce weight)  -lean meets (fish, white meat of chicken or [place]) -vegan proteins for some meals - beans or tofu, whole grains, nuts and seeds -Replace bad fats with good fats - good fats include: fish, nuts and seeds, canola oil, olive oil -small amounts of low fat or non fat dairy -small amounts of100 % whole grains - check the lables -drink plenty of water  AVOID: -SUGAR, sweets, anything with added sugar, corn syrup or sweeteners - must read labels as even foods advertised as "healthy" often are loaded with sugar -if you must have a sweetener, small amounts of stevia may be best -sweetened beverages and artificially sweetened beverages -simple starches (rice, bread, potatoes, pasta, chips, etc - small amounts of 100% whole grains are ok) -red meat, pork, butter -fried foods, fast food, processed food, excessive dairy, eggs and coconut.  3)Get at least 150 minutes of sweaty aerobic exercise per week.  4)Reduce stress - consider counseling, meditation and relaxation to balance other aspects of your life.

## 2018-04-17 NOTE — Progress Notes (Signed)
HPI:  Using dictation device. Unfortunately this device frequently misinterprets words/phrases.  Follow up Hypertension: -reduced medications last visit due to lower BP and some orthostatic symptoms (was on a combo of lisinopril and hxtz) now on low dose hctz alone -has been working on lifestlye changes -due for labs today -reports:doing well, checking bp 1x per week at work and usually good, no dizziness, cp, sob   ROS: See pertinent positives and negatives per HPI.  Past Medical History:  Diagnosis Date  . Hypertension    under control with med., has been on med. x 2 yr.  . Hypertrophy of breast 03/2013  . Obesity     Past Surgical History:  Procedure Laterality Date  . BREAST REDUCTION SURGERY Bilateral 04/26/2013   Procedure: BILATERAL BREAST REDUCTION WITH LIPOSUCTION;  Surgeon: Cristine Polio, MD;  Location: [facility];  Service: Plastics;  Laterality: Bilateral;  . CHOLECYSTECTOMY N/A 05/10/2016   Procedure: LAPAROSCOPIC CHOLECYSTECTOMY WITH INTRAOPERATIVE CHOLANGIOGRAM;  Surgeon: Donnie Mesa, MD;  Location: [place] [facility];  Service: General;  Laterality: N/A;  . FOOT SURGERY  2003; 2004   each foot    Family History  Problem Relation Age of Onset  . Cancer Father        Prostate  . Heart disease Father   . Migraines Mother   . Cancer Paternal Grandmother        blood?  . Cancer Paternal Grandfather        prostate    SOCIAL HX: see hpi   Current Outpatient Medications:  .  hydrochlorothiazide (MICROZIDE) 12.5 MG capsule, Take 1 capsule (12.5 mg total) by mouth daily., Disp: 90 capsule, Rfl: 1 .  levonorgestrel (MIRENA) 20 MCG/24HR IUD, 1 each by Intrauterine route once., Disp: , Rfl:   EXAM:  Vitals:   04/20/18 1030  BP: 112/78  Pulse: 78  Temp: 98.6 F (37 C)    Body mass index is 38.18 kg/m.  GENERAL: vitals reviewed and listed above, alert, oriented, appears well hydrated and in no acute distress  HEENT: atraumatic, conjunttiva  clear, no obvious abnormalities on inspection of external nose and ears  NECK: no obvious masses on inspection  LUNGS: clear to auscultation bilaterally, no wheezes, rales or rhonchi, good air movement  CV: HRRR, no peripheral edema  MS: moves all extremities without noticeable abnormality  PSYCH: pleasant and cooperative, no obvious depression or anxiety  ASSESSMENT AND PLAN:  Discussed the following assessment and plan:  Essential hypertension - Plan: Basic metabolic panel  -cont current meds -lab per order -lifestyle recse -follow up 4 months -Patient advised to return or notify a doctor immediately if symptoms worsen or persist or new concerns arise.  Patient Instructions  BEFORE YOU LEAVE: -lab -follow up: 4 months  We have ordered labs or studies at this visit. It can take up to 1-2 weeks for results and processing. IF results require follow up or explanation, we will call you with instructions. Clinically stable results will be released to your [facility]. If you have not heard from [place] or cannot find your results in [facility] in 2 weeks please contact our office at [phone].  If you are not yet signed up for [facility], please consider signing up.    We recommend the following healthy lifestyle for LIFE: 1) Small portions. But, make sure to get regular (at least 3 per day), healthy meals and small healthy snacks if needed.  2) Eat a healthy clean diet.   TRY TO EAT: -at least 5-7  servings of low sugar, colorful, and nutrient rich vegetables per day (not corn, potatoes or bananas.) -berries are the best choice if you wish to eat fruit (only eat small amounts if trying to reduce weight)  -lean meets (fish, white meat of chicken or [place]) -vegan proteins for some meals - beans or tofu, whole grains, nuts and seeds -Replace bad fats with good fats - good fats include: fish, nuts and seeds, canola oil, olive oil -small amounts of low fat or non fat dairy -small amounts  of100 % whole grains - check the lables -drink plenty of water  AVOID: -SUGAR, sweets, anything with added sugar, corn syrup or sweeteners - must read labels as even foods advertised as "healthy" often are loaded with sugar -if you must have a sweetener, small amounts of stevia may be best -sweetened beverages and artificially sweetened beverages -simple starches (rice, bread, potatoes, pasta, chips, etc - small amounts of 100% whole grains are ok) -red meat, pork, butter -fried foods, fast food, processed food, excessive dairy, eggs and coconut.  3)Get at least 150 minutes of sweaty aerobic exercise per week.  4)Reduce stress - consider counseling, meditation and relaxation to balance other aspects of your life.         Lucretia Kern, DO

## 2018-04-20 ENCOUNTER — Encounter: Payer: Self-pay | Admitting: Family Medicine

## 2018-04-20 ENCOUNTER — Ambulatory Visit: Payer: Federal, State, Local not specified - PPO | Admitting: Family Medicine

## 2018-04-20 VITALS — BP 112/78 | HR 78 | Temp 98.6°F | Ht 65.5 in | Wt 233.0 lb

## 2018-04-20 DIAGNOSIS — I10 Essential (primary) hypertension: Secondary | ICD-10-CM | POA: Diagnosis not present

## 2018-04-20 LAB — BASIC METABOLIC PANEL
BUN: 13 mg/dL (ref 6–23)
CHLORIDE: 104 meq/L (ref 96–112)
CO2: 28 mEq/L (ref 19–32)
Calcium: 9.6 mg/dL (ref 8.4–10.5)
Creatinine, Ser: 0.78 mg/dL (ref 0.40–1.20)
GFR: 104.61 mL/min (ref >60.00)
Glucose, Bld: 104 mg/dL — ABNORMAL HIGH (ref 70–99)
Potassium: 3.8 mEq/L (ref 3.5–5.1)
Sodium: 140 mEq/L (ref 135–145)

## 2018-04-20 NOTE — Patient Instructions (Signed)
BEFORE YOU LEAVE: -lab -follow up: 4 months  We have ordered labs or studies at this visit. It can take up to 1-2 weeks for results and processing. IF results require follow up or explanation, we will call you with instructions. Clinically stable results will be released to your [facility]. If you have not heard from [place] or cannot find your results in [facility] in 2 weeks please contact our office at [phone].  If you are not yet signed up for [facility], please consider signing up.    We recommend the following healthy lifestyle for LIFE: 1) Small portions. But, make sure to get regular (at least 3 per day), healthy meals and small healthy snacks if needed.  2) Eat a healthy clean diet.   TRY TO EAT: -at least 5-7 servings of low sugar, colorful, and nutrient rich vegetables per day (not corn, potatoes or bananas.) -berries are the best choice if you wish to eat fruit (only eat small amounts if trying to reduce weight)  -lean meets (fish, white meat of chicken or [place]) -vegan proteins for some meals - beans or tofu, whole grains, nuts and seeds -Replace bad fats with good fats - good fats include: fish, nuts and seeds, canola oil, olive oil -small amounts of low fat or non fat dairy -small amounts of100 % whole grains - check the lables -drink plenty of water  AVOID: -SUGAR, sweets, anything with added sugar, corn syrup or sweeteners - must read labels as even foods advertised as "healthy" often are loaded with sugar -if you must have a sweetener, small amounts of stevia may be best -sweetened beverages and artificially sweetened beverages -simple starches (rice, bread, potatoes, pasta, chips, etc - small amounts of 100% whole grains are ok) -red meat, pork, butter -fried foods, fast food, processed food, excessive dairy, eggs and coconut.  3)Get at least 150 minutes of sweaty aerobic exercise per week.  4)Reduce stress - consider counseling, meditation and relaxation to balance other  aspects of your life.

## 2018-08-20 ENCOUNTER — Ambulatory Visit (INDEPENDENT_AMBULATORY_CARE_PROVIDER_SITE_OTHER): Payer: Federal, State, Local not specified - PPO | Admitting: Family Medicine

## 2018-08-20 ENCOUNTER — Other Ambulatory Visit: Payer: Self-pay

## 2018-08-20 ENCOUNTER — Encounter: Payer: Self-pay | Admitting: Family Medicine

## 2018-08-20 VITALS — Wt 233.0 lb

## 2018-08-20 DIAGNOSIS — E669 Obesity, unspecified: Secondary | ICD-10-CM

## 2018-08-20 DIAGNOSIS — I10 Essential (primary) hypertension: Secondary | ICD-10-CM

## 2018-08-20 NOTE — Progress Notes (Signed)
Virtual Visit via Video Note  I connected with Shivani  on 08/20/18 at  4:15 PM EDT by a video enabled telemedicine application and verified that I am speaking with the correct person using two identifiers.  Location patient: home Location provider:work or home office Persons participating in the virtual visit: patient, provider  I discussed the limitations of evaluation and management by telemedicine and the availability of in person appointments. The patient expressed understanding and agreed to proceed.   HPI:  Follow up HTN/Obesity: -taking hctz -BP was 110/72 at her office a few weeks ago; she thinks home cuff is not working as showing higher readings in the 130-150/80-90 range -she is less active since working from home during the [place] pandemic -no regular aerobic exercise, some wi-fit -she feels the diet has been better since coming home -no CP, SOB, DOE, HA  ROS: See pertinent positives and negatives per HPI.  Past Medical History:  Diagnosis Date  . Hypertension    under control with med., has been on med. x 2 yr.  . Hypertrophy of breast 03/2013  . Obesity     Past Surgical History:  Procedure Laterality Date  . BREAST REDUCTION SURGERY Bilateral 04/26/2013   Procedure: BILATERAL BREAST REDUCTION WITH LIPOSUCTION;  Surgeon: Cristine Polio, MD;  Location: [place];  Service: Plastics;  Laterality: Bilateral;  . CHOLECYSTECTOMY N/A 05/10/2016   Procedure: LAPAROSCOPIC CHOLECYSTECTOMY WITH INTRAOPERATIVE CHOLANGIOGRAM;  Surgeon: Donnie Mesa, MD;  Location: [place] [facility];  Service: General;  Laterality: N/A;  . FOOT SURGERY  2003; 2004   each foot    Family History  Problem Relation Age of Onset  . Cancer Father        Prostate  . Heart disease Father   . Migraines Mother   . Cancer Paternal Grandmother        blood?  . Cancer Paternal Grandfather        prostate    SOCIAL HX: see hpi   Current Outpatient Medications:  .   hydrochlorothiazide (MICROZIDE) 12.5 MG capsule, Take 1 capsule (12.5 mg total) by mouth daily., Disp: 90 capsule, Rfl: 1 .  levonorgestrel (MIRENA) 20 MCG/24HR IUD, 1 each by Intrauterine route once., Disp: , Rfl:   EXAM:  VITALS per patient if applicable: 373/42, wt 876  Today's Vitals   08/20/18 1649  Weight: 233 lb (105.7 kg)   Body mass index is 38.18 kg/m.   GENERAL: alert, oriented, appears well and in no acute distress  HEENT: atraumatic, conjunttiva clear, no obvious abnormalities on inspection of external nose and ears  NECK: normal movements of the head and neck  LUNGS: on inspection no signs of respiratory distress, breathing rate appears normal, no obvious gross SOB, gasping or wheezing  CV: no obvious cyanosis  MS: moves all visible extremities without noticeable abnormality  PSYCH/NEURO: pleasant and cooperative, no obvious depression or anxiety, speech and thought processing grossly intact  ASSESSMENT AND PLAN:  Discussed the following assessment and plan:  Essential hypertension  Obesity (BMI 30-39.9)  Opted to get new cuff and monitor BP at home with follow up in 3-4 weeks to review log and decide if further treatment needed. In the meantime continue hctz and advised healthy diet and regular aerobic exercise. Discussed ways to do this despite the covid19 pandemic.   I discussed the assessment and treatment plan with the patient. The patient was provided an opportunity to ask questions and all were answered. The patient agreed with the plan and demonstrated  an understanding of the instructions.   The patient was advised to call back or seek an in-person evaluation if the symptoms worsen or if the condition fails to improve as anticipated.   Follow up instructions: Advised assistant Wendie Simmer to help patient arrange the following: -rx for BP cuff to use flex dollars, can use my signature stamp -follow up with Dr. Maudie Mercury in 3-4 weeks Lucretia Kern, DO

## 2018-08-29 ENCOUNTER — Other Ambulatory Visit: Payer: Self-pay | Admitting: Family Medicine

## 2018-09-03 ENCOUNTER — Telehealth: Payer: Self-pay | Admitting: *Deleted

## 2018-09-03 NOTE — Telephone Encounter (Signed)
Per office notes from 4/23, I called the pt and scheduled a follow up phone visit on 5/28.  Patient was given the number for Mychart to activate her acct.  Patient stated she has a form from [facility] that has to be completed for the BP cuff at home and will send this via Mychart.

## 2018-09-04 ENCOUNTER — Encounter: Payer: Self-pay | Admitting: Family Medicine

## 2018-09-24 ENCOUNTER — Other Ambulatory Visit: Payer: Self-pay

## 2018-09-24 ENCOUNTER — Ambulatory Visit (INDEPENDENT_AMBULATORY_CARE_PROVIDER_SITE_OTHER): Payer: Federal, State, Local not specified - PPO | Admitting: Family Medicine

## 2018-09-24 ENCOUNTER — Encounter: Payer: Self-pay | Admitting: Family Medicine

## 2018-09-24 VITALS — BP 118/84 | HR 83

## 2018-09-24 DIAGNOSIS — E669 Obesity, unspecified: Secondary | ICD-10-CM | POA: Diagnosis not present

## 2018-09-24 DIAGNOSIS — I10 Essential (primary) hypertension: Secondary | ICD-10-CM

## 2018-09-24 DIAGNOSIS — R739 Hyperglycemia, unspecified: Secondary | ICD-10-CM | POA: Diagnosis not present

## 2018-09-24 MED ORDER — HYDROCHLOROTHIAZIDE 25 MG PO TABS: 25.0000 mg | ORAL_TABLET | Freq: Every day | ORAL | 3 refills | Status: DC

## 2018-09-24 NOTE — Progress Notes (Signed)
Virtual Visit via Video Note  I connected with Michele [name]  on 09/24/18 at  3:00 PM EDT by a video enabled telemedicine application and verified that I am speaking with the correct person using two identifiers.  Location patient: home Location provider:work or home office Persons participating in the virtual visit: patient, provider  I discussed the limitations of evaluation and management by telemedicine and the availability of in person appointments. The patient expressed understanding and agreed to proceed.   HPI:  Follow up HTN: -she got a blood pressure cuff -has been running around 110-130/80-90s, mostly 90s DBP -reports eating a little better, not exercising as much since rain started -denies CP, SOB, DOE, HA, swelling  ROS: See pertinent positives and negatives per HPI.  Past Medical History:  Diagnosis Date  . Hypertension    under control with med., has been on med. x 2 yr.  . Hypertrophy of breast 03/2013  . Obesity     Past Surgical History:  Procedure Laterality Date  . BREAST REDUCTION SURGERY Bilateral 04/26/2013   Procedure: BILATERAL BREAST REDUCTION WITH LIPOSUCTION;  Surgeon: Cristine Polio, MD;  Location: [place];  Service: Plastics;  Laterality: Bilateral;  . CHOLECYSTECTOMY N/A 05/10/2016   Procedure: LAPAROSCOPIC CHOLECYSTECTOMY WITH INTRAOPERATIVE CHOLANGIOGRAM;  Surgeon: Donnie Mesa, MD;  Location: [place] [facility];  Service: General;  Laterality: N/A;  . FOOT SURGERY  2003; 2004   each foot    Family History  Problem Relation Age of Onset  . Cancer Father        Prostate  . Heart disease Father   . Migraines Mother   . Cancer Paternal Grandmother        blood?  . Cancer Paternal Grandfather        prostate    SOCIAL HX: see hpi   Current Outpatient Medications:  .  levonorgestrel (MIRENA) 20 MCG/24HR IUD, 1 each by Intrauterine route once., Disp: , Rfl:  .  hydrochlorothiazide (HYDRODIURIL) 25 MG tablet, Take 1 tablet (25 mg total)  by mouth daily., Disp: 90 tablet, Rfl: 3  EXAM:  VITALS per patient if applicable:  Today's Vitals   09/24/18 1430  BP: 118/84  Pulse: 83   There is no height or weight on file to calculate BMI.   GENERAL: alert, oriented, appears well and in no acute distress  HEENT: atraumatic, conjunttiva clear, no obvious abnormalities on inspection of external nose and ears  NECK: normal movements of the head and neck  LUNGS: on inspection no signs of respiratory distress, breathing rate appears normal, no obvious gross SOB, gasping or wheezing  CV: no obvious cyanosis  MS: moves all visible extremities without noticeable abnormality  PSYCH/NEURO: pleasant and cooperative, no obvious depression or anxiety, speech and thought processing grossly intact  ASSESSMENT AND PLAN:  Discussed the following assessment and plan:  Essential hypertension - Plan: Basic metabolic panel, CBC, Lipid panel  Obesity (BMI 30-39.9) - Plan: Lipid panel  Hyperglycemia - Plan: Hemoglobin A1c  Discussed options, DBP running high on all readings and in the 90s at times. BP today lower then all of her other readings. She opted to try increaseing the diuretic to 25mg  daily and agrees to work on diet and exercise. Goal of 10-20lb weight reduction with healthy whole foods based diet such as Mediterranean, DASH or keto. Labs 1 month with follow up after.   I discussed the assessment and treatment plan with the patient. The patient was provided an opportunity to ask questions and all  were answered. The patient agreed with the plan and demonstrated an understanding of the instructions.   The patient was advised to call back or seek an in-person evaluation if the symptoms worsen or if the condition fails to improve as anticipated.   Follow up instructions: Advised assistant Wendie Simmer to help patient arrange the following: -labs in 1 month -follow up with Dr. Maudie Mercury in 5-6 weeks  Michele Kern, DO

## 2018-09-24 NOTE — Patient Instructions (Signed)
Increase the Hydrochlorothiazide to 25mg  daily.  Eat a health low sugar diet such as DASH, Mediterreanean or keto and get at least 150 minutes of aerobic exercise daily.  Labs in 1 month, follow up 4-6 weeks.

## 2018-10-26 ENCOUNTER — Other Ambulatory Visit (INDEPENDENT_AMBULATORY_CARE_PROVIDER_SITE_OTHER): Payer: Federal, State, Local not specified - PPO

## 2018-10-26 ENCOUNTER — Other Ambulatory Visit: Payer: Self-pay

## 2018-10-26 DIAGNOSIS — R739 Hyperglycemia, unspecified: Secondary | ICD-10-CM | POA: Diagnosis not present

## 2018-10-26 DIAGNOSIS — I10 Essential (primary) hypertension: Secondary | ICD-10-CM

## 2018-10-26 DIAGNOSIS — E669 Obesity, unspecified: Secondary | ICD-10-CM | POA: Diagnosis not present

## 2018-10-26 LAB — BASIC METABOLIC PANEL
BUN: 10 mg/dL (ref 6–23)
CO2: 25 mEq/L (ref 19–32)
Calcium: 9 mg/dL (ref 8.4–10.5)
Chloride: 103 mEq/L (ref 96–112)
Creatinine, Ser: 0.77 mg/dL (ref 0.40–1.20)
GFR: 99.65 mL/min (ref >60.00)
Glucose, Bld: 135 mg/dL — ABNORMAL HIGH (ref 70–99)
Potassium: 3.4 mEq/L — ABNORMAL LOW (ref 3.5–5.1)
Sodium: 140 mEq/L (ref 135–145)

## 2018-10-26 LAB — LIPID PANEL
Cholesterol: 136 mg/dL (ref 0–200)
HDL: 37 mg/dL — ABNORMAL LOW (ref >39.00)
LDL Cholesterol: 84 mg/dL (ref 0–99)
NonHDL: 99.08
Total CHOL/HDL Ratio: 4
Triglycerides: 76 mg/dL (ref 0.0–149.0)
VLDL: 15.2 mg/dL (ref 0.0–40.0)

## 2018-10-26 LAB — CBC
HCT: 37.9 % (ref 36.0–46.0)
Hemoglobin: 12.5 g/dL (ref 12.0–15.0)
MCHC: 33 g/dL (ref 30.0–36.0)
MCV: 86.9 fl (ref 78.0–100.0)
Platelets: 284 10*3/uL (ref 150.0–400.0)
RBC: 4.36 Mil/uL (ref 3.87–5.11)
RDW: 13.6 % (ref 11.5–15.5)
WBC: 8.5 10*3/uL (ref 4.0–10.5)

## 2018-10-26 LAB — HEMOGLOBIN A1C: Hgb A1c MFr Bld: 6.1 % (ref 4.6–6.5)

## 2018-11-03 ENCOUNTER — Other Ambulatory Visit: Payer: Self-pay

## 2018-11-03 ENCOUNTER — Encounter: Payer: Self-pay | Admitting: Family Medicine

## 2018-11-03 ENCOUNTER — Telehealth: Payer: Self-pay | Admitting: *Deleted

## 2018-11-03 ENCOUNTER — Ambulatory Visit (INDEPENDENT_AMBULATORY_CARE_PROVIDER_SITE_OTHER): Payer: Federal, State, Local not specified - PPO | Admitting: Family Medicine

## 2018-11-03 DIAGNOSIS — I10 Essential (primary) hypertension: Secondary | ICD-10-CM

## 2018-11-03 DIAGNOSIS — E669 Obesity, unspecified: Secondary | ICD-10-CM | POA: Diagnosis not present

## 2018-11-03 DIAGNOSIS — R739 Hyperglycemia, unspecified: Secondary | ICD-10-CM | POA: Diagnosis not present

## 2018-11-03 NOTE — Telephone Encounter (Signed)
-----   Message from Lucretia Kern, DO sent at 11/03/2018  3:18 PM EDT ----- -follow up or CPE per pt preference in 3-4 months

## 2018-11-03 NOTE — Telephone Encounter (Signed)
I called the pt and scheduled a physical on 10/9.

## 2018-11-03 NOTE — Telephone Encounter (Signed)
Appt scheduled on 10/9.

## 2018-11-03 NOTE — Patient Instructions (Signed)
  We recommend the following healthy lifestyle for LIFE: 1) Small portions. But, make sure to get regular (at least 3 per day), healthy meals and small healthy snacks if needed.  2) Eat a healthy clean diet.   TRY TO EAT: -at least 5-7 servings of low sugar, colorful, and nutrient rich vegetables per day (not corn, potatoes or bananas.) -berries are the best choice if you wish to eat fruit (only eat small amounts if trying to reduce weight)  -lean meets (fish, white meat of chicken or [place]) -vegan proteins for some meals - beans or tofu, whole grains, nuts and seeds -Replace bad fats with good fats - good fats include: fish, nuts and seeds, canola oil, olive oil -small amounts of low fat or non fat dairy -small amounts of100 % whole grains - check the lables -drink plenty of water  AVOID: -SUGAR, sweets, anything with added sugar, corn syrup or sweeteners - must read labels as even foods advertised as "healthy" often are loaded with sugar -if you must have a sweetener, small amounts of stevia may be best -sweetened beverages and artificially sweetened beverages -simple starches (rice, bread, potatoes, pasta, chips, etc - small amounts of 100% whole grains are ok) -red meat, pork, butter -fried foods, fast food, processed food, excessive dairy, eggs and coconut.  3)Get at least 150 minutes of sweaty aerobic exercise per week.  4)Reduce stress - consider counseling, meditation and relaxation to balance other aspects of your life.

## 2018-11-03 NOTE — Telephone Encounter (Signed)
-----   Message from Lucretia Kern, DO sent at 11/03/2018  3:18 PM EDT ----- Can you call Avel Peace - we lost connection right at the end of the visit. She can do follow up with me or CPE w/ Dr. Ethlyn Gallery in 3 months. Thanks!

## 2018-11-03 NOTE — Progress Notes (Signed)
Virtual Visit via Video Note  I connected with   on 11/03/18 at  3:00 PM EDT by a video enabled telemedicine application and verified that I am speaking with the correct person using two identifiers.  Location patient: parents home Location provider:work or home office Persons participating in the virtual visit: patient, provider  I discussed the limitations of evaluation and management by telemedicine and the availability of in person appointments. The patient expressed understanding and agreed to proceed.   HPI:  Follow up HTN, Obesity, Hyperglycemia: -increased diuretic and advised lifestyle changes last visit -today reports: BP running in 120s/low 80s, exercising a little -denies:SOB, HA, cp currently -BP 128/86 today -wt is 234   ROS: See pertinent positives and negatives per HPI.  Past Medical History:  Diagnosis Date  . Hypertension    under control with med., has been on med. x 2 yr.  . Hypertrophy of breast 03/2013  . Obesity     Past Surgical History:  Procedure Laterality Date  . BREAST REDUCTION SURGERY Bilateral 04/26/2013   Procedure: BILATERAL BREAST REDUCTION WITH LIPOSUCTION;  Surgeon: Cristine Polio, MD;  Location: [place];  Service: Plastics;  Laterality: Bilateral;  . CHOLECYSTECTOMY N/A 05/10/2016   Procedure: LAPAROSCOPIC CHOLECYSTECTOMY WITH INTRAOPERATIVE CHOLANGIOGRAM;  Surgeon: Donnie Mesa, MD;  Location: [place] [facility];  Service: General;  Laterality: N/A;  . FOOT SURGERY  2003; 2004   each foot    Family History  Problem Relation Age of Onset  . Cancer Father        Prostate  . Heart disease Father   . Migraines Mother   . Cancer Paternal Grandmother        blood?  . Cancer Paternal Grandfather        prostate    SOCIAL HX: see hpi   Current Outpatient Medications:  .  hydrochlorothiazide (HYDRODIURIL) 25 MG tablet, Take 1 tablet (25 mg total) by mouth daily., Disp: 90 tablet, Rfl: 3 .  levonorgestrel (MIRENA) 20  MCG/24HR IUD, 1 each by Intrauterine route once., Disp: , Rfl:   EXAM:  VITALS per patient if applicable: BP 888/28  GENERAL: alert, oriented, appears well and in no acute distress  HEENT: atraumatic, conjunttiva clear, no obvious abnormalities on inspection of external nose and ears  NECK: normal movements of the head and neck  LUNGS: on inspection no signs of respiratory distress, breathing rate appears normal, no obvious gross SOB, gasping or wheezing  CV: no obvious cyanosis  MS: moves all visible extremities without noticeable abnormality  PSYCH/NEURO: pleasant and cooperative, no obvious depression or anxiety, speech and thought processing grossly intact  ASSESSMENT AND PLAN:  Discussed the following assessment and plan:  Essential hypertension Obesity (BMI 30-39.9)  Hyperglycemia  -cont current BP medication -lifestyle recs with 10lb weight reduction goal for next visit -healthy diet and exercise discussed -follow up or CPE per pt preference in 3-4 months   I discussed the assessment and treatment plan with the patient. The patient was provided an opportunity to ask questions and all were answered. The patient agreed with the plan and demonstrated an understanding of the instructions.   The patient was advised to call back or seek an in-person evaluation if the symptoms worsen or if the condition fails to improve as anticipated.   Follow up instructions: Advised assistant Wendie Simmer to help patient arrange the following: -follow up or CPE per pt preference in 3-4 months  Lucretia Kern, DO

## 2018-11-24 ENCOUNTER — Other Ambulatory Visit: Payer: Self-pay | Admitting: Family Medicine

## 2018-12-16 DIAGNOSIS — D259 Leiomyoma of uterus, unspecified: Secondary | ICD-10-CM | POA: Diagnosis not present

## 2018-12-16 DIAGNOSIS — Z1231 Encounter for screening mammogram for malignant neoplasm of breast: Secondary | ICD-10-CM | POA: Diagnosis not present

## 2018-12-16 DIAGNOSIS — Z124 Encounter for screening for malignant neoplasm of cervix: Secondary | ICD-10-CM | POA: Diagnosis not present

## 2018-12-16 DIAGNOSIS — Z01419 Encounter for gynecological examination (general) (routine) without abnormal findings: Secondary | ICD-10-CM | POA: Diagnosis not present

## 2018-12-16 DIAGNOSIS — Z3040 Encounter for surveillance of contraceptives, unspecified: Secondary | ICD-10-CM | POA: Diagnosis not present

## 2018-12-16 LAB — HM PAP SMEAR

## 2018-12-22 ENCOUNTER — Encounter: Payer: Self-pay | Admitting: Family Medicine

## 2019-01-15 ENCOUNTER — Other Ambulatory Visit: Payer: Self-pay

## 2019-01-15 DIAGNOSIS — Z20822 Contact with and (suspected) exposure to covid-19: Secondary | ICD-10-CM

## 2019-01-15 DIAGNOSIS — R6889 Other general symptoms and signs: Secondary | ICD-10-CM | POA: Diagnosis not present

## 2019-01-17 LAB — NOVEL CORONAVIRUS, NAA: SARS-CoV-2, NAA: NOT DETECTED

## 2019-02-05 ENCOUNTER — Other Ambulatory Visit: Payer: Self-pay

## 2019-02-05 ENCOUNTER — Ambulatory Visit (INDEPENDENT_AMBULATORY_CARE_PROVIDER_SITE_OTHER): Payer: Federal, State, Local not specified - PPO | Admitting: Family Medicine

## 2019-02-05 ENCOUNTER — Encounter: Payer: Self-pay | Admitting: Family Medicine

## 2019-02-05 VITALS — BP 100/72 | HR 104 | Temp 97.9°F | Ht 66.0 in | Wt 238.3 lb

## 2019-02-05 DIAGNOSIS — Z0000 Encounter for general adult medical examination without abnormal findings: Secondary | ICD-10-CM | POA: Diagnosis not present

## 2019-02-05 DIAGNOSIS — E669 Obesity, unspecified: Secondary | ICD-10-CM | POA: Diagnosis not present

## 2019-02-05 DIAGNOSIS — Z68.41 Body mass index (BMI) pediatric, greater than or equal to 95th percentile for age: Secondary | ICD-10-CM

## 2019-02-05 DIAGNOSIS — I10 Essential (primary) hypertension: Secondary | ICD-10-CM | POA: Diagnosis not present

## 2019-02-05 DIAGNOSIS — Z23 Encounter for immunization: Secondary | ICD-10-CM

## 2019-02-05 DIAGNOSIS — R739 Hyperglycemia, unspecified: Secondary | ICD-10-CM | POA: Diagnosis not present

## 2019-02-05 LAB — BASIC METABOLIC PANEL
BUN: 17 mg/dL (ref 6–23)
CO2: 29 mEq/L (ref 19–32)
Calcium: 9.6 mg/dL (ref 8.4–10.5)
Chloride: 101 mEq/L (ref 96–112)
Creatinine, Ser: 0.91 mg/dL (ref 0.40–1.20)
GFR: 82.07 mL/min (ref >60.00)
Glucose, Bld: 136 mg/dL — ABNORMAL HIGH (ref 70–99)
Potassium: 3.6 mEq/L (ref 3.5–5.1)
Sodium: 139 mEq/L (ref 135–145)

## 2019-02-05 LAB — HEMOGLOBIN A1C: Hgb A1c MFr Bld: 6.8 % — ABNORMAL HIGH (ref 4.6–6.5)

## 2019-02-05 NOTE — Progress Notes (Signed)
Michele [name] DOB: 08/05/1975 Encounter date: 02/05/2019  This is a 43 y.o. female who presents for complete physical   History of present illness/Additional concerns: Started having some pain shoulder blade left side - thinks slept funny. Better than yesterday.   [id number]. Potassium slightly low on previous bloodwork in June and sugar slightly elevated. Consider recheck today. Does check her bp at home - usually in [id number] systolic range.   Follows with [facility]; last pap was 11/2018. This was normal. Had mammogram same day as well which was normal.   Has been trying to be more mindful of what she is eating. Has decreased pork intake. Tries to do exercise on Wi with daughter. 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   Past Medical History:  Diagnosis Date  . Hypertension    under control with med., has been on med. x 2 yr.  . Hypertrophy of breast 03/2013  . Obesity    Past Surgical History:  Procedure Laterality Date  . BREAST REDUCTION SURGERY Bilateral 04/26/2013   Procedure: BILATERAL BREAST REDUCTION WITH LIPOSUCTION;  Surgeon: Cristine Polio, MD;  Location: [place];  Service: Plastics;  Laterality: Bilateral;  . CHOLECYSTECTOMY N/A 05/10/2016   Procedure: LAPAROSCOPIC CHOLECYSTECTOMY WITH INTRAOPERATIVE CHOLANGIOGRAM;  Surgeon: Donnie Mesa, MD;  Location: [place];  Service: General;  Laterality: N/A;  . FOOT SURGERY  2003; 2004   each foot - bunion, hammer toe   No Known Allergies Current Meds  Medication Sig  . hydrochlorothiazide (HYDRODIURIL) 25 MG tablet Take 1 tablet (25 mg total) by mouth daily.  Marland Kitchen levonorgestrel (MIRENA) 20 MCG/24HR IUD 1 each by Intrauterine route once.  . norethindrone (MICRONOR) 0.35 MG tablet Take 1 tablet by mouth daily.   Social History   Tobacco Use  . Smoking status: Never Smoker  . Smokeless tobacco: Never Used  Substance Use Topics  . Alcohol use: Yes    Comment: rarely   Family History  Problem  Relation Age of Onset  . Cancer Father 20       Prostate  . Heart disease Father   . Migraines Mother   . Arthritis Mother   . Deep vein thrombosis Mother        started after ortho surgery; lifetime  . Cancer Paternal Grandmother        blood?  . Cancer Paternal Grandfather        prostate  . Headache Brother   . Other Brother        ?something with heart; under eval  . Glaucoma Maternal Grandmother   . Head & neck cancer Maternal Grandmother   . Alzheimer's disease Maternal Grandfather      Review of Systems  Constitutional: Negative for activity change, appetite change, chills, fatigue, fever and unexpected weight change.  HENT: Negative for congestion, ear pain, hearing loss, sinus pressure, sinus pain, sore throat and trouble swallowing.   Eyes: Negative for pain and visual disturbance.  Respiratory: Negative for cough, chest tightness, shortness of breath and wheezing.   Cardiovascular: Negative for chest pain, palpitations and leg swelling.  Gastrointestinal: Negative for abdominal pain, blood in stool, constipation, diarrhea, nausea and vomiting.  Genitourinary: Negative for difficulty urinating and menstrual problem.  Musculoskeletal: Negative for arthralgias and back pain.  Skin: Negative for rash.  Neurological: Negative for dizziness, weakness, numbness and headaches.  Hematological: Negative for adenopathy. Does not bruise/bleed easily.  Psychiatric/Behavioral: Negative for sleep disturbance and suicidal ideas. The patient is not nervous/anxious.  CBC:  Lab Results  Component Value Date   WBC 8.5 10/26/2018   HGB 12.5 10/26/2018   HCT 37.9 10/26/2018   MCH 27.4 05/14/2016   MCHC 33.0 10/26/2018   RDW 13.6 10/26/2018   PLT 284.0 10/26/2018   CMP: Lab Results  Component Value Date   NA 139 02/05/2019   K 3.6 02/05/2019   CL 101 02/05/2019   CO2 29 02/05/2019   ANIONGAP 9 05/14/2016   GLUCOSE 136 (H) 02/05/2019   BUN 17 02/05/2019   CREATININE  0.91 02/05/2019   CREATININE 0.76 05/06/2016   GFRAA >60 05/14/2016   GFRAA >89 05/06/2016   CALCIUM 9.6 02/05/2019   PROT 6.0 (L) 05/14/2016   BILITOT 0.6 05/14/2016   ALKPHOS 89 05/14/2016   ALT 27 05/14/2016   AST 21 05/14/2016   LIPID: Lab Results  Component Value Date   CHOL 136 10/26/2018   TRIG 76.0 10/26/2018   HDL 37.00 (L) 10/26/2018   LDLCALC 84 10/26/2018    Objective:  BP 100/72 (BP Location: Left Arm, Patient Position: Sitting, Cuff Size: Large)   Pulse (!) 104   Temp 97.9 F (36.6 C) (Temporal)   Ht 5\' 6"  (1.676 m)   Wt 238 lb 4.8 oz (108.1 kg)   SpO2 98%   BMI 38.46 kg/m   Weight: 238 lb 4.8 oz (108.1 kg)   BP Readings from Last 3 Encounters:  02/05/19 100/72  09/24/18 118/84  04/20/18 112/78   Wt Readings from Last 3 Encounters:  02/05/19 238 lb 4.8 oz (108.1 kg)  08/20/18 233 lb (105.7 kg)  04/20/18 233 lb (105.7 kg)    Physical Exam Constitutional:      General: She is not in acute distress.    Appearance: She is well-developed.  HENT:     Head: Normocephalic and atraumatic.     Right Ear: External ear normal.     Left Ear: External ear normal.     Mouth/Throat:     Pharynx: No oropharyngeal exudate.  Eyes:     Conjunctiva/sclera: Conjunctivae normal.     Pupils: Pupils are equal, round, and reactive to light.  Neck:     Musculoskeletal: Normal range of motion and neck supple.     Thyroid: No thyromegaly.  Cardiovascular:     Rate and Rhythm: Normal rate and regular rhythm.     Heart sounds: Normal heart sounds. No murmur. No friction rub. No gallop.   Pulmonary:     Effort: Pulmonary effort is normal.     Breath sounds: Normal breath sounds.  Abdominal:     General: Bowel sounds are normal. There is no distension.     Palpations: Abdomen is soft. There is no mass.     Tenderness: There is no abdominal tenderness. There is no guarding.     Hernia: No hernia is present.  Musculoskeletal: Normal range of motion.        General:  No tenderness or deformity.  Lymphadenopathy:     Cervical: No cervical adenopathy.  Skin:    General: Skin is warm and dry.     Findings: No rash.  Neurological:     Mental Status: She is alert and oriented to person, place, and time.     Deep Tendon Reflexes: Reflexes normal.     Reflex Scores:      Tricep reflexes are 2+ on the right side and 2+ on the left side.      Bicep reflexes are 2+ on the right  side and 2+ on the left side.      Brachioradialis reflexes are 2+ on the right side and 2+ on the left side.      Patellar reflexes are 2+ on the right side and 2+ on the left side. Psychiatric:        Speech: Speech normal.        Behavior: Behavior normal.        Thought Content: Thought content normal.     Assessment/Plan: There are no preventive care reminders to display for this patient. Health Maintenance reviewed - will get flu shot today.  1. Preventative health care Up to date with preventative health care. Following regularly with obgyn.   2. Essential hypertension Well controlled. Continue current medication. - Basic metabolic panel; Future  3. Hyperglycemia - Hemoglobin A1c; Future  4. Obesity without serious comorbidity with body mass index (BMI) in 95th to 98th percentile for age in pediatric patient, unspecified obesity type We discussed working on lower carbohydrate diet. Diabetic diet information given to assist with understanding portion control, carbohydrate content of foods. She is going to continue to work on getting daily exercise.   Return in about 6 months (around 08/06/2019) for Chronic condition visit.  Micheline Rough, MD

## 2019-02-15 ENCOUNTER — Other Ambulatory Visit: Payer: Self-pay

## 2019-02-15 DIAGNOSIS — Z20822 Contact with and (suspected) exposure to covid-19: Secondary | ICD-10-CM

## 2019-02-17 LAB — NOVEL CORONAVIRUS, NAA: SARS-CoV-2, NAA: NOT DETECTED

## 2019-02-24 ENCOUNTER — Encounter: Payer: Self-pay | Admitting: Family Medicine

## 2019-02-24 ENCOUNTER — Telehealth (INDEPENDENT_AMBULATORY_CARE_PROVIDER_SITE_OTHER): Payer: Federal, State, Local not specified - PPO | Admitting: Family Medicine

## 2019-02-24 ENCOUNTER — Other Ambulatory Visit: Payer: Self-pay

## 2019-02-24 DIAGNOSIS — E1165 Type 2 diabetes mellitus with hyperglycemia: Secondary | ICD-10-CM | POA: Diagnosis not present

## 2019-02-24 MED ORDER — BLOOD GLUCOSE MONITOR KIT: PACK | 0 refills | Status: DC

## 2019-02-24 MED ORDER — LISINOPRIL 10 MG PO TABS: 10.0000 mg | ORAL_TABLET | Freq: Every day | ORAL | 1 refills | Status: DC

## 2019-02-24 NOTE — Progress Notes (Signed)
Virtual Visit via Video Note  I connected with Michele [name]  on 02/24/19 at  2:30 PM EDT by a video enabled telemedicine application and verified that I am speaking with the correct person using two identifiers.  Location patient: home Location provider:work or home office Persons participating in the virtual visit: patient, provider  I discussed the limitations of evaluation and management by telemedicine and the availability of in person appointments. The patient expressed understanding and agreed to proceed.   Michele [name] DOB: [birth year] Encounter date: 02/24/2019  This is a 43 y.o. female who presents with Chief Complaint  Patient presents with  . Follow-up    History of present illness: This is scheduled to discuss recent lab work.  Patient had some elevated A1c previously, but not diabetic.  On recent blood work, A1c was at 6.8.  Does state that during Covid they have been doing more eating at home, more eating of sweets as they have been involved with more family time and baking with her daughter.  She had been working on eating healthier and doing some exercise with the Wii in recent months.  No family history of diabetes.  No Known Allergies Current Meds  Medication Sig  . levonorgestrel (MIRENA) 20 MCG/24HR IUD 1 each by Intrauterine route once.  . norethindrone (MICRONOR) 0.35 MG tablet Take 1 tablet by mouth daily.  . [DISCONTINUED] hydrochlorothiazide (HYDRODIURIL) 25 MG tablet Take 1 tablet (25 mg total) by mouth daily.    Review of Systems  Constitutional: Negative for chills, fatigue and fever.  Respiratory: Negative for cough, chest tightness, shortness of breath and wheezing.   Cardiovascular: Negative for chest pain, palpitations and leg swelling.  Genitourinary: Negative for frequency.    Objective:  There were no vitals taken for this visit.      BP Readings from Last 3 Encounters:  02/05/19 100/72  09/24/18 118/84  04/20/18 112/78    Wt Readings from Last 3 Encounters:  02/05/19 238 lb 4.8 oz (108.1 kg)  08/20/18 233 lb (105.7 kg)  04/20/18 233 lb (105.7 kg)    EXAM:  GENERAL: alert, oriented, appears well and in no acute distress  HEENT: atraumatic, conjunctiva clear, no obvious abnormalities on inspection of external nose and ears  NECK: normal movements of the head and neck  LUNGS: on inspection no signs of respiratory distress, breathing rate appears normal, no obvious gross SOB, gasping or wheezing  CV: no obvious cyanosis  MS: moves all visible extremities without noticeable abnormality  PSYCH/NEURO: pleasant and cooperative, no obvious depression or anxiety, speech and thought processing grossly intact   Assessment/Plan  1. Controlled type 2 diabetes mellitus with hyperglycemia, without long-term current use of insulin (HCC) We had extensive discussion today about diagnosis of diabetes.  We discussed risk factors with this and importance of controlling blood pressure as well as blood sugar and cholesterol.  We discussed options for initial treatment including Metformin.  She would like to work on diet and exercise changes first before we add medication.  She was previously on a lisinopril-hydrochlorothiazide combination medication for blood pressure control, blood pressure was getting too low.  Dr. Maudie Mercury had changed her to just hydrochlorothiazide.  We discussed today that we typically like to have an ACE inhibitor on board to help with renal protection, so we will switch her back to the lisinopril alone, which she did tolerate in the past.  We discussed checking intermittent sugars to make sure she is staying in a healthy range.  We discussed carbohydrate counting and working on limiting carbohydrate choices to 3 choices per meal.  I am going to send her some carbohydrates and portion control information to this end.  We discussed option for dietitian referral if interested in the future, but she is going  to take a look at the literature we send over first.  We will do a follow-up in 1 month to follow-up on blood sugars and see how she is tolerating the lisinopril.  We discussed importance of daily exercise.  We additionally discussed statin for preventative purposes with diagnosis of diabetes, we will hold off on starting this and see what her next A1c looks like.  Of note, recent cholesterol was well controlled.   - Hemoglobin A1c; Future - Microalbumin / creatinine urine ratio; Future - Basic metabolic panel; Future - blood glucose meter kit and supplies KIT; Dispense based on patient and insurance preference. Use up to four times daily as directed. (FOR ICD-9 250.00, 250.01).  Dispense: 1 each; Refill: 0  We will recheck A1C in 3 months time. We will do a virtual visit for 1 mo follow up. We will see her back in office in 3 mo time.     I discussed the assessment and treatment plan with the patient. The patient was provided an opportunity to ask questions and all were answered. The patient agreed with the plan and demonstrated an understanding of the instructions.   The patient was advised to call back or seek an in-person evaluation if the symptoms worsen or if the condition fails to improve as anticipated.  I provided 30 minutes of non-face-to-face time during this encounter.   Micheline Rough, MD

## 2019-02-25 ENCOUNTER — Telehealth: Payer: Self-pay | Admitting: *Deleted

## 2019-02-25 MED ORDER — BLOOD GLUCOSE MONITOR KIT: PACK | 0 refills | Status: AC

## 2019-02-25 NOTE — Telephone Encounter (Signed)
-----   Message from Caren Macadam, MD sent at 02/24/2019  3:08 PM EDT ----- Please mail handout; please schedule 1 month virtual follow up; then 3 mo lab visit followed by chronic condition visit.

## 2019-02-25 NOTE — Telephone Encounter (Signed)
I called the pt and scheduled appts as below.  She is aware the diabetic handout was mailed to her home address.  I also printed the Rx for a glucometer kit and faxed this to [facility] at [phone] and the pt is aware.

## 2019-03-13 ENCOUNTER — Other Ambulatory Visit: Payer: Self-pay | Admitting: Family Medicine

## 2019-03-31 ENCOUNTER — Encounter: Payer: Self-pay | Admitting: Family Medicine

## 2019-03-31 ENCOUNTER — Other Ambulatory Visit: Payer: Self-pay

## 2019-03-31 ENCOUNTER — Telehealth (INDEPENDENT_AMBULATORY_CARE_PROVIDER_SITE_OTHER): Payer: Federal, State, Local not specified - PPO | Admitting: Family Medicine

## 2019-03-31 DIAGNOSIS — I10 Essential (primary) hypertension: Secondary | ICD-10-CM

## 2019-03-31 DIAGNOSIS — E119 Type 2 diabetes mellitus without complications: Secondary | ICD-10-CM

## 2019-03-31 NOTE — Progress Notes (Signed)
Virtual Visit via Video Note  I connected with Michele [name] on 04/01/19 at  2:00 PM EST by a video enabled telemedicine application and verified that I am speaking with the correct person using two identifiers.  Location patient: home Location provider:work or home office Persons participating in the virtual visit: patient, provider  I discussed the limitations of evaluation and management by telemedicine and the availability of in person appointments. The patient expressed understanding and agreed to proceed.   Michele [name] DOB: [birth year] Encounter date: 03/31/2019  This is a 43 y.o. female who presents with No chief complaint on file.   History of present illness: Has been trying to be more mindful of what she is eating and how much. Has come to realize how out of shape she is. Trying to do some aerobic type activity - zoomba. Made smaller portions for thanksgiving.   HTN: switched to lisinopril at last visit for renal protection (from hctz alone). Feels like it has been a little bit higher. Has been around 129/90, 127/88, [id number], [id number] systolic. Lowest diastolic was 80. Highest was 98. She has been doing the 10mg  of lisinopril.   DM: new dx. Discussed carb counting and working on lower sugar/carb intake. Sugars have fluctuated some. Has tendency to not eat on a schedule. Not always eating breakfast. Lowest sugar she has was 87. Checked sugar 2 hours after birthday cake and ice cream and next morning fasting was 132. Usually between 120-140.    No Known Allergies No outpatient medications have been marked as taking for the 03/31/19 encounter (Telemedicine) with Caren Macadam, MD.    Review of Systems  Constitutional: Negative for chills, fatigue and fever.  Respiratory: Negative for cough, chest tightness, shortness of breath and wheezing.   Cardiovascular: Negative for chest pain, palpitations and leg swelling.    Objective:  There were no vitals taken for this  visit.      BP Readings from Last 3 Encounters:  02/05/19 100/72  09/24/18 118/84  04/20/18 112/78   Wt Readings from Last 3 Encounters:  02/05/19 238 lb 4.8 oz (108.1 kg)  08/20/18 233 lb (105.7 kg)  04/20/18 233 lb (105.7 kg)    EXAM:  GENERAL: alert, oriented, appears well and in no acute distress  HEENT: atraumatic, conjunctiva clear, no obvious abnormalities on inspection of external nose and ears  NECK: normal movements of the head and neck  LUNGS: on inspection no signs of respiratory distress, breathing rate appears normal, no obvious gross SOB, gasping or wheezing  CV: no obvious cyanosis  MS: moves all visible extremities without noticeable abnormality  PSYCH/NEURO: pleasant and cooperative, no obvious depression or anxiety, speech and thought processing grossly intact   Assessment/Plan  1. Essential hypertension Blood pressure is suboptimally controlled.  We are can increase her lisinopril to 20 mg daily.  She will update me on blood pressures in about 2 weeks time and let me know how this is working for her.  We can change medication dose pending this update.  2. Controlled type 2 diabetes mellitus without complication, without long-term current use of insulin ([place]) She has done a good job reconsidering what she is eating and cooking.  She is planning ahead for meals and eating smaller portions.  She is working on getting regular exercise.  I have encouraged her to keep up the good work, especially through the holiday season.  She already has a follow-up visit set and we can recheck an A1c at that  time.  She is monitoring home sugars and keeping those generally within a normal range.    Return for already scheduled.   I discussed the assessment and treatment plan with the patient. The patient was provided an opportunity to ask questions and all were answered. The patient agreed with the plan and demonstrated an understanding of the instructions.   The patient  was advised to call back or seek an in-person evaluation if the symptoms worsen or if the condition fails to improve as anticipated.  I provided 18 minutes of non-face-to-face time during this encounter.   Micheline Rough, MD

## 2019-04-01 MED ORDER — LISINOPRIL 10 MG PO TABS: 20.0000 mg | ORAL_TABLET | Freq: Every day | ORAL | 1 refills | Status: DC

## 2019-04-27 ENCOUNTER — Encounter: Payer: Self-pay | Admitting: Family Medicine

## 2019-05-03 ENCOUNTER — Other Ambulatory Visit: Payer: Self-pay | Admitting: Family Medicine

## 2019-05-03 MED ORDER — LISINOPRIL-HYDROCHLOROTHIAZIDE 20-12.5 MG PO TABS: 1.0000 | ORAL_TABLET | Freq: Every day | ORAL | 1 refills | Status: DC

## 2019-05-17 ENCOUNTER — Encounter: Payer: Self-pay | Admitting: Family Medicine

## 2019-05-19 ENCOUNTER — Other Ambulatory Visit: Payer: Federal, State, Local not specified - PPO

## 2019-05-19 ENCOUNTER — Ambulatory Visit: Payer: Federal, State, Local not specified - PPO | Attending: Internal Medicine

## 2019-05-19 DIAGNOSIS — Z20822 Contact with and (suspected) exposure to covid-19: Secondary | ICD-10-CM | POA: Diagnosis not present

## 2019-05-19 NOTE — Telephone Encounter (Signed)
I called the pt and rescheduled both appts as below.

## 2019-05-19 NOTE — Telephone Encounter (Signed)
Can you help her reschedule her office visit?  I am not certain from her message if it is just the office visit that needs to be rescheduled or if lab also does.  Labs are already ordered.

## 2019-05-20 LAB — NOVEL CORONAVIRUS, NAA: SARS-CoV-2, NAA: NOT DETECTED

## 2019-05-21 ENCOUNTER — Other Ambulatory Visit: Payer: Federal, State, Local not specified - PPO

## 2019-05-28 ENCOUNTER — Ambulatory Visit: Payer: Federal, State, Local not specified - PPO | Admitting: Family Medicine

## 2019-06-03 ENCOUNTER — Other Ambulatory Visit: Payer: Self-pay

## 2019-06-03 ENCOUNTER — Other Ambulatory Visit (INDEPENDENT_AMBULATORY_CARE_PROVIDER_SITE_OTHER): Payer: Federal, State, Local not specified - PPO

## 2019-06-03 DIAGNOSIS — E1165 Type 2 diabetes mellitus with hyperglycemia: Secondary | ICD-10-CM

## 2019-06-03 LAB — MICROALBUMIN / CREATININE URINE RATIO
Creatinine,U: 87.2 mg/dL
Microalb Creat Ratio: 1 mg/g (ref 0.0–30.0)
Microalb, Ur: 0.9 mg/dL (ref 0.0–1.9)

## 2019-06-03 LAB — HEMOGLOBIN A1C: Hgb A1c MFr Bld: 5.9 % (ref 4.6–6.5)

## 2019-06-03 LAB — BASIC METABOLIC PANEL
BUN: 10 mg/dL (ref 6–23)
CO2: 26 mEq/L (ref 19–32)
Calcium: 9.3 mg/dL (ref 8.4–10.5)
Chloride: 104 mEq/L (ref 96–112)
Creatinine, Ser: 0.76 mg/dL (ref 0.40–1.20)
GFR: 100.87 mL/min (ref >60.00)
Glucose, Bld: 113 mg/dL — ABNORMAL HIGH (ref 70–99)
Potassium: 4 mEq/L (ref 3.5–5.1)
Sodium: 137 mEq/L (ref 135–145)

## 2019-06-09 ENCOUNTER — Other Ambulatory Visit: Payer: Self-pay

## 2019-06-09 ENCOUNTER — Ambulatory Visit (INDEPENDENT_AMBULATORY_CARE_PROVIDER_SITE_OTHER): Payer: Federal, State, Local not specified - PPO

## 2019-06-09 ENCOUNTER — Ambulatory Visit (INDEPENDENT_AMBULATORY_CARE_PROVIDER_SITE_OTHER): Payer: Federal, State, Local not specified - PPO | Admitting: Family Medicine

## 2019-06-09 ENCOUNTER — Encounter: Payer: Self-pay | Admitting: Family Medicine

## 2019-06-09 VITALS — BP 108/80 | HR 78 | Temp 97.6°F | Ht 66.0 in | Wt 232.0 lb

## 2019-06-09 DIAGNOSIS — R739 Hyperglycemia, unspecified: Secondary | ICD-10-CM | POA: Diagnosis not present

## 2019-06-09 DIAGNOSIS — M545 Low back pain, unspecified: Secondary | ICD-10-CM

## 2019-06-09 DIAGNOSIS — I10 Essential (primary) hypertension: Secondary | ICD-10-CM | POA: Diagnosis not present

## 2019-06-09 NOTE — Addendum Note (Signed)
Addended by: Suzette Battiest on: 06/09/2019 02:19 PM   Modules accepted: Orders

## 2019-06-09 NOTE — Patient Instructions (Signed)
Could consider fish oil 3-4 grams daily

## 2019-06-09 NOTE — Progress Notes (Signed)
Michele [name] DOB: 22-Oct-1975 Encounter date: 06/09/2019  This is a 44 y.o. female who presents with Chief Complaint  Patient presents with  . Follow-up    History of present illness: Has been having issues with back. Hurt it a few years ago when seeing Dr. Sherren Mocha. Went to therapy. Started bothering her again in lower back. Mostly bothers her at night; trying to get comfortable. Pain shoots down back when she stands up. Not sure what set it off this time. Has hurt and and off since it bothered her before. Tries to do stretches from therapy. Last xray was 2014 with Dr. Sherren Mocha - at that time some spondylosis and some arthritis. It is harder to get comfortable.    HTN: has been doing a lot better. Usually systolic in the mid 295'A and bottom number in 75-82.   Uncle unexpectedly passed and was tested for COVID related to this. Thought it was heart attack.  DMII: has been more aware of what she is eating and how much. Not exercising as much as she would like, but plans to keep adding more in. A1C decrease from 6.8 down to 5.9.    No Known Allergies Current Meds  Medication Sig  . ACCU-CHEK GUIDE test strip USE AS DIRECTED 4 TIMES DAILY  . blood glucose meter kit and supplies KIT Dispense based on patient and insurance preference. Use up to four times daily as directed. (FOR ICD-9 250.00, 250.01).  Marland Kitchen levonorgestrel (MIRENA) 20 MCG/24HR IUD 1 each by Intrauterine route once.  Marland Kitchen lisinopril-hydrochlorothiazide (ZESTORETIC) 20-12.5 MG tablet Take 1 tablet by mouth daily.  . norethindrone (MICRONOR) 0.35 MG tablet Take 1 tablet by mouth daily.  . [DISCONTINUED] lisinopril (ZESTRIL) 10 MG tablet Take 2 tablets (20 mg total) by mouth daily.    Review of Systems  Constitutional: Negative for chills, fatigue and fever.  Respiratory: Negative for cough, chest tightness, shortness of breath and wheezing.   Cardiovascular: Negative for chest pain, palpitations and leg swelling.     Objective:  BP 108/80 (BP Location: Left Arm, Patient Position: Sitting, Cuff Size: Large)   Pulse 78   Temp 97.6 F (36.4 C) (Temporal)   Ht '5\' 6"'$  (1.676 m)   Wt 232 lb (105.2 kg)   SpO2 98%   BMI 37.45 kg/m   Weight: 232 lb (105.2 kg)   BP Readings from Last 3 Encounters:  06/09/19 108/80  02/05/19 100/72  09/24/18 118/84   Wt Readings from Last 3 Encounters:  06/09/19 232 lb (105.2 kg)  02/05/19 238 lb 4.8 oz (108.1 kg)  08/20/18 233 lb (105.7 kg)    Physical Exam Constitutional:      General: She is not in acute distress.    Appearance: She is well-developed.  HENT:     Head: Normocephalic and atraumatic.  Cardiovascular:     Rate and Rhythm: Normal rate and regular rhythm.     Heart sounds: Normal heart sounds. No murmur.  Pulmonary:     Effort: Pulmonary effort is normal.     Breath sounds: Normal breath sounds.  Abdominal:     General: Bowel sounds are normal. There is no distension.     Palpations: Abdomen is soft.     Tenderness: There is no abdominal tenderness. There is no guarding.  Skin:    General: Skin is warm and dry.     Comments: Sensory exam of the foot is normal, tested with the monofilament. Good pulses, no lesions or ulcers, good peripheral pulses.  Psychiatric:        Judgment: Judgment normal.     Assessment/Plan  1. Lumbar back pain Continue with exercises at home. Xray today and follow up pending results. Consider PT or sports med.  - DG Lumbar Spine Complete; Future  2. Essential hypertension Continue current meds.   3. Hyperglycemia: continue with healthier eating; add regular exercise.    Return in about 3 months (around 09/06/2019) for physical exam (push out until May).  Micheline Rough, MD

## 2019-08-06 ENCOUNTER — Ambulatory Visit: Payer: Federal, State, Local not specified - PPO | Admitting: Family Medicine

## 2019-08-20 ENCOUNTER — Telehealth: Payer: Self-pay | Admitting: Family Medicine

## 2019-08-20 NOTE — Telephone Encounter (Signed)
error 

## 2019-09-02 ENCOUNTER — Other Ambulatory Visit: Payer: Self-pay

## 2019-09-03 ENCOUNTER — Encounter: Payer: Self-pay | Admitting: Family Medicine

## 2019-09-03 ENCOUNTER — Ambulatory Visit (INDEPENDENT_AMBULATORY_CARE_PROVIDER_SITE_OTHER): Payer: Federal, State, Local not specified - PPO | Admitting: Family Medicine

## 2019-09-03 VITALS — BP 130/82 | HR 74 | Temp 97.5°F | Ht 65.75 in | Wt 225.0 lb

## 2019-09-03 DIAGNOSIS — M79671 Pain in right foot: Secondary | ICD-10-CM

## 2019-09-03 DIAGNOSIS — M25512 Pain in left shoulder: Secondary | ICD-10-CM | POA: Diagnosis not present

## 2019-09-03 DIAGNOSIS — E119 Type 2 diabetes mellitus without complications: Secondary | ICD-10-CM

## 2019-09-03 DIAGNOSIS — M5442 Lumbago with sciatica, left side: Secondary | ICD-10-CM

## 2019-09-03 DIAGNOSIS — Z13220 Encounter for screening for lipoid disorders: Secondary | ICD-10-CM

## 2019-09-03 DIAGNOSIS — L309 Dermatitis, unspecified: Secondary | ICD-10-CM | POA: Diagnosis not present

## 2019-09-03 DIAGNOSIS — G8929 Other chronic pain: Secondary | ICD-10-CM

## 2019-09-03 DIAGNOSIS — M79672 Pain in left foot: Secondary | ICD-10-CM

## 2019-09-03 DIAGNOSIS — N920 Excessive and frequent menstruation with regular cycle: Secondary | ICD-10-CM | POA: Diagnosis not present

## 2019-09-03 DIAGNOSIS — D259 Leiomyoma of uterus, unspecified: Secondary | ICD-10-CM

## 2019-09-03 DIAGNOSIS — Z0000 Encounter for general adult medical examination without abnormal findings: Secondary | ICD-10-CM

## 2019-09-03 MED ORDER — BETAMETHASONE DIPROPIONATE 0.05 % EX CREA: TOPICAL_CREAM | Freq: Two times a day (BID) | CUTANEOUS | 0 refills | Status: DC

## 2019-09-03 NOTE — Patient Instructions (Addendum)
Work on Radio broadcast assistant and GETTING MASSAGE :) let me know if this is not improving shoulder pain in next couple of weeks.  Shoulder Range of Motion Exercises Shoulder range of motion (ROM) exercises are done to keep the shoulder moving freely or to increase movement. They are often recommended for people who have shoulder pain or stiffness or who are recovering from a shoulder surgery. Phase 1 exercises When you are able, do this exercise 1-2 times per day for 30-60 seconds in each direction, or as directed by your health care provider. Pendulum exercise To do this exercise while sitting: 1. Sit in a chair or at the edge of your bed with your feet flat on the floor. 2. Let your affected arm hang down in front of you over the edge of the bed or chair. 3. Relax your shoulder, arm, and hand. [place] your body so your arm gently swings in small circles. You can also use your unaffected arm to start the motion. 5. Repeat changing the direction of the circles, swinging your arm left and right, and swinging your arm forward and back. To do this exercise while standing: 1. Stand next to a sturdy chair or table, and hold on to it with your hand on your unaffected side. 2. Bend forward at the waist. 3. Bend your knees slightly. 4. Relax your shoulder, arm, and hand. 5. While keeping your shoulder relaxed, use body motion to swing your arm in small circles. 6. Repeat changing the direction of the circles, swinging your arm left and right, and swinging your arm forward and back. 7. Between exercises, stand up tall and take a short break to relax your lower back.  Phase 2 exercises Do these exercises 1-2 times per day or as told by your health care provider. Hold each stretch for 30 seconds, and repeat 3 times. Do the exercises with one or both arms as instructed by your health care provider. For these exercises, sit at a table with your hand and arm supported by the table. A chair that  slides easily or has wheels can be helpful. External rotation 1. Turn your chair so that your affected side is nearest to the table. 2. Place your forearm on the table to your side. Bend your elbow about 90 at the elbow (right angle) and place your hand palm facing down on the table. Your elbow should be about 6 inches away from your side. 3. Keeping your arm on the table, lean your body forward. Abduction 1. Turn your chair so that your affected side is nearest to the table. 2. Place your forearm and hand on the table so that your thumb points toward the ceiling and your arm is straight out to your side. 3. Slide your hand out to the side and away from you, using your unaffected arm to do the work. 4. To increase the stretch, you can slide your chair away from the table. Flexion: forward stretch 1. Sit facing the table. Place your hand and elbow on the table in front of you. 2. Slide your hand forward and away from you, using your unaffected arm to do the work. 3. To increase the stretch, you can slide your chair backward. Phase 3 exercises Do these exercises 1-2 times per day or as told by your health care provider. Hold each stretch for 30 seconds, and repeat 3 times. Do the exercises with one or both arms as instructed by your health care provider. Cross-body stretch: posterior  capsule stretch 1. Lift your arm straight out in front of you. 2. Bend your arm 90 at the elbow (right angle) so your forearm moves across your body. 3. Use your other arm to gently pull the elbow across your body, toward your other shoulder. Wall climbs 1. Stand with your affected arm extended out to the side with your hand resting on a door frame. 2. Slide your hand slowly up the door frame. 3. To increase the stretch, step through the door frame. Keep your body upright and do not lean. Wand exercises You will need a cane, a piece of PVC pipe, or a sturdy wooden dowel for wand exercises. Flexion To do this  exercise while standing: 1. Hold the wand with both of your hands, palms down. 2. Using the other arm to help, lift your arms up and over your head, if able. 3. Push upward with your other arm to gently increase the stretch. To do this exercise while lying down: 1. Lie on your back with your elbows resting on the floor and the wand in both your hands. Your hands will be palm down, or pointing toward your feet. 2. Lift your hands toward the ceiling, using your unaffected arm to help if needed. 3. Bring your arms overhead as able, using your unaffected arm to help if needed. Internal rotation 1. Stand while holding the wand behind you with both hands. Your unaffected arm should be extended above your head with the arm of the affected side extended behind you at the level of your waist. The wand should be pointing straight up and down as you hold it. 2. Slowly pull the wand up behind your back by straightening the elbow of your unaffected arm and bending the elbow of your affected arm. External rotation 1. Lie on your back with your affected upper arm supported on a small pillow or rolled towel. When you first do this exercise, keep your upper arm close to your body. Over time, bring your arm up to a 90 angle out to the side. 2. Hold the wand across your stomach and with both hands palm up. Your elbow on your affected side should be bent at a 90 angle. 3. Use your unaffected side to help push your forearm away from you and toward the floor. Keep your elbow on your affected side bent at a 90 angle. Contact a health care provider if you have:  New or increasing pain.  New numbness, tingling, weakness, or discoloration in your arm or hand. This information is not intended to replace advice given to you by your health care provider. Make sure you discuss any questions you have with your health care provider. Document Revised: 05/28/2017 Document Reviewed: 05/28/2017 Elsevier Patient Education   2020 [employer].

## 2019-09-03 NOTE — Progress Notes (Signed)
Michele [name] DOB: 07-26-1975 Encounter date: 09/03/2019  This is a 44 y.o. female who presents for complete physical   History of present illness/Additional concerns: Left shoulder has been bothering her - notes more with reaching behind back (like unfastening bra); notes more with reaching to back. Top and back of shoulder. Doesn't remember what triggered it. Thinks has hurt since mid-march. Not had issues with that shoulder before.   Htn: has been stable - reading from today higher than usual.  Dm: has still been working on healthier eating. Is walking on regular basis now as well.   Sees Dr. Cletis Media for gyn care Mammogram: last was 11/2018 Pap: last 11/2018 Doesn't follow with derm. Has spot on left wrist- not getting bigger but sometimes flares up. Not flaring every day; but seems to be aggravated when she gets out of shower. Has been there for a couple of years.   Past Medical History:  Diagnosis Date  . Hypertension    under control with med., has been on med. x 2 yr.  . Hypertrophy of breast 03/2013  . Obesity    Past Surgical History:  Procedure Laterality Date  . BREAST REDUCTION SURGERY Bilateral 04/26/2013   Procedure: BILATERAL BREAST REDUCTION WITH LIPOSUCTION;  Surgeon: Cristine Polio, MD;  Location: [facility];  Service: Plastics;  Laterality: Bilateral;  . CHOLECYSTECTOMY N/A 05/10/2016   Procedure: LAPAROSCOPIC CHOLECYSTECTOMY WITH INTRAOPERATIVE CHOLANGIOGRAM;  Surgeon: Donnie Mesa, MD;  Location: [facility];  Service: General;  Laterality: N/A;  . FOOT SURGERY  2003; 2004   each foot - bunion, hammer toe   No Known Allergies Current Meds  Medication Sig  . blood glucose meter kit and supplies KIT Dispense based on patient and insurance preference. Use up to four times daily as directed. (FOR ICD-9 250.00, 250.01).  Marland Kitchen levonorgestrel (MIRENA) 20 MCG/24HR IUD 1 each by Intrauterine route once.  Marland Kitchen lisinopril-hydrochlorothiazide (ZESTORETIC) 20-12.5 MG  tablet Take 1 tablet by mouth daily.  . norethindrone (MICRONOR) 0.35 MG tablet Take 1 tablet by mouth daily.   Social History   Tobacco Use  . Smoking status: Never Smoker  . Smokeless tobacco: Never Used  Substance Use Topics  . Alcohol use: Yes    Comment: rarely   Family History  Problem Relation Age of Onset  . Cancer Father 38       Prostate  . Heart disease Father   . Migraines Mother   . Arthritis Mother   . Deep vein thrombosis Mother        started after ortho surgery; lifetime  . Cancer Paternal Grandmother        blood?  . Cancer Paternal Grandfather        prostate  . Headache Brother   . Other Brother        ?something with heart; under eval  . Glaucoma Maternal Grandmother   . Head & neck cancer Maternal Grandmother   . Alzheimer's disease Maternal Grandfather   . Heart attack Paternal Uncle 36     Review of Systems  Constitutional: Negative for activity change, appetite change, chills, fatigue, fever and unexpected weight change.  HENT: Negative for congestion, ear pain, hearing loss, sinus pressure, sinus pain, sore throat and trouble swallowing.   Eyes: Negative for pain and visual disturbance.  Respiratory: Negative for cough, chest tightness, shortness of breath and wheezing.   Cardiovascular: Negative for chest pain, palpitations and leg swelling.  Gastrointestinal: Negative for abdominal pain, blood in stool, constipation,  diarrhea, nausea and vomiting.  Genitourinary: Negative for difficulty urinating and menstrual problem.  Musculoskeletal: Positive for arthralgias and neck stiffness. Negative for back pain.  Skin: Negative for rash.  Neurological: Negative for dizziness, weakness, numbness and headaches.  Hematological: Negative for adenopathy. Does not bruise/bleed easily.  Psychiatric/Behavioral: Negative for sleep disturbance and suicidal ideas. The patient is not nervous/anxious.     CBC:  Lab Results  Component Value Date   WBC 8.5  10/26/2018   HGB 12.5 10/26/2018   HCT 37.9 10/26/2018   MCH 27.4 05/14/2016   MCHC 33.0 10/26/2018   RDW 13.6 10/26/2018   PLT 284.0 10/26/2018   CMP: Lab Results  Component Value Date   NA 137 06/03/2019   K 4.0 06/03/2019   CL 104 06/03/2019   CO2 26 06/03/2019   ANIONGAP 9 05/14/2016   GLUCOSE 113 (H) 06/03/2019   BUN 10 06/03/2019   CREATININE 0.76 06/03/2019   CREATININE 0.76 05/06/2016   GFRAA >60 05/14/2016   GFRAA >89 05/06/2016   CALCIUM 9.3 06/03/2019   PROT 6.0 (L) 05/14/2016   BILITOT 0.6 05/14/2016   ALKPHOS 89 05/14/2016   ALT 27 05/14/2016   AST 21 05/14/2016   LIPID: Lab Results  Component Value Date   CHOL 136 10/26/2018   TRIG 76.0 10/26/2018   HDL 37.00 (L) 10/26/2018   LDLCALC 84 10/26/2018    Objective:  BP 130/82 (BP Location: Left Arm, Patient Position: Sitting, Cuff Size: Large)   Pulse 74   Temp (!) 97.5 F (36.4 C) (Temporal)   Ht 5' 5.75" (1.67 m)   Wt 225 lb (102.1 kg)   SpO2 99%   BMI 36.59 kg/m   Weight: 225 lb (102.1 kg)   BP Readings from Last 3 Encounters:  09/03/19 130/82  06/09/19 108/80  02/05/19 100/72   Wt Readings from Last 3 Encounters:  09/03/19 225 lb (102.1 kg)  06/09/19 232 lb (105.2 kg)  02/05/19 238 lb 4.8 oz (108.1 kg)    Physical Exam Constitutional:      General: She is not in acute distress.    Appearance: She is well-developed.  HENT:     Head: Normocephalic and atraumatic.     Right Ear: External ear normal.     Left Ear: External ear normal.     Mouth/Throat:     Pharynx: No oropharyngeal exudate.  Eyes:     Conjunctiva/sclera: Conjunctivae normal.     Pupils: Pupils are equal, round, and reactive to light.  Neck:     Thyroid: No thyromegaly.  Cardiovascular:     Rate and Rhythm: Normal rate and regular rhythm.     Heart sounds: Normal heart sounds. No murmur. No friction rub. No gallop.   Pulmonary:     Effort: Pulmonary effort is normal.     Breath sounds: Normal breath sounds.   Abdominal:     General: Bowel sounds are normal. There is no distension.     Palpations: Abdomen is soft. There is mass.     Tenderness: There is no abdominal tenderness. There is no guarding.     Hernia: No hernia is present.     Comments: Multiple masses palpable in the lower abdomen which patient states are consistent with fibroids.  She is following with gynecology for this.  They are nontender to palpation.  Most prominent fibroid is left side fundus  Musculoskeletal:        General: No tenderness or deformity. Normal range of motion.  Cervical back: Normal range of motion and neck supple.     Comments: She is restricted with left tilt due to muscle spasm trap; but otherwise (and today is good day for shoulder/neck), full ROM and unable to reproduce pain. No rotator cuff weakness in shoulder.   Feet:     Comments: Right foot arch has fallen.  Low arch left foot.  No reproducible pain to palpation of the feet. Lymphadenopathy:     Cervical: No cervical adenopathy.  Skin:    General: Skin is warm and dry.     Findings: No rash.     Comments: Left forearm there is an approximately 3 cm hyperpigmented macule slightly papular on 1 and with slight erythema.  There is some scaling to this lesion.  Neurological:     Mental Status: She is alert and oriented to person, place, and time.     Deep Tendon Reflexes: Reflexes normal.     Reflex Scores:      Tricep reflexes are 2+ on the right side and 2+ on the left side.      Bicep reflexes are 2+ on the right side and 2+ on the left side.      Brachioradialis reflexes are 2+ on the right side and 2+ on the left side.      Patellar reflexes are 2+ on the right side and 2+ on the left side. Psychiatric:        Speech: Speech normal.        Behavior: Behavior normal.        Thought Content: Thought content normal.     Assessment/Plan: There are no preventive care reminders to display for this patient. Health Maintenance reviewed.  1.  Preventative health care She is working on regular exercise and is started walking on a regular basis.  2. Dermatitis This appears to have started with a contact dermatitis but has not healed.  She does have some improvement with moisturizing.  We discussed using higher potency steroid (she has had some improvement with using daughter's triamcinolone) and I have sent in betamethasone for her to use on this area as well as moisturizing.  If she does not have improvement in the lesions in a couple weeks, let me know.  3. Acute pain of left shoulder She is interested in working through some of her joint issues that she wants to be healthier and more active.  I am going to refer to sports medicine for evaluation of the neck and shoulder discomfort.  We discussed exercises for neck and shoulder to help with stretching. - Ambulatory referral to Sports Medicine  4. Menorrhagia with regular cycle She is following regularly with gynecology.  We will check blood counts today. - CBC with Differential/Platelet; Future - TSH; Future  5. Uterine leiomyoma, unspecified location Following with gynecology for this issue.  She does have heavy and prolonged menses.  6. Controlled type 2 diabetes mellitus without complication, without long-term current use of insulin ([place]) She is done a good job of improving diet and working on exercise to help with blood sugar control.  We will plan to recheck for stability. - Hemoglobin A1c; Future - Comprehensive metabolic panel; Future  7. Lipid screening - Lipid panel; Future  8. Pain in both feet - Ambulatory referral to Podiatry  9. Chronic low back pain with left-sided sciatica, unspecified back pain laterality Supports medicine can evaluate low back pain as well. - Ambulatory referral to Sports Medicine  Return for bloodwork in  July; CCV 6 months.  Micheline Rough, MD

## 2019-09-08 NOTE — Progress Notes (Signed)
[facility] [address] [place] Phone: [phone] Subjective:   I Michele [name] am serving as a Education administrator for Dr. Hulan Saas.  This visit occurred during the SARS-CoV-2 public health emergency.  Safety protocols were in place, including screening questions prior to the visit, additional usage of staff PPE, and extensive cleaning of exam room while observing appropriate contact time as indicated for disinfecting solutions.   I'm seeing this patient by the request  of:  Michele [name], Michele Berg, MD  CC: Left shoulder and lower back pain  [id number]  Michele [name] is a 44 y.o. female coming in with complaint of left shoulder and lower back pain. Shoulder feels weak at times. Patient walks and states the pain (back) doesn't bother her with walking. States she feels as if she could pop it her back would feel better.   Onset- shoulder 1 month; back chronic  Location - shoulder joint; left sided back pain Duration- back is uncomfortable at night Character- dull pain  Aggravating factors- taking her bra off (shoulder), Sitting (back), squatting (back)  Reliving factors-  Therapies tried- exercise (shoulder) Heat (back) Severity- 6/10 at its worse (shoulder) 7-8/10 (back)      Past Medical History:  Diagnosis Date  . Hypertension    under control with med., has been on med. x 2 yr.  . Hypertrophy of breast 03/2013  . Obesity    Past Surgical History:  Procedure Laterality Date  . BREAST REDUCTION SURGERY Bilateral 04/26/2013   Procedure: BILATERAL BREAST REDUCTION WITH LIPOSUCTION;  Surgeon: Cristine Polio, MD;  Location: [facility];  Service: Plastics;  Laterality: Bilateral;  . CHOLECYSTECTOMY N/A 05/10/2016   Procedure: LAPAROSCOPIC CHOLECYSTECTOMY WITH INTRAOPERATIVE CHOLANGIOGRAM;  Surgeon: Donnie Mesa, MD;  Location: [facility];  Service: General;  Laterality: N/A;  . FOOT SURGERY  2003; 2004   each foot -  bunion, hammer toe   Social History   Socioeconomic History  . Marital status: Married    Spouse name: Not on file  . Number of children: Not on file  . Years of education: Not on file  . Highest education level: Not on file  Occupational History  . Not on file  Tobacco Use  . Smoking status: Never Smoker  . Smokeless tobacco: Never Used  Substance and Sexual Activity  . Alcohol use: Yes    Comment: rarely  . Drug use: No  . Sexual activity: Yes    Birth control/protection: I.U.D.  Other Topics Concern  . Not on file  Social History Narrative  . Not on file   Social Determinants of Health   Financial Resource Strain:   . Difficulty of Paying Living Expenses:   Food Insecurity:   . Worried About Charity fundraiser in the Last Year:   . Arboriculturist in the Last Year:   Transportation Needs:   . Film/video editor (Medical):   Marland Kitchen Lack of Transportation (Non-Medical):   Physical Activity:   . Days of Exercise per Week:   . Minutes of Exercise per Session:   Stress:   . Feeling of Stress :   Social Connections:   . Frequency of Communication with Friends and Family:   . Frequency of Social Gatherings with Friends and Family:   . Attends Religious Services:   . Active Member of Clubs or Organizations:   . Attends Archivist Meetings:   Marland Kitchen Marital Status:    No Known  Allergies Family History  Problem Relation Age of Onset  . Cancer Father 49       Prostate  . Heart disease Father   . Migraines Mother   . Arthritis Mother   . Deep vein thrombosis Mother        started after ortho surgery; lifetime  . Cancer Paternal Grandmother        blood?  . Cancer Paternal Grandfather        prostate  . Headache Brother   . Other Brother        ?something with heart; under eval  . Glaucoma Maternal Grandmother   . Head & neck cancer Maternal Grandmother   . Alzheimer's disease Maternal Grandfather   . Heart attack Paternal Uncle 47    Current  Outpatient Medications (Endocrine & Metabolic):  .  levonorgestrel (MIRENA) 20 MCG/24HR IUD, 1 each by Intrauterine route once. .  norethindrone (MICRONOR) 0.35 MG tablet, Take 1 tablet by mouth daily.  Current Outpatient Medications (Cardiovascular):  .  lisinopril-hydrochlorothiazide (ZESTORETIC) 20-12.5 MG tablet, Take 1 tablet by mouth daily.     Current Outpatient Medications (Other):  .  betamethasone dipropionate 0.05 % cream, Apply topically 2 (two) times daily. .  blood glucose meter kit and supplies KIT, Dispense based on patient and insurance preference. Use up to four times daily as directed. (FOR ICD-9 250.00, 250.01).   Reviewed prior external information including notes and imaging from  primary care provider As well as notes that were available from care everywhere and other healthcare systems.  Past medical history, social, surgical and family history all reviewed in electronic medical record.  No pertanent information unless stated regarding to the chief complaint.   Review of Systems:  No headache, visual changes, nausea, vomiting, diarrhea, constipation, dizziness, abdominal pain, skin rash, fevers, chills, night sweats, weight loss, swollen lymph nodes, body aches, joint swelling, chest pain, shortness of breath, mood changes. POSITIVE muscle aches  Objective  Blood pressure 128/90, pulse 68, height 5' 5.75" (1.67 m), weight 224 lb (101.6 kg), SpO2 92 %.   General: No apparent distress alert and oriented x3 mood and affect normal, dressed appropriately.  HEENT: Pupils equal, extraocular movements intact  Respiratory: Patient's speak in full sentences and does not appear short of breath  Cardiovascular: No lower extremity edema, non tender, no erythema  Neuro: Cranial nerves II through XII are intact, neurovascularly intact in all extremities with 2+ DTRs and 2+ pulses.  Gait normal with good balance and coordination.  MSK:  tender with full range of motion and  good stability and symmetric strength and tone of  elbows, wrist, hip, knee and ankles bilaterally.  Low back pain noted on exam.  Patient does have poor core strength.  Patient is moderately overweight.  Mild tightness with FABER test but no increasing in discomfort and pain.  Patient does have some tightness with straight leg test.  No radicular symptoms.  5 out of 5 strength in lower extremities.  Left shoulder exam shows the patient does have some mild decrease in internal range of motion of the shoulder.  Patient does have full range of motion otherwise.  5-5 strength of the rotator cuff but tender with empty can.  Patient has minimal impingement with [place].  Limited musculoskeletal ultrasound was performed and interpreted by Lyndal Pulley  Limited ultrasound of patient's left shoulder shows the patient does have what appears to be a subacromial bursitis right near the supraspinatus.  Rotator cuff appears to  be intact significantly.  No significant arthritic changes noted.  Osteopathic findings C2 flexed rotated and side bent right C4 flexed rotated and side bent left C6 flexed rotated and side bent left T3 extended rotated and side bent right inhaled third rib T9 extended rotated and side bent left L2 flexed rotated and side bent right Sacrum right on right    Impression and Recommendations:     This case required medical decision making of moderate complexity. The above documentation has been reviewed and is accurate and complete Lyndal Pulley, DO       Note: This dictation was prepared with Dragon dictation along with smaller phrase technology. Any transcriptional errors that result from this process are unintentional.

## 2019-09-09 ENCOUNTER — Ambulatory Visit: Payer: Federal, State, Local not specified - PPO | Admitting: Family Medicine

## 2019-09-09 ENCOUNTER — Ambulatory Visit: Payer: Self-pay

## 2019-09-09 ENCOUNTER — Encounter: Payer: Self-pay | Admitting: Family Medicine

## 2019-09-09 ENCOUNTER — Other Ambulatory Visit: Payer: Self-pay

## 2019-09-09 VITALS — BP 128/90 | HR 68 | Ht 65.75 in | Wt 224.0 lb

## 2019-09-09 DIAGNOSIS — N920 Excessive and frequent menstruation with regular cycle: Secondary | ICD-10-CM | POA: Diagnosis not present

## 2019-09-09 DIAGNOSIS — M545 Low back pain, unspecified: Secondary | ICD-10-CM

## 2019-09-09 DIAGNOSIS — M999 Biomechanical lesion, unspecified: Secondary | ICD-10-CM

## 2019-09-09 DIAGNOSIS — M7552 Bursitis of left shoulder: Secondary | ICD-10-CM

## 2019-09-09 DIAGNOSIS — G8929 Other chronic pain: Secondary | ICD-10-CM

## 2019-09-09 DIAGNOSIS — D259 Leiomyoma of uterus, unspecified: Secondary | ICD-10-CM | POA: Diagnosis not present

## 2019-09-09 DIAGNOSIS — M25512 Pain in left shoulder: Secondary | ICD-10-CM

## 2019-09-09 NOTE — Assessment & Plan Note (Signed)
Patient back pain seems to be multifactorial.  Patient does have increasing core strength.  No true radicular symptoms.  X-rays do show some mild degenerative disc disease.  Home exercises given, patient responded well to osteopathic manipulation.  Discussed over-the-counter medications.  Patient does have fibroids and that could be contributing to some of the back pain is as well as with a anemia iron deficiency can we discussed supplementations.  Patient will follow up with me again in 4 to 8 weeks otherwise.

## 2019-09-09 NOTE — Assessment & Plan Note (Signed)

## 2019-09-09 NOTE — Patient Instructions (Addendum)
Good to see you Iron 65 mg vitamin C 500 Vitamin D 2,000 IUs daily Exercise 3 times a week See me again in 5-6 weeks

## 2019-09-09 NOTE — Assessment & Plan Note (Signed)
Shoulder bursitis noted.  Topical anti-inflammatories given.  Discussed icing regimen and home exercises, discussed which activities to do which wants to avoid.  Discussed worsening symptoms will consider the possibility of injections.  Follow-up again 4 weeks

## 2019-09-10 ENCOUNTER — Ambulatory Visit (INDEPENDENT_AMBULATORY_CARE_PROVIDER_SITE_OTHER): Payer: Federal, State, Local not specified - PPO

## 2019-09-10 ENCOUNTER — Encounter: Payer: Self-pay | Admitting: Podiatry

## 2019-09-10 ENCOUNTER — Ambulatory Visit: Payer: Federal, State, Local not specified - PPO | Admitting: Podiatry

## 2019-09-10 DIAGNOSIS — M7752 Other enthesopathy of left foot: Secondary | ICD-10-CM

## 2019-09-10 DIAGNOSIS — M76821 Posterior tibial tendinitis, right leg: Secondary | ICD-10-CM

## 2019-09-10 DIAGNOSIS — M779 Enthesopathy, unspecified: Secondary | ICD-10-CM

## 2019-09-10 DIAGNOSIS — M76829 Posterior tibial tendinitis, unspecified leg: Secondary | ICD-10-CM

## 2019-09-13 ENCOUNTER — Other Ambulatory Visit: Payer: Self-pay | Admitting: Podiatry

## 2019-09-13 NOTE — Progress Notes (Signed)
Subjective:   Patient ID: Michele [name], female   DOB: 44 y.o.   MRN: [id number]   HPI Patient states she has had previous surgery on both feet and is developed a lot of pain in her right ankle and knows that she has moderately flatfoot deformity.  Patient here today is with regard going over worsening over the last few months and she is tried shoes with more arch and ice therapy.  Patient does not smoke likes to be active   Review of Systems  All other systems reviewed and are negative.       Objective:  Physical Exam Vitals and nursing note reviewed.  Constitutional:      Appearance: She is well-developed.  Pulmonary:     Effort: Pulmonary effort is normal.  Musculoskeletal:        General: Normal range of motion.  Skin:    General: Skin is warm.  Neurological:     Mental Status: She is alert.     Neurovascular status intact muscle strength found to be adequate range of motion within normal limits.  Patient is found to have inflammation and pain of the posterior tibial tendon right as it comes underneath the medial malleolus and inserts into the navicular.  It is inflamed and sore when pressed and there is moderate depression the arch but I did not note any muscle strength loss of the tendon     Assessment:  Posterior tibial tendinitis right with inflammation fluid     Plan:  H&P condition reviewed and discussed foot structure.  Today I did sterile prep injected the posterior tib as it comes under the medial malleolus 3 mg dexamethasone Kenalog 5 mg Xylocaine and I then went ahead and applied fascial brace to lift up the arch along with foot support.  Discussed possible orthotics and other treatment and reappoint in the next several weeks to reevaluate  X-rays indicate there is moderate depression of the arch no indications of arthritis or coalition type deformity

## 2019-09-15 ENCOUNTER — Other Ambulatory Visit: Payer: Self-pay | Admitting: Podiatry

## 2019-09-15 DIAGNOSIS — M76829 Posterior tibial tendinitis, unspecified leg: Secondary | ICD-10-CM

## 2019-10-11 ENCOUNTER — Ambulatory Visit: Payer: Federal, State, Local not specified - PPO | Admitting: Orthotics

## 2019-10-11 ENCOUNTER — Other Ambulatory Visit: Payer: Self-pay

## 2019-10-11 DIAGNOSIS — M76821 Posterior tibial tendinitis, right leg: Secondary | ICD-10-CM

## 2019-10-11 DIAGNOSIS — M779 Enthesopathy, unspecified: Secondary | ICD-10-CM

## 2019-10-11 NOTE — Progress Notes (Signed)
Patient came in today to pick up custom made foot orthotics.  The goals were accomplished and the patient reported no dissatisfaction with said orthotics.  Patient was advised of breakin period and how to report any issues. 

## 2019-10-14 ENCOUNTER — Ambulatory Visit (INDEPENDENT_AMBULATORY_CARE_PROVIDER_SITE_OTHER): Payer: Federal, State, Local not specified - PPO | Admitting: Family Medicine

## 2019-10-14 ENCOUNTER — Other Ambulatory Visit: Payer: Self-pay

## 2019-10-14 ENCOUNTER — Encounter: Payer: Self-pay | Admitting: Family Medicine

## 2019-10-14 ENCOUNTER — Ambulatory Visit: Payer: Self-pay

## 2019-10-14 VITALS — BP 124/80 | HR 82 | Ht 65.0 in | Wt 223.0 lb

## 2019-10-14 DIAGNOSIS — G8929 Other chronic pain: Secondary | ICD-10-CM

## 2019-10-14 DIAGNOSIS — M7552 Bursitis of left shoulder: Secondary | ICD-10-CM

## 2019-10-14 DIAGNOSIS — M25512 Pain in left shoulder: Secondary | ICD-10-CM

## 2019-10-14 DIAGNOSIS — M999 Biomechanical lesion, unspecified: Secondary | ICD-10-CM | POA: Diagnosis not present

## 2019-10-14 DIAGNOSIS — M545 Low back pain, unspecified: Secondary | ICD-10-CM

## 2019-10-14 NOTE — Assessment & Plan Note (Signed)
Injected today October 14, 2019.  Patient tolerated procedure well.  Had complete movement after and having almost complete resolution of pain.  At optimistic that patient should do relatively well overall.  Discussed home exercises and icing regimen.  Follow-up again in 6 to 8 weeks

## 2019-10-14 NOTE — Assessment & Plan Note (Signed)

## 2019-10-14 NOTE — Progress Notes (Signed)
[place] Phone: [phone] Subjective:   [name], am serving as a scribe for Dr. Hulan Saas. This visit occurred during the SARS-CoV-2 public health emergency.  Safety protocols were in place, including screening questions prior to the visit, additional usage of staff PPE, and extensive cleaning of exam room while observing appropriate contact time as indicated for disinfecting solutions.   I'm seeing this patient by the request  of:  Koberlein, Steele Berg, MD  CC: Shoulder pain follow-up  [id number]   09/09/2019 Shoulder bursitis noted.  Topical anti-inflammatories given.  Discussed icing regimen and home exercises, discussed which activities to do which wants to avoid.  Discussed worsening symptoms will consider the possibility of injections.  Follow-up again 4 weeks  Update 10/14/2019 Michele [name] is a 44 y.o. female coming in with complaint of back pain and left shoulder pain. Last seen on 09/09/2019 for OMT. Patient states that she thought she has some improvement since last visit in the shoulder but continues to have pain. Pain is good on some days and worse on others. Shoulder pain increases with IR and horizontal adduction. Denies any numbness or tingling.     Past Medical History:  Diagnosis Date  . Hypertension    under control with med., has been on med. x 2 yr.  . Hypertrophy of breast 03/2013  . Obesity    Past Surgical History:  Procedure Laterality Date  . BREAST REDUCTION SURGERY Bilateral 04/26/2013   Procedure: BILATERAL BREAST REDUCTION WITH LIPOSUCTION;  Surgeon: Cristine Polio, MD;  Location: [facility];  Service: Plastics;  Laterality: Bilateral;  . CHOLECYSTECTOMY N/A 05/10/2016   Procedure: LAPAROSCOPIC CHOLECYSTECTOMY WITH INTRAOPERATIVE CHOLANGIOGRAM;  Surgeon: Donnie Mesa, MD;  Location: [facility];  Service: General;  Laterality: N/A;  . FOOT SURGERY   2003; 2004   each foot - bunion, hammer toe   Social History   Socioeconomic History  . Marital status: Married    Spouse name: Not on file  . Number of children: Not on file  . Years of education: Not on file  . Highest education level: Not on file  Occupational History  . Not on file  Tobacco Use  . Smoking status: Never Smoker  . Smokeless tobacco: Never Used  Substance and Sexual Activity  . Alcohol use: Yes    Comment: rarely  . Drug use: No  . Sexual activity: Yes    Birth control/protection: I.U.D.  Other Topics Concern  . Not on file  Social History Narrative  . Not on file   Social Determinants of Health   Financial Resource Strain:   . Difficulty of Paying Living Expenses:   Food Insecurity:   . Worried About Charity fundraiser in the Last Year:   . Arboriculturist in the Last Year:   Transportation Needs:   . Film/video editor (Medical):   Marland Kitchen Lack of Transportation (Non-Medical):   Physical Activity:   . Days of Exercise per Week:   . Minutes of Exercise per Session:   Stress:   . Feeling of Stress :   Social Connections:   . Frequency of Communication with Friends and Family:   . Frequency of Social Gatherings with Friends and Family:   . Attends Religious Services:   . Active Member of Clubs or Organizations:   . Attends Archivist Meetings:   Marland Kitchen Marital Status:    No Known  Allergies Family History  Problem Relation Age of Onset  . Cancer Father 32       Prostate  . Heart disease Father   . Migraines Mother   . Arthritis Mother   . Deep vein thrombosis Mother        started after ortho surgery; lifetime  . Cancer Paternal Grandmother        blood?  . Cancer Paternal Grandfather        prostate  . Headache Brother   . Other Brother        ?something with heart; under eval  . Glaucoma Maternal Grandmother   . Head & neck cancer Maternal Grandmother   . Alzheimer's disease Maternal Grandfather   . Heart attack Paternal  Uncle 80    Current Outpatient Medications (Endocrine & Metabolic):  .  levonorgestrel (MIRENA) 20 MCG/24HR IUD, 1 each by Intrauterine route once. .  norethindrone (MICRONOR) 0.35 MG tablet, Take 1 tablet by mouth daily.  Current Outpatient Medications (Cardiovascular):  .  lisinopril-hydrochlorothiazide (ZESTORETIC) 20-12.5 MG tablet, Take 1 tablet by mouth daily.     Current Outpatient Medications (Other):  .  betamethasone dipropionate 0.05 % cream, Apply topically 2 (two) times daily. .  blood glucose meter kit and supplies KIT, Dispense based on patient and insurance preference. Use up to four times daily as directed. (FOR ICD-9 250.00, 250.01).   Reviewed prior external information including notes and imaging from  primary care provider As well as notes that were available from care everywhere and other healthcare systems.  Past medical history, social, surgical and family history all reviewed in electronic medical record.  No pertanent information unless stated regarding to the chief complaint.   Review of Systems:  No headache, visual changes, nausea, vomiting, diarrhea, constipation, dizziness, abdominal pain, skin rash, fevers, chills, night sweats, weight loss, swollen lymph nodes, body aches, joint swelling, chest pain, shortness of breath, mood changes. POSITIVE muscle aches  Objective  Blood pressure 124/80, pulse 82, height '5\' 5"'  (1.651 m), weight 223 lb (101.2 kg), SpO2 98 %.   General: No apparent distress alert and oriented x3 mood and affect normal, dressed appropriately.  HEENT: Pupils equal, extraocular movements intact  Respiratory: Patient's speak in full sentences and does not appear short of breath  Cardiovascular: No lower extremity edema, non tender, no erythema  Neuro: Cranial nerves II through XII are intact, neurovascularly intact in all extremities with 2+ DTRs and 2+ pulses.  Gait normal with good balance and coordination.  MSK: Shoulder:  left Inspection reveals no abnormalities, atrophy or asymmetry. Palpation is normal with no tenderness over AC joint or bicipital groove. ROM is full in all planes passively. Rotator cuff strength normal throughout. signs of impingement with positive Neer and Hawkin's tests, but negative empty can sign. Speeds and Yergason's tests normal. No labral pathology noted with negative Obrien's, negative clunk and good stability. Normal scapular function observed. No painful arc and no drop arm sign. No apprehension sign  Back exam does show that patient does have some mild loss of lordosis.  Tightness in the paraspinal musculature of the entire thoracolumbar juncture.  Patient does have mild tightness with FABER test but negative straight leg test.  Procedure: Real-time Ultrasound Guided Injection of left glenohumeral joint Device: GE Logiq E  Ultrasound guided injection is preferred based studies that show increased duration, increased effect, greater accuracy, decreased procedural pain, increased response rate with ultrasound guided versus blind injection.  Verbal informed consent obtained.  Time-out  conducted.  Noted no overlying erythema, induration, or other signs of local infection.  Skin prepped in a sterile fashion.  Local anesthesia: Topical Ethyl chloride.  With sterile technique and under real time ultrasound guidance:  Joint visualized.  23g 1  inch needle inserted posterior approach. Pictures taken for needle placement. Patient did have injection of 2 cc of 1% lidocaine, 2 cc of 0.5% Marcaine, and 1.0 cc of Kenalog 40 mg/dL. Completed without difficulty  Pain immediately resolved suggesting accurate placement of the medication.  Advised to call if fevers/chills, erythema, induration, drainage, or persistent bleeding.  Images permanently stored and available for review in the ultrasound unit.  Impression: Technically successful ultrasound guided injection.   Osteopathic  findings C2 flexed rotated and side bent right C7 flexed rotated and side bent left T3 extended rotated and side bent right inhaled third rib T7 extended rotated and side bent left L2 flexed rotated and side bent right Sacrum right on right    Impression and Recommendations:     The above documentation has been reviewed and is accurate and complete Lyndal Pulley, DO       Note: This dictation was prepared with Dragon dictation along with smaller phrase technology. Any transcriptional errors that result from this process are unintentional.

## 2019-10-14 NOTE — Assessment & Plan Note (Signed)
Still multifactorial.  Discussed with patient on icing regimen and home exercises.  Discussed the importance of core strengthening control of weight.  Patient is responding extremely well to osteopathic manipulation.  Follow-up again in 6 to 8 weeks

## 2019-10-14 NOTE — Patient Instructions (Addendum)
Injected shoulder today Back and neck are doing great See me in 6-7 weeks

## 2019-10-25 ENCOUNTER — Other Ambulatory Visit: Payer: Self-pay | Admitting: Family Medicine

## 2019-10-28 ENCOUNTER — Other Ambulatory Visit: Payer: Self-pay

## 2019-10-28 ENCOUNTER — Other Ambulatory Visit (INDEPENDENT_AMBULATORY_CARE_PROVIDER_SITE_OTHER): Payer: Federal, State, Local not specified - PPO

## 2019-10-28 DIAGNOSIS — N920 Excessive and frequent menstruation with regular cycle: Secondary | ICD-10-CM | POA: Diagnosis not present

## 2019-10-28 DIAGNOSIS — Z13220 Encounter for screening for lipoid disorders: Secondary | ICD-10-CM | POA: Diagnosis not present

## 2019-10-28 DIAGNOSIS — E119 Type 2 diabetes mellitus without complications: Secondary | ICD-10-CM | POA: Diagnosis not present

## 2019-10-28 LAB — LIPID PANEL
Cholesterol: 164 mg/dL (ref 0–200)
HDL: 47.7 mg/dL (ref >39.00)
LDL Cholesterol: 104 mg/dL — ABNORMAL HIGH (ref 0–99)
NonHDL: 116.18
Total CHOL/HDL Ratio: 3
Triglycerides: 61 mg/dL (ref 0.0–149.0)
VLDL: 12.2 mg/dL (ref 0.0–40.0)

## 2019-10-28 LAB — CBC WITH DIFFERENTIAL/PLATELET
Basophils Absolute: 0 10*3/uL (ref 0.0–0.1)
Basophils Relative: 0.3 % (ref 0.0–3.0)
Eosinophils Absolute: 0.1 10*3/uL (ref 0.0–0.7)
Eosinophils Relative: 1 % (ref 0.0–5.0)
HCT: 36.1 % (ref 36.0–46.0)
Hemoglobin: 11.7 g/dL — ABNORMAL LOW (ref 12.0–15.0)
Lymphocytes Relative: 19 % (ref 12.0–46.0)
Lymphs Abs: 1.6 10*3/uL (ref 0.7–4.0)
MCHC: 32.5 g/dL (ref 30.0–36.0)
MCV: 89.1 fl (ref 78.0–100.0)
Monocytes Absolute: 0.4 10*3/uL (ref 0.1–1.0)
Monocytes Relative: 5.3 % (ref 3.0–12.0)
Neutro Abs: 6.3 10*3/uL (ref 1.4–7.7)
Neutrophils Relative %: 74.4 % (ref 43.0–77.0)
Platelets: 303 10*3/uL (ref 150.0–400.0)
RBC: 4.05 Mil/uL (ref 3.87–5.11)
RDW: 13.7 % (ref 11.5–15.5)
WBC: 8.5 10*3/uL (ref 4.0–10.5)

## 2019-10-28 LAB — COMPREHENSIVE METABOLIC PANEL
ALT: 14 U/L (ref 0–35)
AST: 15 U/L (ref 0–37)
Albumin: 4.2 g/dL (ref 3.5–5.2)
Alkaline Phosphatase: 65 U/L (ref 39–117)
BUN: 22 mg/dL (ref 6–23)
CO2: 27 mEq/L (ref 19–32)
Calcium: 9.4 mg/dL (ref 8.4–10.5)
Chloride: 106 mEq/L (ref 96–112)
Creatinine, Ser: 0.92 mg/dL (ref 0.40–1.20)
GFR: 80.76 mL/min (ref >60.00)
Glucose, Bld: 99 mg/dL (ref 70–99)
Potassium: 4.1 mEq/L (ref 3.5–5.1)
Sodium: 141 mEq/L (ref 135–145)
Total Bilirubin: 0.4 mg/dL (ref 0.2–1.2)
Total Protein: 6.9 g/dL (ref 6.0–8.3)

## 2019-10-28 LAB — TSH: TSH: 1.82 u[IU]/mL (ref 0.35–4.50)

## 2019-10-28 LAB — HEMOGLOBIN A1C: Hgb A1c MFr Bld: 5.5 % (ref 4.6–6.5)

## 2019-11-24 ENCOUNTER — Encounter: Payer: Self-pay | Admitting: Family Medicine

## 2019-11-24 ENCOUNTER — Other Ambulatory Visit: Payer: Self-pay

## 2019-11-24 ENCOUNTER — Ambulatory Visit: Payer: Federal, State, Local not specified - PPO | Admitting: Family Medicine

## 2019-11-24 ENCOUNTER — Ambulatory Visit: Payer: Self-pay

## 2019-11-24 VITALS — BP 110/80 | HR 91 | Ht 65.0 in | Wt 223.0 lb

## 2019-11-24 DIAGNOSIS — M7552 Bursitis of left shoulder: Secondary | ICD-10-CM | POA: Diagnosis not present

## 2019-11-24 DIAGNOSIS — M25561 Pain in right knee: Secondary | ICD-10-CM

## 2019-11-24 DIAGNOSIS — M545 Low back pain, unspecified: Secondary | ICD-10-CM

## 2019-11-24 DIAGNOSIS — M999 Biomechanical lesion, unspecified: Secondary | ICD-10-CM

## 2019-11-24 DIAGNOSIS — M222X1 Patellofemoral disorders, right knee: Secondary | ICD-10-CM | POA: Insufficient documentation

## 2019-11-24 NOTE — Patient Instructions (Signed)
See me in 6-8 weeks 

## 2019-11-24 NOTE — Assessment & Plan Note (Signed)
Patellofemoral syndrome.  Patient does have some mild grinding and lateral tracking.  Discussed with patient that icing regimen, home exercise, which activities to do which wants to avoid.  Worsening symptoms consider injection.  Work with Product/process development scientist to learn home exercises.  Follow-up again in 6 to 8 weeks

## 2019-11-24 NOTE — Progress Notes (Signed)
[place] Phone: [phone] Subjective:   Michele [name], am serving as a scribe for Dr. Hulan Saas. This visit occurred during the SARS-CoV-2 public health emergency.  Safety protocols were in place, including screening questions prior to the visit, additional usage of staff PPE, and extensive cleaning of exam room while observing appropriate contact time as indicated for disinfecting solutions.   I'm seeing this patient by the request  of:  Caren Macadam, MD  CC: Back pain and shoulder pain new onset of knee pain  [id number]   10/14/2019 Injected today October 14, 2019.  Patient tolerated procedure well.  Had complete movement after and having almost complete resolution of pain.  At optimistic that patient should do relatively well overall.  Discussed home exercises and icing regimen.  Follow-up again in 6 to 8 weeks  Still multifactorial.  Discussed with patient on icing regimen and home exercises.  Discussed the importance of core strengthening control of weight.  Patient is responding extremely well to osteopathic manipulation.  Follow-up again in 6 to 8 weeks  Update 11/24/2019 Michele [name] is a 44 y.o. female coming in with complaint of back and shoulder pain. Patient states that her arm pain is improving but not 100%.   Continued pain in lower back.   Also having right knee pain since July 4th. Walks 5 days a week. Also drove to [place] and this caused stiffness, swelling and pain. Pain over anterior and medial aspect. Pain has improved.   Medications patient has been prescribed: Only over-the-counter medicine          Reviewed prior external information including notes and imaging from previsou exam, outside providers and external EMR if available.   As well as notes that were available from care everywhere and other healthcare systems.  Past medical history, social, surgical and family  history all reviewed in electronic medical record.  [name] pertanent information unless stated regarding to the chief complaint.   Past Medical History:  Diagnosis Date  . Hypertension    under control with med., has been on med. x 2 yr.  . Hypertrophy of breast 03/2013  . Obesity     [name] Known Allergies   Review of Systems:  [name] headache, visual changes, nausea, vomiting, diarrhea, constipation, dizziness, abdominal pain, skin rash, fevers, chills, night sweats, weight loss, swollen lymph nodes, body aches, joint swelling, chest pain, shortness of breath, mood changes. POSITIVE muscle aches  Objective  Blood pressure 110/80, pulse 91, height 5\' 5"  (1.651 m), weight (!) 223 lb (101.2 kg), SpO2 99 %.   General: [name] apparent distress alert and oriented x3 mood and affect normal, dressed appropriately.  HEENT: Pupils equal, extraocular movements intact  Respiratory: Patient's speak in full sentences and does not appear short of breath  Cardiovascular: [name] lower extremity edema, non tender, [name] erythema  Neuro: Cranial nerves II through XII are intact, neurovascularly intact in all extremities with 2+ DTRs and 2+ pulses.  Gait normal with good balance and coordination.  MSK: Shoulder unremarkable today with full range of motion very mild impingement noted  Right knee shows lateral tracking of the patella.  Positive patellar grind.  Positive apprehension test.  Full range of motion of the knee, negative McMurray's.  5 out of 5 strength  Back -back exam still has some loss of lordosis.  Poor core strength.  Tightness with FABER test.  Negative straight leg test  Osteopathic findings  C2 flexed rotated and side bent right C7 flexed rotated and side bent left T3 extended rotated and side bent right inhaled rib T7 extended rotated and side bent left L2 flexed rotated and side bent right Sacrum right on right       Assessment and Plan:  Back pain, acute Patient is doing relatively well  overall.  Rate is responding well to osteopathic manipulation.  We will continue to monitor at this time.  Patient will be made [name] significant changes but encouraged any strengthening of the core in the home exercises.  Acute bursitis of left shoulder Nearly resolved at this time we will continue to monitor but should do well.  Worsening symptoms consider injection  Patellofemoral syndrome of right knee Patellofemoral syndrome.  Patient does have some mild grinding and lateral tracking.  Discussed with patient that icing regimen, home exercise, which activities to do which wants to avoid.  Worsening symptoms consider injection.  Work with Product/process development scientist to learn home exercises.  Follow-up again in 6 to 8 weeks    Nonallopathic problems  Decision today to treat with OMT was based on Physical Exam  After verbal consent patient was treated with HVLA, ME, FPR techniques in cervical, rib, thoracic, lumbar, and sacral  areas  Patient tolerated the procedure well with improvement in symptoms  Patient given exercises, stretches and lifestyle modifications  See medications in patient instructions if given  Patient will follow up in 4-8 weeks      The above documentation has been reviewed and is accurate and complete Lyndal Pulley, DO       Note: This dictation was prepared with Dragon dictation along with smaller phrase technology. Any transcriptional errors that result from this process are unintentional.

## 2019-11-24 NOTE — Assessment & Plan Note (Signed)
Nearly resolved at this time we will continue to monitor but should do well.  Worsening symptoms consider injection

## 2019-11-24 NOTE — Assessment & Plan Note (Signed)
Patient is doing relatively well overall.  Rate is responding well to osteopathic manipulation.  We will continue to monitor at this time.  Patient will be made no significant changes but encouraged any strengthening of the core in the home exercises.

## 2019-12-16 DIAGNOSIS — D259 Leiomyoma of uterus, unspecified: Secondary | ICD-10-CM | POA: Diagnosis not present

## 2019-12-16 DIAGNOSIS — N920 Excessive and frequent menstruation with regular cycle: Secondary | ICD-10-CM | POA: Diagnosis not present

## 2019-12-16 DIAGNOSIS — Z1231 Encounter for screening mammogram for malignant neoplasm of breast: Secondary | ICD-10-CM | POA: Diagnosis not present

## 2019-12-29 DIAGNOSIS — D259 Leiomyoma of uterus, unspecified: Secondary | ICD-10-CM | POA: Diagnosis not present

## 2019-12-29 DIAGNOSIS — Z6839 Body mass index (BMI) 39.0-39.9, adult: Secondary | ICD-10-CM | POA: Diagnosis not present

## 2019-12-29 DIAGNOSIS — E559 Vitamin D deficiency, unspecified: Secondary | ICD-10-CM | POA: Diagnosis not present

## 2019-12-29 DIAGNOSIS — Z01419 Encounter for gynecological examination (general) (routine) without abnormal findings: Secondary | ICD-10-CM | POA: Diagnosis not present

## 2019-12-29 DIAGNOSIS — Z1239 Encounter for other screening for malignant neoplasm of breast: Secondary | ICD-10-CM | POA: Diagnosis not present

## 2019-12-29 DIAGNOSIS — Z1231 Encounter for screening mammogram for malignant neoplasm of breast: Secondary | ICD-10-CM | POA: Diagnosis not present

## 2019-12-31 ENCOUNTER — Ambulatory Visit
Admission: RE | Admit: 2019-12-31 | Discharge: 2019-12-31 | Disposition: A | Discharge status: Home / Self Care | Payer: Federal, State, Local not specified - PPO | Source: Ambulatory Visit | Attending: Obstetrics and Gynecology | Admitting: Obstetrics and Gynecology

## 2019-12-31 ENCOUNTER — Other Ambulatory Visit: Payer: Self-pay | Admitting: Obstetrics and Gynecology

## 2019-12-31 DIAGNOSIS — Z30431 Encounter for routine checking of intrauterine contraceptive device: Secondary | ICD-10-CM

## 2019-12-31 DIAGNOSIS — D259 Leiomyoma of uterus, unspecified: Secondary | ICD-10-CM | POA: Diagnosis not present

## 2019-12-31 DIAGNOSIS — Z9049 Acquired absence of other specified parts of digestive tract: Secondary | ICD-10-CM | POA: Diagnosis not present

## 2020-01-05 ENCOUNTER — Encounter: Payer: Self-pay | Admitting: Family Medicine

## 2020-01-05 ENCOUNTER — Ambulatory Visit (INDEPENDENT_AMBULATORY_CARE_PROVIDER_SITE_OTHER): Payer: Federal, State, Local not specified - PPO | Admitting: Family Medicine

## 2020-01-05 ENCOUNTER — Other Ambulatory Visit: Payer: Self-pay

## 2020-01-05 VITALS — BP 122/72 | HR 70 | Ht 65.0 in | Wt 221.0 lb

## 2020-01-05 DIAGNOSIS — M999 Biomechanical lesion, unspecified: Secondary | ICD-10-CM

## 2020-01-05 DIAGNOSIS — M545 Low back pain, unspecified: Secondary | ICD-10-CM

## 2020-01-05 NOTE — Patient Instructions (Signed)
We are making improvement  Keep up the exercises See me again in 6-8 weeks

## 2020-01-05 NOTE — Assessment & Plan Note (Signed)
Chronic at this moment.  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  No true radicular symptoms.  Responding fairly well to just conservative therapy including home exercises, increase activity slowly.  Follow-up with me again in 4 to 8 weeks

## 2020-01-05 NOTE — Progress Notes (Signed)
[place] Phone: [phone] Subjective:   Michele Michele [name], am serving as a scribe for Dr. Hulan Saas. This visit occurred during the SARS-CoV-2 public health emergency.  Safety protocols were in place, including screening questions prior to the visit, additional usage of staff PPE, and extensive cleaning of exam room while observing appropriate contact time as indicated for disinfecting solutions.   I'm seeing this patient by the request  of:  Michele [name], Michele Berg, MD  CC: Back pain follow-up  [id number]  Michele Michele [name] is a 44 y.o. female coming in with complaint of back and neck pain. OMT 11/24/2019. Also seen for right knee pain. Patient states that her knee pain is better. Bracing helps with activity.   Lower back pain increased with barometric pressure changes. States that her pain is over SI joints equally. Is not using any medication for pain relief.   Medications patient has been prescribed: None     Patient has had x-rays previously last one was in February 2020 one of the lumbar spine were independently visualized by me showing moderate to severe degenerative disc disease of L5-S1.    Reviewed prior external information including notes and imaging from previsou exam, outside providers and external EMR if available.   As well as notes that were available from care everywhere and other healthcare systems.  Past medical history, social, surgical and family history all reviewed in electronic medical record.  Michele [name] pertanent information unless stated regarding to the chief complaint.   Past Medical History:  Diagnosis Date  . Hypertension    under control with med., has been on med. x 2 yr.  . Hypertrophy of breast 03/2013  . Obesity     Michele [name] Known Allergies   Review of Systems:  Michele [name] headache, visual changes, nausea, vomiting, diarrhea, constipation, dizziness, abdominal pain, skin rash, fevers,  chills, night sweats, weight loss, swollen lymph nodes, body aches, joint swelling, chest pain, shortness of breath, mood changes. POSITIVE muscle aches  Objective  Blood pressure 122/72, pulse 70, height 5\' 5"  (1.651 m), weight 221 lb (100.2 kg), SpO2 98 %.   General: Michele [name] apparent distress alert and oriented x3 mood and affect normal, dressed appropriately.  HEENT: Pupils equal, extraocular movements intact  Respiratory: Patient's speak in full sentences and does not appear short of breath  Cardiovascular: Michele [name] lower extremity edema, non tender, Michele [name] erythema  Neuro: Cranial nerves II through XII are intact, neurovascularly intact in all extremities with 2+ DTRs and 2+ pulses.  Gait normal with good balance and coordination.  MSK:  Non tender with full range of motion and good stability and symmetric strength and tone of shoulders, elbows, wrist, hip, knee and ankles bilaterally.  Back -low back exam does have some mild tenderness to palpation in the paraspinal musculature, mild pain in the left parascapular region as well noted. Tightness with Corky Sox on the right side negative straight leg test Osteopathic findings   C6 flexed rotated and side bent left T5 extended rotated and side bent left inhaled rib L1 flexed rotated and side bent right Sacrum right on right       Assessment and Plan:  Back pain, acute Chronic at this moment.  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  Michele [name] true radicular symptoms.  Responding fairly well to just conservative therapy including home exercises, increase activity slowly.  Follow-up with me again in 4 to 8 weeks  Nonallopathic problems  Decision today to treat with OMT was based on Physical Exam  After verbal consent patient was treated with HVLA, ME, FPR techniques in cervical, rib, thoracic, lumbar, and sacral  areas  Patient tolerated the procedure well with improvement in symptoms  Patient given exercises, stretches  and lifestyle modifications  See medications in patient instructions if given  Patient will follow up in 4-8 weeks      The above documentation has been reviewed and is accurate and complete Michele Pulley, DO       Note: This dictation was prepared with Dragon dictation along with smaller phrase technology. Any transcriptional errors that result from this process are unintentional.

## 2020-01-13 DIAGNOSIS — D259 Leiomyoma of uterus, unspecified: Secondary | ICD-10-CM | POA: Diagnosis not present

## 2020-01-26 ENCOUNTER — Other Ambulatory Visit: Payer: Self-pay | Admitting: Obstetrics and Gynecology

## 2020-02-22 NOTE — Progress Notes (Signed)
Michele [name] Phone: [phone] Subjective:   [name], am serving as a scribe for Dr. Hulan Saas. This visit occurred during the SARS-CoV-2 public health emergency.  Safety protocols were in place, including screening questions prior to the visit, additional usage of staff PPE, and extensive cleaning of exam room while observing appropriate contact time as indicated for disinfecting solutions.   I'm seeing this patient by the request  of:  Koberlein, Steele Berg, MD  CC: Low back pain follow-up  [id number]  Michele [name] is a 44 y.o. female coming in with complaint of back and neck pain. OMT 01/05/2020. Patient states that she has been having tingling in left shoulder especially at night into middle deltoid.  Patient states that the low back has been more tired recently as well with some intermittent tingling.  Medications patient has been prescribed: Nothing          Reviewed prior external information including notes and imaging from previsou exam, outside providers and external EMR if available.   As well as notes that were available from care everywhere and other healthcare systems.  Past medical history, social, surgical and family history all reviewed in electronic medical record.  No pertanent information unless stated regarding to the chief complaint.   Past Medical History:  Diagnosis Date  . Hypertension    under control with med., has been on med. x 2 yr.  . Hypertrophy of breast 03/2013  . Obesity     No Known Allergies   Review of Systems:  No headache, visual changes, nausea, vomiting, diarrhea, constipation, dizziness, abdominal pain, skin rash, fevers, chills, night sweats, weight loss, swollen lymph nodes, body aches, joint swelling, chest pain, shortness of breath, mood changes. POSITIVE muscle aches  Objective  Blood pressure 110/70, pulse (!) 112, height 5\' 5"  (1.651 m),  weight 222 lb (100.7 kg), SpO2 99 %.   General: No apparent distress alert and oriented x3 mood and affect normal, dressed appropriately.  HEENT: Pupils equal, extraocular movements intact  Respiratory: Patient's speak in full sentences and does not appear short of breath  Cardiovascular: No lower extremity edema, non tender, no erythema  Low back exam shows some loss of lordosis.  Tightness noted in the paraspinal musculature mostly about L5-S1, tightness noted of the right Corky Sox.  Negative straight leg test.    Osteopathic findings  C6 flexed rotated and side bent right  T3 extended rotated and side bent right inhaled rib T8 extended rotated and side bent left L2 flexed rotated and side bent right Sacrum right on right       Assessment and Plan:   Back pain, acute Back pain.  Known degenerative disc disease.  Started on gabapentin.  Low dose.  Patient has responded well to manipulation.  Chronic problem with mild exacerbation.  Follow-up again in 6 to 8 weeks    Nonallopathic problems  Decision today to treat with OMT was based on Physical Exam  After verbal consent patient was treated with HVLA, ME, FPR techniques in cervical, rib, thoracic, lumbar, and sacral  areas  Patient tolerated the procedure well with improvement in symptoms  Patient given exercises, stretches and lifestyle modifications  See medications in patient instructions if given  Patient will follow up in 4-8 weeks      The above documentation has been reviewed and is accurate and complete Lyndal Pulley, DO       Note:  This dictation was prepared with Dragon dictation along with smaller phrase technology. Any transcriptional errors that result from this process are unintentional.

## 2020-02-23 ENCOUNTER — Ambulatory Visit (INDEPENDENT_AMBULATORY_CARE_PROVIDER_SITE_OTHER): Payer: Federal, State, Local not specified - PPO | Admitting: Family Medicine

## 2020-02-23 ENCOUNTER — Other Ambulatory Visit: Payer: Self-pay

## 2020-02-23 ENCOUNTER — Encounter: Payer: Self-pay | Admitting: Family Medicine

## 2020-02-23 VITALS — BP 110/70 | HR 112 | Ht 65.0 in | Wt 222.0 lb

## 2020-02-23 DIAGNOSIS — M545 Low back pain, unspecified: Secondary | ICD-10-CM

## 2020-02-23 DIAGNOSIS — M999 Biomechanical lesion, unspecified: Secondary | ICD-10-CM | POA: Diagnosis not present

## 2020-02-23 MED ORDER — GABAPENTIN 100 MG PO CAPS: 200.0000 mg | ORAL_CAPSULE | Freq: Every day | ORAL | 0 refills | Status: DC

## 2020-02-23 NOTE — Patient Instructions (Signed)
Good luck with trunk or treat Gabapentin 100-200mg  at night See me again in 6-8 weeks

## 2020-02-23 NOTE — Assessment & Plan Note (Signed)
Back pain.  Known degenerative disc disease.  Started on gabapentin.  Low dose.  Patient has responded well to manipulation.  Chronic problem with mild exacerbation.  Follow-up again in 6 to 8 weeks

## 2020-03-01 ENCOUNTER — Other Ambulatory Visit: Payer: Self-pay | Admitting: Obstetrics and Gynecology

## 2020-03-08 ENCOUNTER — Other Ambulatory Visit: Payer: Self-pay

## 2020-03-08 ENCOUNTER — Ambulatory Visit: Payer: Federal, State, Local not specified - PPO | Admitting: Family Medicine

## 2020-03-08 ENCOUNTER — Encounter: Payer: Self-pay | Admitting: Family Medicine

## 2020-03-08 VITALS — BP 110/82 | HR 80 | Temp 98.3°F | Ht 65.0 in | Wt 222.5 lb

## 2020-03-08 DIAGNOSIS — R739 Hyperglycemia, unspecified: Secondary | ICD-10-CM | POA: Diagnosis not present

## 2020-03-08 DIAGNOSIS — Z23 Encounter for immunization: Secondary | ICD-10-CM | POA: Diagnosis not present

## 2020-03-08 DIAGNOSIS — Z13220 Encounter for screening for lipoid disorders: Secondary | ICD-10-CM

## 2020-03-08 DIAGNOSIS — I10 Essential (primary) hypertension: Secondary | ICD-10-CM | POA: Diagnosis not present

## 2020-03-08 DIAGNOSIS — G8929 Other chronic pain: Secondary | ICD-10-CM

## 2020-03-08 DIAGNOSIS — M545 Low back pain, unspecified: Secondary | ICD-10-CM | POA: Diagnosis not present

## 2020-03-08 LAB — POCT GLYCOSYLATED HEMOGLOBIN (HGB A1C): Hemoglobin A1C: 5.4 % (ref 4.0–5.6)

## 2020-03-08 LAB — HM DIABETES EYE EXAM

## 2020-03-08 MED ORDER — VITAMIN D 50 MCG (2000 UT) PO CAPS: 1.0000 | ORAL_CAPSULE | Freq: Every day | ORAL | Status: AC

## 2020-03-08 NOTE — Progress Notes (Signed)
Michele [name] DOB: [birth year] Encounter date: 03/08/2020  This is a 44 y.o. female who presents with Chief Complaint  Patient presents with  . Follow-up    History of present illness: She started on gabapentin recently from sports medicine.  She is following up regularly with them for back pain. She doesn't feel like medicine is working for her. Feels more when going from sitting to standing or at night when lying down. Wakes her; has to adjust self.   Htn: has been stable - reading from today higher than usual.  hyperglycemia: has still been working on healthier eating. Is walking on regular basis now as well.   Was worried about bp for awhile because felt like numbers were going back up. Was put on meds from gyn; and numbers went up for a little while. Getting closer to 196'Q systolic over close to 90.   Sees Dr. Cletis Media for gyn care.  Has hysterectomy scheduled for December.  Fibroid grew and overtook IUD. Bleeding is still an issue.   Mammogram: last was 12/13/2019; I do not have this result but patient states normal. Pap: last 11/2018   No Known Allergies Current Meds  Medication Sig  . betamethasone dipropionate 0.05 % cream Apply topically 2 (two) times daily.  . blood glucose meter kit and supplies KIT Dispense based on patient and insurance preference. Use up to four times daily as directed. (FOR ICD-9 250.00, 250.01).  Marland Kitchen gabapentin (NEURONTIN) 100 MG capsule Take 2 capsules (200 mg total) by mouth at bedtime.  Marland Kitchen levonorgestrel (MIRENA) 20 MCG/24HR IUD 1 each by Intrauterine route once.  Marland Kitchen lisinopril-hydrochlorothiazide (ZESTORETIC) 20-12.5 MG tablet TAKE 1 TABLET BY MOUTH EVERY DAY    Review of Systems  Constitutional: Negative for chills, fatigue and fever.  Respiratory: Negative for cough, chest tightness, shortness of breath and wheezing.   Cardiovascular: Negative for chest pain, palpitations and leg swelling.  Musculoskeletal: Positive for back pain (see hpi).     Objective:  BP 110/82 (BP Location: Left Arm, Patient Position: Sitting, Cuff Size: Large)   Pulse 80   Temp 98.3 F (36.8 C) (Oral)   Ht '5\' 5"'  (1.651 m)   Wt 222 lb 8 oz (100.9 kg)   BMI 37.03 kg/m   Weight: 222 lb 8 oz (100.9 kg)   BP Readings from Last 3 Encounters:  03/08/20 110/82  02/23/20 110/70  01/05/20 122/72   Wt Readings from Last 3 Encounters:  03/08/20 222 lb 8 oz (100.9 kg)  02/23/20 222 lb (100.7 kg)  01/05/20 221 lb (100.2 kg)    Physical Exam Constitutional:      General: She is not in acute distress.    Appearance: She is well-developed.  Cardiovascular:     Rate and Rhythm: Normal rate and regular rhythm.     Heart sounds: Normal heart sounds. No murmur heard.  No friction rub.  Pulmonary:     Effort: Pulmonary effort is normal. No respiratory distress.     Breath sounds: Normal breath sounds. No wheezing or rales.  Musculoskeletal:     Right lower leg: No edema.     Left lower leg: No edema.  Neurological:     Mental Status: She is alert and oriented to person, place, and time.  Psychiatric:        Behavior: Behavior normal.     Assessment/Plan  1. Essential hypertension Well controlled. Continue current zestoretic 20-12.5 daily.  2. Chronic low back pain, unspecified back pain laterality, unspecified whether  sciatica present She is following w sports med. I have advised her to increase her gabapentin to 373m at bedtime x 1 weeks, then up to 4050m She has follow up in December with sports med.  3. Hyperglycemia She has done a great job of diet control of sugars. Keep up good work with this. Work on regular exercise as well to help with sugar control. A1C was 5.4 today. We did discuss increase cardiovasc risk with diabetes, but A1C has been nondiabetic for last year. Continue to monitor and discuss. We discussed consideration for statin for CV prevention and will continue to discuss. Family hx also reviewed today. - POC HgB  A1c   Return in about 6 months (around 09/05/2020) for physical exam; bloodwork prior to physical if desired.    JuMicheline RoughMD

## 2020-03-21 ENCOUNTER — Encounter: Payer: Self-pay | Admitting: Family Medicine

## 2020-04-03 DIAGNOSIS — D219 Benign neoplasm of connective and other soft tissue, unspecified: Secondary | ICD-10-CM | POA: Diagnosis present

## 2020-04-03 NOTE — H&P (Signed)
Michele [name] is a 44 y.o. female, P: 2-0-0-2, presents for hysterectomy because of enlarging uterine fibroids. The patient has know that she had fibroids for many years with incremental increase in size.  Over the past year however, her uterine volume doubled in size compared with the previous year.  A pelvic ultrasound in August 2021 revealed anteverted uterus [1091 cc; 19.3 cm from fundus to external os] : 15.92 x 8.91 x 14.70 cm, endometrium: 6.93 mm with IUD difficult to visualize; > 8 fibroids with largest 5 sub-serosal measured: left fundal-8.78 cm; right fundal-6.19 cm; left mid- 4.76 cm  and 3.79 cm and posterior mid-4.85 cm; right ovary-2.71 cm and left ovary-2.84 cm.  Follow up x-ray imaging confirmed the IUD was within uterine cavity and unchanged from previous films.  She reports that her menstrual flow,  over the past year,  has been off & on every few weeks and requiring the change of a pad every 1.5-4 hours..  There have been occasions that she has soiled her linen but she states that her cramping is minimal (rated 3/10).  She denies changes in bowel or bladder function, dyspareunia, pelvic pain  or vaginitis symptoms.  The patient has been prescribed Norethindrone, the Mirena IUD and Barbette Merino for her bleeding symptoms however her response has been sub-optimal.  During the month of November she has only had spotting however, it has occurred daily.  A review of both medical and surgical management options were given to the patient however the patient, has decided to proceed with definitive therapy in the form of hysterectomy.   Past Medical History  OB History: G: 2;  P: 2-0-0-2;  SVB:2001 and 2008  GYN History: menarche: 44 YO;  LMP: 03/20/2020; Contraception:  Mirena IUD;  Denies history of abnormal PAP smear.   Last PAP smear: 2020-normal PAP smear and negative HPV  Medical History: Essential Hypertension, Fibroids and Diabetes Mellitus (managed with diet)  Surgical History: 2003  Bilateral Bunionectomy 2014 Breast Reduction;2015 Left Breast Biopsy (fat necrosis); 2018 Cholecystectomy. 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; Denies tobacco use and occasionally uses alcohol   Medications: Gabapentin 100 mg prn Lysteda 6050 mg  #2 po tid prn Lisinopril 20 mg/HCTZ 12.5 mg po daily Mirena IUD placed April 2018    ROS: Admits to permanent retainers but denies  corrective lenses, headache, vision changes, nasal congestion, dysphagia, tinnitus, dizziness, hoarseness, cough,  chest pain, shortness of breath, nausea, vomiting, diarrhea,constipation,  urinary frequency, urgency  dysuria, hematuria, vaginitis symptoms, pelvic pain, swelling of joints,easy bruising,  myalgias, arthralgias, skin rashes, unexplained weight loss and except as is mentioned in the history of present illness, patient's review of systems is otherwise negative.    Physical Exam  Bp: 108/70;  P: 81 bpm.; Temperature: 98.7 degrees F orally; Weight: 225 lbs.; Height: 5'5";  BMI: 37.4  Neck: supple without masses or thyromegaly Lungs: clear to auscultation Heart: regular rate and rhythm Abdomen:  firm mass from pelvic to level of umbilicus and no organomegaly Pelvic:EGBUS- wnl; vagina-normal rugae; uterus-20 -22 weeks  size, cervix without lesions or motion tenderness; adnexae-no tenderness or masses Extremities:  no clubbing, cyanosis or edema   Assesment: Enlarging Uterine Fibroids   Disposition:  A discussion was held with patient regarding the indication for her procedure(s) along with the risks, which include but are not limited  to: reaction to anesthesia, damage to adjacent organs, infection and excessive bleeding.  The patient verbalized understanding of these risks and has consented to proceed with  a Total Abdominal Hysterectomy with Bilateral Salpingectomy at [facility] on April 13, 2020 at 7:30 a.m.   CSN# [id number]   Myrick Mcnairy J. Florene Glen, PA-C  for Dr. Dede Query. [place]

## 2020-04-05 ENCOUNTER — Ambulatory Visit (INDEPENDENT_AMBULATORY_CARE_PROVIDER_SITE_OTHER): Payer: Federal, State, Local not specified - PPO | Admitting: Family Medicine

## 2020-04-05 ENCOUNTER — Encounter: Payer: Self-pay | Admitting: Family Medicine

## 2020-04-05 ENCOUNTER — Other Ambulatory Visit: Payer: Self-pay

## 2020-04-05 VITALS — BP 110/74 | HR 92 | Ht 65.0 in | Wt 220.0 lb

## 2020-04-05 DIAGNOSIS — M999 Biomechanical lesion, unspecified: Secondary | ICD-10-CM | POA: Diagnosis not present

## 2020-04-05 DIAGNOSIS — D219 Benign neoplasm of connective and other soft tissue, unspecified: Secondary | ICD-10-CM

## 2020-04-05 DIAGNOSIS — M545 Low back pain, unspecified: Secondary | ICD-10-CM | POA: Diagnosis not present

## 2020-04-05 NOTE — Patient Instructions (Signed)
Good to see you You will do great with surgery See me again in 2 months

## 2020-04-05 NOTE — Assessment & Plan Note (Signed)
Patient back pain seems to be doing better with the gabapentin at this time.  We discussed different treatment options including formal physical therapy.  Patient will be undergoing both surgical hysterectomy for her fibroids.  This could be contributing to some of her back pain as well.  Encourage patient to do this and follow-up with me in 6 to 8 weeks after surgery.  Patient is in agreement with the plan will not change any other medications.

## 2020-04-05 NOTE — Progress Notes (Signed)
[place] Phone: [phone] Subjective:   [name], am serving as a scribe for Dr. Hulan Saas. This visit occurred during the SARS-CoV-2 public health emergency.  Safety protocols were in place, including screening questions prior to the visit, additional usage of staff PPE, and extensive cleaning of exam room while observing appropriate contact time as indicated for disinfecting solutions.   I'm seeing this patient by the request  of:  Koberlein, Steele Berg, MD  CC: Low back pain  [id number]  Michele [name] is a 44 y.o. female coming in with complaint of back and neck pain. OMT 10/272021.  Patient states that her pain is improving.   Medications patient has been prescribed: Gabapentin prn  Taking:         Reviewed prior external information including notes and imaging from previsou exam, outside providers and external EMR if available.   As well as notes that were available from care everywhere and other healthcare systems.  Past medical history, social, surgical and family history all reviewed in electronic medical record.  No pertanent information unless stated regarding to the chief complaint.   Past Medical History:  Diagnosis Date  . Hypertension    under control with med., has been on med. x 2 yr.  . Hypertrophy of breast 03/2013  . Obesity     No Known Allergies   Review of Systems:  No headache, visual changes, nausea, vomiting, diarrhea, constipation, dizziness, abdominal pain, skin rash, fevers, chills, night sweats, weight loss, swollen lymph nodes, body aches, joint swelling, chest pain, shortness of breath, mood changes. POSITIVE muscle aches  Objective  Blood pressure 110/74, pulse 92, height 5\' 5"  (1.651 m), weight 220 lb (99.8 kg), SpO2 99 %.   General: No apparent distress alert and oriented x3 mood and affect normal, dressed appropriately.  HEENT: Pupils equal,  extraocular movements intact  Respiratory: Patient's speak in full sentences and does not appear short of breath  Cardiovascular: No lower extremity edema, non tender, no erythema  Neuro: Cranial nerves II through XII are intact, neurovascularly intact in all extremities with 2+ DTRs and 2+ pulses.  Gait normal with good balance and coordination.  MSK:  Non tender with full range of motion and good stability and symmetric strength and tone of shoulders, elbows, wrist, hip, knee and ankles bilaterally.  Back - N low back exam does have some loss of lordosis.  Some tenderness to palpation of the paraspinal musculature more at the L5-S1 area.  Mild increase in pain with extension of the back.  Tightness noted with FABER test.  Osteopathic findings  C6 flexed rotated and side bent left T3 extended rotated and side bent right inhaled rib L5 flexed rotated and side bent left Sacrum right on right       Assessment and Plan:  Back pain, acute Patient back pain seems to be doing better with the gabapentin at this time.  We discussed different treatment options including formal physical therapy.  Patient will be undergoing both surgical hysterectomy for her fibroids.  This could be contributing to some of her back pain as well.  Encourage patient to do this and follow-up with me in 6 to 8 weeks after surgery.  Patient is in agreement with the plan will not change any other medications.    Nonallopathic problems  Decision today to treat with OMT was based on Physical Exam  After verbal consent patient was treated  with HVLA, ME, FPR techniques in cervical, rib, thoracic, lumbar, and sacral  areas  Patient tolerated the procedure well with improvement in symptoms  Patient given exercises, stretches and lifestyle modifications  See medications in patient instructions if given  Patient will follow up in 4-8 weeks      The above documentation has been reviewed and is accurate and complete  Lyndal Pulley, DO       Note: This dictation was prepared with Dragon dictation along with smaller phrase technology. Any transcriptional errors that result from this process are unintentional.

## 2020-04-06 ENCOUNTER — Other Ambulatory Visit (HOSPITAL_COMMUNITY): Payer: Federal, State, Local not specified - PPO

## 2020-04-07 ENCOUNTER — Other Ambulatory Visit: Payer: Self-pay | Admitting: Obstetrics and Gynecology

## 2020-04-07 ENCOUNTER — Encounter (HOSPITAL_COMMUNITY): Payer: Self-pay

## 2020-04-07 ENCOUNTER — Other Ambulatory Visit: Payer: Self-pay

## 2020-04-07 ENCOUNTER — Encounter (HOSPITAL_COMMUNITY)
Admission: RE | Admit: 2020-04-07 | Discharge: 2020-04-07 | Disposition: A | Discharge status: Home / Self Care | Payer: Federal, State, Local not specified - PPO | Source: Ambulatory Visit | Attending: Obstetrics and Gynecology | Admitting: Obstetrics and Gynecology

## 2020-04-07 DIAGNOSIS — Z01812 Encounter for preprocedural laboratory examination: Secondary | ICD-10-CM | POA: Diagnosis not present

## 2020-04-07 HISTORY — DX: Type 2 diabetes mellitus without complications: E11.9

## 2020-04-07 LAB — BASIC METABOLIC PANEL
Anion gap: 12 (ref 5–15)
BUN: 14 mg/dL (ref 6–20)
CO2: 24 mmol/L (ref 22–32)
Calcium: 9.6 mg/dL (ref 8.9–10.3)
Chloride: 101 mmol/L (ref 98–111)
Creatinine, Ser: 0.86 mg/dL (ref 0.44–1.00)
GFR, Estimated: >60 mL/min (ref >60)
Glucose, Bld: 83 mg/dL (ref 70–99)
Potassium: 3.4 mmol/L — ABNORMAL LOW (ref 3.5–5.1)
Sodium: 137 mmol/L (ref 135–145)

## 2020-04-07 LAB — CBC
HCT: 36.5 % (ref 36.0–46.0)
Hemoglobin: 11.1 g/dL — ABNORMAL LOW (ref 12.0–15.0)
MCH: 27.3 pg (ref 26.0–34.0)
MCHC: 30.4 g/dL (ref 30.0–36.0)
MCV: 89.9 fL (ref 80.0–100.0)
Platelets: 341 10*3/uL (ref 150–400)
RBC: 4.06 MIL/uL (ref 3.87–5.11)
RDW: 13.4 % (ref 11.5–15.5)
WBC: 10.9 10*3/uL — ABNORMAL HIGH (ref 4.0–10.5)
nRBC: 0 % (ref 0.0–0.2)

## 2020-04-07 LAB — GLUCOSE, CAPILLARY: Glucose-Capillary: 75 mg/dL (ref 70–99)

## 2020-04-07 NOTE — Progress Notes (Signed)
Patient has donated (2) units of her own blood via [employer].  Blood bank is aware and has both units available downstairs.  Copies of [employer] donation cards are in the patient's chart.  If patient needs more than (2) units, she is ok to receive it.  T&S is in chart.

## 2020-04-07 NOTE — Progress Notes (Signed)
PCP - Koberlein Cardiologist - denies  Chest x-ray - n/a EKG - 04-07-20  DM -Type 2, diet controlled Fasting Blood Sugar - 95-120  ERAS Protcol - yes, water given   COVID TEST- 04-11-20   Anesthesia review: n/a  Patient denies shortness of breath, fever, cough and chest pain at PAT appointment   All instructions explained to the patient, with a verbal understanding of the material. Patient agrees to go over the instructions while at home for a better understanding. Patient also instructed to self quarantine after being tested for COVID-19. The opportunity to ask questions was provided.

## 2020-04-07 NOTE — Progress Notes (Signed)
[id number] [address], [place] [address] Phone: [phone] Fax: [phone]      Your procedure is scheduled on Thursday, December 16th.  Report to [facility] Main Entrance "A" at 5:30 A.M., and check in at the Admitting office.  Call this number if you have problems the morning of surgery:  [phone]  Call [phone] if you have any questions prior to your surgery date Monday-Friday 8am-4pm    Remember:  Do not eat after midnight the night before your surgery  You may drink clear liquids until 4:30 the morning of your surgery.   Clear liquids allowed are: Water, Non-Citrus Juices (without pulp), Carbonated Beverages, Clear Tea, Black Coffee Only, and Gatorade  Please finish your pre-surgery Ensure by 4:30 AM, the morning of surgery.  Do not sip. Drink all in one sitting.  Nothing else to drink after you finish the Ensure.     Take these medicines the morning of surgery with A SIP OF WATER   Nothing  As of today, STOP taking any Aspirin (unless otherwise instructed by your surgeon) Aleve, Naproxen, Ibuprofen, Motrin, Advil, Goody's, BC's, all herbal medications, fish oil, and all vitamins.                      Do not wear jewelry, make up, or nail polish            Do not wear lotions, powders, perfumes, or deodorant.            Do not shave 48 hours prior to surgery.              Do not bring valuables to the hospital.            [facility] is not responsible for any belongings or valuables.  Do NOT Smoke (Tobacco/Vaping) or drink Alcohol 24 hours prior to your procedure If you use a CPAP at night, you may bring all equipment for your overnight stay.   Contacts, glasses, dentures or bridgework may not be worn into surgery.      For patients admitted to the hospital, discharge time will be determined by your treatment team.   Patients discharged the day of surgery will not be allowed to drive home, and someone  needs to stay with them for 24 hours.    Special instructions:   [place]- Preparing For Surgery  Before surgery, you can play an important role. Because skin is not sterile, your skin needs to be as free of germs as possible. You can reduce the number of germs on your skin by washing with CHG (chlorahexidine gluconate) Soap before surgery.  CHG is an antiseptic cleaner which kills germs and bonds with the skin to continue killing germs even after washing.    Oral Hygiene is also important to reduce your risk of infection.  Remember - BRUSH YOUR TEETH THE MORNING OF SURGERY WITH YOUR REGULAR TOOTHPASTE  Please do not use if you have an allergy to CHG or antibacterial soaps. If your skin becomes reddened/irritated stop using the CHG.  Do not shave (including legs and underarms) for at least 48 hours prior to first CHG shower. It is OK to shave your face.  Please follow these instructions carefully.   1. Shower the NIGHT BEFORE SURGERY and the MORNING OF SURGERY with CHG Soap.   2. If you chose to wash your hair, wash your hair first as usual with your normal  shampoo.  3. After you shampoo, rinse your hair and body thoroughly to remove the shampoo.  4. Use CHG as you would any other liquid soap. You can apply CHG directly to the skin and wash gently with a scrungie or a clean washcloth.   5. Apply the CHG Soap to your body ONLY FROM THE NECK DOWN.  Do not use on open wounds or open sores. Avoid contact with your eyes, ears, mouth and genitals (private parts). Wash Face and genitals (private parts)  with your normal soap.   6. Wash thoroughly, paying special attention to the area where your surgery will be performed.  7. Thoroughly rinse your body with warm water from the neck down.  8. DO NOT shower/wash with your normal soap after using and rinsing off the CHG Soap.  9. Pat yourself dry with a CLEAN TOWEL.  10. Wear CLEAN PAJAMAS to bed the night before surgery  11. Place CLEAN  SHEETS on your bed the night of your first shower and DO NOT SLEEP WITH PETS.   Day of Surgery: Wear Clean/Comfortable clothing the morning of surgery Do not apply any deodorants/lotions.   Remember to brush your teeth WITH YOUR REGULAR TOOTHPASTE.   Please read over the following fact sheets that you were given.

## 2020-04-10 ENCOUNTER — Other Ambulatory Visit: Payer: Self-pay | Admitting: Family Medicine

## 2020-04-10 NOTE — Telephone Encounter (Signed)
Last OV 03/08/20 Last refill 10/25/19 #90/1 Next OV 09/06/20

## 2020-04-11 ENCOUNTER — Other Ambulatory Visit (HOSPITAL_COMMUNITY)
Admission: RE | Admit: 2020-04-11 | Discharge: 2020-04-11 | Disposition: A | Discharge status: Home / Self Care | Payer: Federal, State, Local not specified - PPO | Source: Ambulatory Visit | Attending: Obstetrics and Gynecology | Admitting: Obstetrics and Gynecology

## 2020-04-11 DIAGNOSIS — D259 Leiomyoma of uterus, unspecified: Secondary | ICD-10-CM | POA: Diagnosis not present

## 2020-04-11 DIAGNOSIS — Z20822 Contact with and (suspected) exposure to covid-19: Secondary | ICD-10-CM | POA: Insufficient documentation

## 2020-04-11 DIAGNOSIS — E119 Type 2 diabetes mellitus without complications: Secondary | ICD-10-CM | POA: Diagnosis not present

## 2020-04-11 DIAGNOSIS — I10 Essential (primary) hypertension: Secondary | ICD-10-CM | POA: Diagnosis not present

## 2020-04-11 DIAGNOSIS — Z6837 Body mass index (BMI) 37.0-37.9, adult: Secondary | ICD-10-CM | POA: Diagnosis not present

## 2020-04-11 DIAGNOSIS — Z01812 Encounter for preprocedural laboratory examination: Secondary | ICD-10-CM | POA: Insufficient documentation

## 2020-04-11 DIAGNOSIS — E669 Obesity, unspecified: Secondary | ICD-10-CM | POA: Diagnosis not present

## 2020-04-11 LAB — SARS CORONAVIRUS 2 (TAT 6-24 HRS): SARS Coronavirus 2: NEGATIVE

## 2020-04-12 NOTE — Anesthesia Preprocedure Evaluation (Addendum)
Anesthesia Evaluation  Patient identified by MRN, date of birth, ID band Patient awake    Reviewed: Allergy & Precautions, NPO status , Patient's Chart, lab work & pertinent test results  Airway Mallampati: II  TM Distance: >3 FB Neck ROM: Full    Dental  (+) Teeth Intact, Dental Advisory Given   Pulmonary neg pulmonary ROS,    Pulmonary exam normal breath sounds clear to auscultation       Cardiovascular hypertension, Pt. on medications Normal cardiovascular exam Rhythm:Regular Rate:Normal     Neuro/Psych negative neurological ROS  negative psych ROS   GI/Hepatic negative GI ROS, Neg liver ROS,   Endo/Other  diabetes, Well Controlled, Type 2Obesity   Renal/GU negative Renal ROS     Musculoskeletal negative musculoskeletal ROS (+)   Abdominal   Peds  Hematology  (+) Blood dyscrasia, anemia ,   Anesthesia Other Findings   Reproductive/Obstetrics FIBROIDS  DYSFUNCTIONAL UTERINE BLEEDING                           Anesthesia Physical Anesthesia Plan  ASA: II  Anesthesia Plan: General   Post-op Pain Management:  Regional for Post-op pain   Induction: Intravenous  PONV Risk Score and Plan: 4 or greater and Scopolamine patch - Pre-op, Midazolam, Dexamethasone and Ondansetron  Airway Management Planned: Oral ETT  Additional Equipment:   Intra-op Plan:   Post-operative Plan: Extubation in OR  Informed Consent: I have reviewed the patients History and Physical, chart, labs and discussed the procedure including the risks, benefits and alternatives for the proposed anesthesia with the patient or authorized representative who has indicated his/her understanding and acceptance.       Plan Discussed with: CRNA  Anesthesia Plan Comments: (Bilateral TAP Blocks 2nd PIV)       Anesthesia Quick Evaluation

## 2020-04-13 ENCOUNTER — Encounter (HOSPITAL_COMMUNITY): Payer: Self-pay | Admitting: Obstetrics and Gynecology

## 2020-04-13 ENCOUNTER — Other Ambulatory Visit: Payer: Self-pay

## 2020-04-13 ENCOUNTER — Inpatient Hospital Stay (HOSPITAL_COMMUNITY)
Admission: RE | Admit: 2020-04-13 | Discharge: 2020-04-15 | DRG: 743 | Disposition: A | Discharge status: Home / Self Care | Payer: Federal, State, Local not specified - PPO | Attending: Obstetrics and Gynecology | Admitting: Obstetrics and Gynecology

## 2020-04-13 ENCOUNTER — Encounter (HOSPITAL_COMMUNITY)
Admission: RE | Disposition: A | Discharge status: Home / Self Care | Payer: Self-pay | Source: Home / Self Care | Attending: Obstetrics and Gynecology

## 2020-04-13 ENCOUNTER — Inpatient Hospital Stay (HOSPITAL_COMMUNITY): Payer: Federal, State, Local not specified - PPO | Admitting: Certified Registered Nurse Anesthetist

## 2020-04-13 DIAGNOSIS — I10 Essential (primary) hypertension: Secondary | ICD-10-CM | POA: Diagnosis present

## 2020-04-13 DIAGNOSIS — G8918 Other acute postprocedural pain: Secondary | ICD-10-CM | POA: Diagnosis not present

## 2020-04-13 DIAGNOSIS — D259 Leiomyoma of uterus, unspecified: Secondary | ICD-10-CM | POA: Diagnosis not present

## 2020-04-13 DIAGNOSIS — D252 Subserosal leiomyoma of uterus: Secondary | ICD-10-CM | POA: Diagnosis not present

## 2020-04-13 DIAGNOSIS — Z6837 Body mass index (BMI) 37.0-37.9, adult: Secondary | ICD-10-CM | POA: Diagnosis not present

## 2020-04-13 DIAGNOSIS — E669 Obesity, unspecified: Secondary | ICD-10-CM | POA: Diagnosis present

## 2020-04-13 DIAGNOSIS — N870 Mild cervical dysplasia: Secondary | ICD-10-CM | POA: Diagnosis not present

## 2020-04-13 DIAGNOSIS — D251 Intramural leiomyoma of uterus: Secondary | ICD-10-CM | POA: Diagnosis not present

## 2020-04-13 DIAGNOSIS — E119 Type 2 diabetes mellitus without complications: Secondary | ICD-10-CM | POA: Diagnosis not present

## 2020-04-13 DIAGNOSIS — Z20822 Contact with and (suspected) exposure to covid-19: Secondary | ICD-10-CM | POA: Diagnosis present

## 2020-04-13 DIAGNOSIS — D219 Benign neoplasm of connective and other soft tissue, unspecified: Secondary | ICD-10-CM | POA: Diagnosis present

## 2020-04-13 DIAGNOSIS — D649 Anemia, unspecified: Secondary | ICD-10-CM | POA: Diagnosis not present

## 2020-04-13 DIAGNOSIS — D250 Submucous leiomyoma of uterus: Secondary | ICD-10-CM | POA: Diagnosis not present

## 2020-04-13 HISTORY — PX: BILATERAL SALPINGECTOMY: SHX5743

## 2020-04-13 HISTORY — PX: ABDOMINAL HYSTERECTOMY: SHX81

## 2020-04-13 LAB — CBC
HCT: 40.3 % (ref 36.0–46.0)
Hemoglobin: 12.6 g/dL (ref 12.0–15.0)
MCH: 27.2 pg (ref 26.0–34.0)
MCHC: 31.3 g/dL (ref 30.0–36.0)
MCV: 87 fL (ref 80.0–100.0)
Platelets: 252 10*3/uL (ref 150–400)
RBC: 4.63 MIL/uL (ref 3.87–5.11)
RDW: 13.7 % (ref 11.5–15.5)
WBC: 20.6 10*3/uL — ABNORMAL HIGH (ref 4.0–10.5)
nRBC: 0 % (ref 0.0–0.2)

## 2020-04-13 LAB — GLUCOSE, CAPILLARY
Glucose-Capillary: 85 mg/dL (ref 70–99)
Glucose-Capillary: 146 mg/dL — ABNORMAL HIGH (ref 70–99)

## 2020-04-13 LAB — ABO/RH: ABO/RH(D): A POS

## 2020-04-13 LAB — POCT PREGNANCY, URINE: Preg Test, Ur: NEGATIVE

## 2020-04-13 LAB — CREATININE, SERUM
Creatinine, Ser: 0.72 mg/dL (ref 0.44–1.00)
GFR, Estimated: >60 mL/min (ref >60)

## 2020-04-13 SURGERY — HYSTERECTOMY, ABDOMINAL
Anesthesia: General | Site: Abdomen

## 2020-04-13 MED ORDER — POVIDONE-IODINE 10 % EX SWAB: 2.0000 "application " | Freq: Once | CUTANEOUS | Status: DC

## 2020-04-13 MED ORDER — LACTATED RINGERS IV SOLN: INTRAVENOUS | Status: DC

## 2020-04-13 MED ORDER — ENOXAPARIN SODIUM 60 MG/0.6ML ~~LOC~~ SOLN
0.5000 mg/kg | SUBCUTANEOUS | Status: DC
  Administered 2020-04-14 – 2020-04-15 (×2): 50 mg via SUBCUTANEOUS
  Filled 2020-04-13 (×2): qty 0.6

## 2020-04-13 MED ORDER — ONDANSETRON HCL 4 MG/2ML IJ SOLN: 4.0000 mg | Freq: Four times a day (QID) | INTRAMUSCULAR | Status: DC | PRN

## 2020-04-13 MED ORDER — SODIUM CHLORIDE 0.9 % IR SOLN
Status: DC | PRN
  Administered 2020-04-13: 2000 mL

## 2020-04-13 MED ORDER — SODIUM CHLORIDE 0.9 % IV SOLN: INTRAVENOUS | Status: DC | PRN

## 2020-04-13 MED ORDER — PROPOFOL 10 MG/ML IV BOLUS
INTRAVENOUS | Status: DC | PRN
  Administered 2020-04-13: 170 mg via INTRAVENOUS

## 2020-04-13 MED ORDER — DEXAMETHASONE SODIUM PHOSPHATE 10 MG/ML IJ SOLN
INTRAMUSCULAR | Status: AC
  Filled 2020-04-13: qty 1

## 2020-04-13 MED ORDER — HYDROCHLOROTHIAZIDE 12.5 MG PO CAPS
12.5000 mg | ORAL_CAPSULE | Freq: Every day | ORAL | Status: DC
  Administered 2020-04-13 – 2020-04-15 (×3): 12.5 mg via ORAL
  Filled 2020-04-13 (×3): qty 1

## 2020-04-13 MED ORDER — BUPIVACAINE HCL (PF) 0.25 % IJ SOLN
INTRAMUSCULAR | Status: AC
  Filled 2020-04-13: qty 30

## 2020-04-13 MED ORDER — CEFAZOLIN SODIUM-DEXTROSE 2-4 GM/100ML-% IV SOLN
2.0000 g | INTRAVENOUS | Status: AC
  Administered 2020-04-13: 2 g via INTRAVENOUS
  Filled 2020-04-13: qty 100

## 2020-04-13 MED ORDER — MIDAZOLAM HCL 5 MG/5ML IJ SOLN
INTRAMUSCULAR | Status: DC | PRN
  Administered 2020-04-13: 2 mg via INTRAVENOUS

## 2020-04-13 MED ORDER — LIDOCAINE 2% (20 MG/ML) 5 ML SYRINGE
INTRAMUSCULAR | Status: DC | PRN
  Administered 2020-04-13: 80 mg via INTRAVENOUS

## 2020-04-13 MED ORDER — SCOPOLAMINE 1 MG/3DAYS TD PT72
1.0000 | MEDICATED_PATCH | TRANSDERMAL | Status: DC
  Administered 2020-04-13: 1.5 mg via TRANSDERMAL
  Filled 2020-04-13: qty 1

## 2020-04-13 MED ORDER — FENTANYL CITRATE (PF) 250 MCG/5ML IJ SOLN
INTRAMUSCULAR | Status: AC
  Filled 2020-04-13: qty 5

## 2020-04-13 MED ORDER — SIMETHICONE 80 MG PO CHEW
80.0000 mg | CHEWABLE_TABLET | Freq: Four times a day (QID) | ORAL | Status: DC | PRN
  Administered 2020-04-14: 80 mg via ORAL
  Filled 2020-04-13: qty 1

## 2020-04-13 MED ORDER — DOCUSATE SODIUM 100 MG PO CAPS
100.0000 mg | ORAL_CAPSULE | Freq: Two times a day (BID) | ORAL | Status: DC
  Administered 2020-04-13 – 2020-04-15 (×5): 100 mg via ORAL
  Filled 2020-04-13 (×5): qty 1

## 2020-04-13 MED ORDER — DEXAMETHASONE SODIUM PHOSPHATE 10 MG/ML IJ SOLN
INTRAMUSCULAR | Status: DC | PRN
Start: 2020-04-13 — End: 2020-04-13
  Administered 2020-04-13 (×2): 5 mg

## 2020-04-13 MED ORDER — ORAL CARE MOUTH RINSE: 15.0000 mL | Freq: Once | OROMUCOSAL | Status: AC

## 2020-04-13 MED ORDER — LISINOPRIL 10 MG PO TABS
20.0000 mg | ORAL_TABLET | Freq: Every day | ORAL | Status: DC
  Administered 2020-04-13 – 2020-04-15 (×3): 20 mg via ORAL
  Filled 2020-04-13 (×3): qty 2

## 2020-04-13 MED ORDER — PROPOFOL 10 MG/ML IV BOLUS
INTRAVENOUS | Status: AC
  Filled 2020-04-13: qty 40

## 2020-04-13 MED ORDER — ENSURE PRE-SURGERY PO LIQD: 296.0000 mL | Freq: Once | ORAL | Status: DC

## 2020-04-13 MED ORDER — MIDAZOLAM HCL 2 MG/2ML IJ SOLN
INTRAMUSCULAR | Status: AC
  Filled 2020-04-13: qty 2

## 2020-04-13 MED ORDER — FENTANYL CITRATE (PF) 250 MCG/5ML IJ SOLN
INTRAMUSCULAR | Status: DC | PRN
  Administered 2020-04-13 (×2): 50 ug via INTRAVENOUS
  Administered 2020-04-13: 100 ug via INTRAVENOUS
  Administered 2020-04-13 (×4): 50 ug via INTRAVENOUS

## 2020-04-13 MED ORDER — ACETAMINOPHEN 500 MG PO TABS
1000.0000 mg | ORAL_TABLET | ORAL | Status: AC
  Administered 2020-04-13: 1000 mg via ORAL
  Filled 2020-04-13: qty 2

## 2020-04-13 MED ORDER — ONDANSETRON HCL 4 MG/2ML IJ SOLN
INTRAMUSCULAR | Status: AC
  Filled 2020-04-13: qty 2

## 2020-04-13 MED ORDER — ACETAMINOPHEN 500 MG PO TABS
1000.0000 mg | ORAL_TABLET | Freq: Four times a day (QID) | ORAL | Status: DC
  Administered 2020-04-13 – 2020-04-15 (×8): 1000 mg via ORAL
  Filled 2020-04-13 (×8): qty 2

## 2020-04-13 MED ORDER — ENSURE PRE-SURGERY PO LIQD: 592.0000 mL | Freq: Once | ORAL | Status: DC

## 2020-04-13 MED ORDER — PROMETHAZINE HCL 25 MG/ML IJ SOLN: 6.2500 mg | INTRAMUSCULAR | Status: DC | PRN

## 2020-04-13 MED ORDER — ONDANSETRON HCL 4 MG/2ML IJ SOLN
INTRAMUSCULAR | Status: DC | PRN
  Administered 2020-04-13: 4 mg via INTRAVENOUS

## 2020-04-13 MED ORDER — LACTATED RINGERS IV SOLN: INTRAVENOUS | Status: DC | PRN

## 2020-04-13 MED ORDER — MENTHOL 3 MG MT LOZG: 1.0000 | LOZENGE | OROMUCOSAL | Status: DC | PRN

## 2020-04-13 MED ORDER — KETOROLAC TROMETHAMINE 30 MG/ML IJ SOLN
30.0000 mg | Freq: Four times a day (QID) | INTRAMUSCULAR | Status: AC
  Administered 2020-04-13 – 2020-04-14 (×4): 30 mg via INTRAVENOUS
  Filled 2020-04-13 (×4): qty 1

## 2020-04-13 MED ORDER — LISINOPRIL-HYDROCHLOROTHIAZIDE 20-12.5 MG PO TABS
1.0000 | ORAL_TABLET | Freq: Every day | ORAL | Status: DC
Start: 2020-04-14 — End: 2020-04-13

## 2020-04-13 MED ORDER — PHENYLEPHRINE 40 MCG/ML (10ML) SYRINGE FOR IV PUSH (FOR BLOOD PRESSURE SUPPORT)
PREFILLED_SYRINGE | INTRAVENOUS | Status: DC | PRN
  Administered 2020-04-13: 80 ug via INTRAVENOUS

## 2020-04-13 MED ORDER — BACITRACIN-NEOMYCIN-POLYMYXIN OINTMENT TUBE
TOPICAL_OINTMENT | CUTANEOUS | Status: AC
  Filled 2020-04-13: qty 14.17

## 2020-04-13 MED ORDER — ONDANSETRON HCL 4 MG PO TABS: 4.0000 mg | ORAL_TABLET | Freq: Four times a day (QID) | ORAL | Status: DC | PRN

## 2020-04-13 MED ORDER — GABAPENTIN 300 MG PO CAPS
300.0000 mg | ORAL_CAPSULE | ORAL | Status: AC
  Administered 2020-04-13: 300 mg via ORAL
  Filled 2020-04-13: qty 1

## 2020-04-13 MED ORDER — ROCURONIUM BROMIDE 10 MG/ML (PF) SYRINGE
PREFILLED_SYRINGE | INTRAVENOUS | Status: DC | PRN
  Administered 2020-04-13: 70 mg via INTRAVENOUS
  Administered 2020-04-13: 20 mg via INTRAVENOUS

## 2020-04-13 MED ORDER — FENTANYL CITRATE (PF) 100 MCG/2ML IJ SOLN: 25.0000 ug | INTRAMUSCULAR | Status: DC | PRN

## 2020-04-13 MED ORDER — GABAPENTIN 300 MG PO CAPS
300.0000 mg | ORAL_CAPSULE | Freq: Every day | ORAL | Status: DC
  Administered 2020-04-13 – 2020-04-14 (×2): 300 mg via ORAL
  Filled 2020-04-13 (×2): qty 1

## 2020-04-13 MED ORDER — CHLORHEXIDINE GLUCONATE 0.12 % MT SOLN
15.0000 mL | Freq: Once | OROMUCOSAL | Status: AC
  Administered 2020-04-13: 15 mL via OROMUCOSAL
  Filled 2020-04-13: qty 15

## 2020-04-13 MED ORDER — SUGAMMADEX SODIUM 200 MG/2ML IV SOLN
INTRAVENOUS | Status: DC | PRN
  Administered 2020-04-13: 200 mg via INTRAVENOUS

## 2020-04-13 MED ORDER — BUPIVACAINE-EPINEPHRINE (PF) 0.25% -1:200000 IJ SOLN
INTRAMUSCULAR | Status: DC | PRN
  Administered 2020-04-13 (×2): 30 mL via PERINEURAL

## 2020-04-13 MED ORDER — DEXAMETHASONE SODIUM PHOSPHATE 10 MG/ML IJ SOLN
INTRAMUSCULAR | Status: DC | PRN
  Administered 2020-04-13: 5 mg via INTRAVENOUS

## 2020-04-13 MED ORDER — OXYCODONE HCL 5 MG PO TABS: 5.0000 mg | ORAL_TABLET | Freq: Four times a day (QID) | ORAL | Status: DC | PRN

## 2020-04-13 MED ORDER — CELECOXIB 200 MG PO CAPS
400.0000 mg | ORAL_CAPSULE | ORAL | Status: AC
  Administered 2020-04-13: 400 mg via ORAL
  Filled 2020-04-13: qty 2

## 2020-04-13 MED ORDER — HYDROMORPHONE HCL 1 MG/ML IJ SOLN: 1.0000 mg | INTRAMUSCULAR | Status: DC | PRN

## 2020-04-13 SURGICAL SUPPLY — 49 items
APL SKNCLS STERI-STRIP NONHPOA (GAUZE/BANDAGES/DRESSINGS) ×3
BENZOIN TINCTURE PRP APPL 2/3 (GAUZE/BANDAGES/DRESSINGS) ×5 IMPLANT
CANISTER SUCT 3000ML PPV (MISCELLANEOUS) ×5 IMPLANT
CELLS DAT CNTRL 66122 CELL SVR (MISCELLANEOUS) IMPLANT
CLOSURE WOUND 1/2 X4 (GAUZE/BANDAGES/DRESSINGS) ×1
COVER WAND RF STERILE (DRAPES) ×5 IMPLANT
DECANTER SPIKE VIAL GLASS SM (MISCELLANEOUS) IMPLANT
DRAPE WARM FLUID 44X44 (DRAPES) ×5 IMPLANT
DRSG OPSITE POSTOP 4X10 (GAUZE/BANDAGES/DRESSINGS) ×5 IMPLANT
DURAPREP 26ML APPLICATOR (WOUND CARE) ×5 IMPLANT
ELECT BLADE 4.0 EZ CLEAN MEGAD (MISCELLANEOUS) ×5
ELECTRODE BLDE 4.0 EZ CLN MEGD (MISCELLANEOUS) IMPLANT
GAUZE 4X4 16PLY RFD (DISPOSABLE) ×4 IMPLANT
GLOVE BIOGEL PI IND STRL 7.0 (GLOVE) ×12 IMPLANT
GLOVE BIOGEL PI INDICATOR 7.0 (GLOVE) ×8
GOWN STRL REUS W/ TWL LRG LVL3 (GOWN DISPOSABLE) ×9 IMPLANT
GOWN STRL REUS W/TWL LRG LVL3 (GOWN DISPOSABLE) ×15
HIBICLENS CHG 4% 4OZ BTL (MISCELLANEOUS) ×5 IMPLANT
KIT TURNOVER KIT B (KITS) ×5 IMPLANT
NEEDLE HYPO 22GX1.5 SAFETY (NEEDLE) ×5 IMPLANT
NS IRRIG 1000ML POUR BTL (IV SOLUTION) ×5 IMPLANT
PACK ABDOMINAL GYN (CUSTOM PROCEDURE TRAY) ×5 IMPLANT
PAD ARMBOARD 7.5X6 YLW CONV (MISCELLANEOUS) ×5 IMPLANT
PAD OB MATERNITY 4.3X12.25 (PERSONAL CARE ITEMS) ×5 IMPLANT
RETRACTOR WND ALEXIS 18 MED (MISCELLANEOUS) IMPLANT
RETRACTOR WND ALEXIS 25 LRG (MISCELLANEOUS) IMPLANT
RETRACTOR WOUND ALXS 34CM XLRG (MISCELLANEOUS) IMPLANT
RTRCTR WOUND ALEXIS 18CM MED (MISCELLANEOUS)
RTRCTR WOUND ALEXIS 25CM LRG (MISCELLANEOUS)
RTRCTR WOUND ALEXIS 34CM XLRG (MISCELLANEOUS) ×5
SPONGE INTESTINAL PEANUT (DISPOSABLE) ×5 IMPLANT
SPONGE LAP 18X18 RF (DISPOSABLE) ×5 IMPLANT
STRIP CLOSURE SKIN 1/2X4 (GAUZE/BANDAGES/DRESSINGS) ×4 IMPLANT
SUT MNCRL AB 3-0 PS2 27 (SUTURE) ×5 IMPLANT
SUT PLAIN 2 0 XLH (SUTURE) ×2 IMPLANT
SUT PLAIN 3 0 CT 1 27 (SUTURE) IMPLANT
SUT PROLENE 3 0 SH DA (SUTURE) IMPLANT
SUT VIC AB 0 CT1 18XCR BRD8 (SUTURE) ×9 IMPLANT
SUT VIC AB 0 CT1 27 (SUTURE) ×25
SUT VIC AB 0 CT1 27XBRD ANBCTR (SUTURE) ×12 IMPLANT
SUT VIC AB 0 CT1 8-18 (SUTURE) ×20
SUT VIC AB 2-0 SH 27 (SUTURE)
SUT VIC AB 2-0 SH 27XBRD (SUTURE) IMPLANT
SUT VIC AB 3-0 SH 27 (SUTURE)
SUT VIC AB 3-0 SH 27X BRD (SUTURE) IMPLANT
SUT VICRYL 0 TIES 12 18 (SUTURE) ×5 IMPLANT
SYR CONTROL 10ML LL (SYRINGE) ×5 IMPLANT
TOWEL GREEN STERILE FF (TOWEL DISPOSABLE) ×15 IMPLANT
TRAY FOLEY W/BAG SLVR 14FR (SET/KITS/TRAYS/PACK) ×5 IMPLANT

## 2020-04-13 NOTE — Progress Notes (Signed)
Day of Surgery Procedure(s) (LRB): HYSTERECTOMY ABDOMINAL (N/A) OPEN BILATERAL SALPINGECTOMY (Bilateral) APPLICATION OF CELL SAVER (N/A)  Subjective: Patient reports that pain is well managed.  Tolerating clear liquids  diet without difficulty. No nausea / vomiting.  Has ambulated in the room.   Objective: BP (!) 147/76 (BP Location: Left Arm)    Pulse 87    Temp 98.5 F (36.9 C) (Oral)    Resp 16    Ht 5' 5.5" (1.664 m)    Wt 99.9 kg    SpO2 98%    BMI 36.09 kg/m  Incision: covered  Assessment: s/p Procedure(s): HYSTERECTOMY ABDOMINAL OPEN BILATERAL SALPINGECTOMY    Plan: Advance diet and encourage ambulation Op findings reviewed Questions answered Expected discharge home 04/15/20  LOS: 0 days    Katharine Look A Jamas Jaquay 04/13/2020, 4:53 PM

## 2020-04-13 NOTE — Anesthesia Procedure Notes (Signed)
Procedure Name: Intubation Date/Time: 04/13/2020 7:50 AM Performed by: Colin Benton, CRNA Pre-anesthesia Checklist: Patient identified, Emergency Drugs available, Suction available and Patient being monitored Patient Re-evaluated:Patient Re-evaluated prior to induction Oxygen Delivery Method: Circle system utilized Preoxygenation: Pre-oxygenation with 100% oxygen Induction Type: IV induction Ventilation: Mask ventilation without difficulty Laryngoscope Size: Mac and 3 Grade View: Grade I Tube type: Oral Tube size: 7.0 mm Number of attempts: 1 Airway Equipment and Method: Stylet Placement Confirmation: ETT inserted through vocal cords under direct vision,  positive ETCO2 and breath sounds checked- equal and bilateral Secured at: 22 cm Tube secured with: Tape Dental Injury: Teeth and Oropharynx as per pre-operative assessment

## 2020-04-13 NOTE — Op Note (Signed)
Preoperative diagnosis: uterine fibroids  Postoperative diagnosis: Same  Anesthesia: General with TAP  Anesthesiologist: Dr. Gifford Shave  Procedure: Total abdominal hysterectomy with bilateral salpingectomy  Surgeon: Dr. Cletis Media  Asst.: [name] PA-C  Estimated blood loss: 200 cc  Procedure:  After being informed of the planned procedure with possible complications including but not limited to bleeding, infection, injury to other organs, informed consent is obtained and patient is taken to or #8. She is placed in the dorsal decubitus position, prepped and draped in a sterile fashion. A Foley catheter is inserted in her bladder.  We perform a vertical incision from umbilicus to pubic bone which is then brought down sharply to the fascia. The fascia is incised in a vertical fashion. Linea alba is dissected and peritoneum is entered bluntly. Bowels are retracted with abdominal packings.  Observation: The uterus is enlarged with multiple fibroids to a 22 week size. Ovaries and tubes are normal. Appendix and liver are normal.  The uterus is delivered so we clamp each round ligament, cut and suture with a transfix suture of 0 Vicryl . This gives [place] entry in the retroperitoneum. Both utero-ovarian ligaments are clamped with Rogers forceps, cut and sutured with a transfixed suture of 0-Vicryl.  The anterior broad ligament is entered sharply all the way across the lower uterine segment allowing [place] to sharply and bluntly dissect bladder below the cervix. Vascular pedicles are then identified, skeletonized and isolated on Rogers forceps. Both ureters are identified easily.  The vascular pedicle is then sectioned and sutured with a transfixed suture of 0 Vicryl.The body of the uterus is then sectioned off the cervix for better visualization.A large Alexis retractor is the easily placed.  Cardinal ligaments are then isolated on Rogers clamps, sectioned and sutured with a transfixed suture of 0 Vicryl.  Confirming bladder is dissected below the cervix we can now isolate the uterosacral ligament on each side using Rogers clamp, section at and suture with a transfixed suture of 0 Vicryl. Vaginal angles were then isolated on both sides with Rogers clamp. This allows [place] to completely remove the cervix.Vaginal angles were sutured with a transfix suture of 0 Vicryl tied to the corresponding uterosacral ligament for suspension. The vaginal cuff is then closed with figure-of-eight stitches of 0 Vicryl.Both tubes are isolated on a Rogers clamp and excised. The mesosalpynx is sutured with a transfixed suture of 0-Vicryl.  We irrigated profusely with warm saline and complete hemostasis with caiterization.  Hemostasis is then confirmed adequate. Both ureters are visualized, normal in size with normal peristaltic activity.  All sponges and retractors are removed. Under fascia hemostasis is completed with cauterization. The fascia is closed with Smith-Jones sutures of  0 Vicryl meeting. The wound is irrigated with warm saline and hemostasis is completed with cauterization. The subcuticular layer is closed with interrupted sutures of 2-0 Plain.The skin is closed with a subcuticular suture of 3-0 Monocryl and Steri-Strips.  Instruments and sponge count is complete 2.   Estimated blood loss is 200 cc after patient receives back 100 cc from [place]. Patient also received 2 units of autologous packed red cells.  The procedure is well tolerated by the patient is taken to recovery room in a well and stable condition.  Note: The Bovie cauterisation was accidentally triggered on the skin above the umbilicus. The 0.5 cm wound was cleansed and covered with steri-strip and Tegaderm.  Specimen: Uterus and  Tubes, weighing 1656 g,  sent to pathology

## 2020-04-13 NOTE — Plan of Care (Signed)
  Problem: Education: Goal: Knowledge of General Education information will improve Description: Including pain rating scale, medication(s)/side effects and non-pharmacologic comfort measures Outcome: Completed/Met

## 2020-04-13 NOTE — Discharge Instructions (Signed)
Call [facility] @ [phone] if:  You have a temperature greater than or equal to 100.4 degrees Farenheit orally You have pain that is not made better by the pain medication given and taken as directed You have excessive bleeding or problems urinating  Take Colace (Docusate Sodium/Stool Softener) 100 mg 2-3 times daily while taking narcotic pain medicine to avoid constipation or until bowel movements are regular.  Take Ibuprofen 600 mg with food and  Acetaminophen 500 mg (#2 tablets) every 6 hours for 5 days then as needed for pain  You may drive after 2 weeks You may walk up steps  You may shower  You may resume a regular diet  Keep incisions clean and dry Do not lift over 15 pounds for 6 weeks Avoid anything in vagina for 6 weeks (or until after your post-operative visit)

## 2020-04-13 NOTE — Transfer of Care (Signed)
Immediate Anesthesia Transfer of Care Note  Patient: Michele [name]  Procedure(s) Performed: HYSTERECTOMY ABDOMINAL (N/A Abdomen) OPEN BILATERAL SALPINGECTOMY (Bilateral Abdomen) APPLICATION OF CELL SAVER (N/A )  Patient Location: PACU  Anesthesia Type:GA combined with regional for post-op pain  Level of Consciousness: drowsy and patient cooperative  Airway & Oxygen Therapy: Patient Spontanous Breathing and Patient connected to nasal cannula oxygen  Post-op Assessment: Report given to RN and Post -op Vital signs reviewed and stable  Post vital signs: Reviewed and stable  Last Vitals:  Vitals Value Taken Time  BP 132/85 04/13/20 1008  Temp 36.8 C 04/13/20 1008  Pulse 85 04/13/20 1011  Resp 15 04/13/20 1011  SpO2 99 % 04/13/20 1011  Vitals shown include unvalidated device data.  Last Pain:  Vitals:   04/13/20 0621  TempSrc:   PainSc: 0-No pain         Complications: No complications documented.

## 2020-04-13 NOTE — Anesthesia Procedure Notes (Signed)
Anesthesia Regional Block: TAP block   Pre-Anesthetic Checklist: ,, timeout performed, Correct Patient, Correct Site, Correct Laterality, Correct Procedure, Correct Position, site marked, Risks and benefits discussed,  Surgical consent,  Pre-op evaluation,  At surgeon's request and post-op pain management  Laterality: Right  Prep: chloraprep       Needles:  Injection technique: Single-shot  Needle Type: Echogenic Needle     Needle Length: 9cm  Needle Gauge: 21     Additional Needles:   Procedures:,,,, ultrasound used (permanent image in chart),,,,  Narrative:  Start time: 04/13/2020 7:10 AM End time: 04/13/2020 7:15 AM Injection made incrementally with aspirations every 5 mL.  Performed by: Personally  Anesthesiologist: Catalina Gravel, MD  Additional Notes: No pain on injection. No increased resistance to injection. Injection made in 5cc increments.  Good needle visualization.  Patient tolerated procedure well.

## 2020-04-13 NOTE — Anesthesia Procedure Notes (Signed)
Anesthesia Regional Block: TAP block   Pre-Anesthetic Checklist: ,, timeout performed, Correct Patient, Correct Site, Correct Laterality, Correct Procedure, Correct Position, site marked, Risks and benefits discussed,  Surgical consent,  Pre-op evaluation,  At surgeon's request and post-op pain management  Laterality: Left  Prep: chloraprep       Needles:  Injection technique: Single-shot  Needle Type: Echogenic Needle     Needle Length: 9cm  Needle Gauge: 21     Additional Needles:   Procedures:,,,, ultrasound used (permanent image in chart),,,,  Narrative:  Start time: 04/13/2020 7:05 AM End time: 04/13/2020 7:10 AM Injection made incrementally with aspirations every 5 mL.  Performed by: Personally  Anesthesiologist: Catalina Gravel, MD  Additional Notes: No pain on injection. No increased resistance to injection. Injection made in 5cc increments.  Good needle visualization.  Patient tolerated procedure well.

## 2020-04-13 NOTE — Anesthesia Procedure Notes (Deleted)
Lumbar Drain

## 2020-04-13 NOTE — Interval H&P Note (Signed)
History and Physical Interval Note:  04/13/2020 7:40 AM  Michele [name]  has presented today for surgery, with the diagnosis of FIBROIDS DYSFUNCTIONAL UTERINE BLEEDING.  The various methods of treatment have been discussed with the patient and family. After consideration of risks, benefits and other options for treatment, the patient has consented to  Procedure(s) with comments: HYSTERECTOMY ABDOMINAL (N/A) - AUTO INFUSION PROTOCOL OPEN BILATERAL SALPINGECTOMY (Bilateral) APPLICATION OF CELL SAVER (N/A) as a surgical intervention.  [name] history has been reviewed, patient examined, no change in status, stable for surgery.  I have reviewed the patient's chart and labs.  Questions were answered to the patient's satisfaction.     Katharine Look A Devine Klingel

## 2020-04-14 ENCOUNTER — Encounter (HOSPITAL_COMMUNITY): Payer: Self-pay | Admitting: Obstetrics and Gynecology

## 2020-04-14 LAB — TYPE AND SCREEN
ABO/RH(D): A POS
Antibody Screen: NEGATIVE
Unit division: 0
Unit division: 0

## 2020-04-14 LAB — BASIC METABOLIC PANEL
Anion gap: 9 (ref 5–15)
BUN: 5 mg/dL — ABNORMAL LOW (ref 6–20)
CO2: 25 mmol/L (ref 22–32)
Calcium: 8.4 mg/dL — ABNORMAL LOW (ref 8.9–10.3)
Chloride: 104 mmol/L (ref 98–111)
Creatinine, Ser: 0.69 mg/dL (ref 0.44–1.00)
GFR, Estimated: >60 mL/min (ref >60)
Glucose, Bld: 125 mg/dL — ABNORMAL HIGH (ref 70–99)
Potassium: 3.5 mmol/L (ref 3.5–5.1)
Sodium: 138 mmol/L (ref 135–145)

## 2020-04-14 LAB — CBC
HCT: 34.7 % — ABNORMAL LOW (ref 36.0–46.0)
Hemoglobin: 11.1 g/dL — ABNORMAL LOW (ref 12.0–15.0)
MCH: 27.4 pg (ref 26.0–34.0)
MCHC: 32 g/dL (ref 30.0–36.0)
MCV: 85.7 fL (ref 80.0–100.0)
Platelets: 253 10*3/uL (ref 150–400)
RBC: 4.05 MIL/uL (ref 3.87–5.11)
RDW: 14 % (ref 11.5–15.5)
WBC: 17.9 10*3/uL — ABNORMAL HIGH (ref 4.0–10.5)
nRBC: 0 % (ref 0.0–0.2)

## 2020-04-14 LAB — BPAM RBC
Blood Product Expiration Date: 202112222359
Blood Product Expiration Date: 202201012359
ISSUE DATE / TIME: 202112160910
ISSUE DATE / TIME: 202112160910
Unit Type and Rh: 6200
Unit Type and Rh: 6200

## 2020-04-14 MED FILL — Heparin Sodium (Porcine) Inj 1000 Unit/ML: INTRAMUSCULAR | Qty: 30 | Status: AC

## 2020-04-14 MED FILL — Sodium Chloride IV Soln 0.9%: INTRAVENOUS | Qty: 2000 | Status: AC

## 2020-04-14 NOTE — Progress Notes (Addendum)
Michele [name] is a71 y.o.  [id number]  Post Op Date # 1: TAH/BS/LOA  Subjective: Patient is Doing well postoperatively. Patient has Pain is controlled with current analgesics. Medications being used: prescription NSAID's including Ketorolac 30 mg IV and narcotic analgesics including Oxycodone 5 mg. Tolerating full liquids, ambulating in the halls without dizziness however has not voided yet-Foley removed only an hour ago.   Objective: Vital signs in last 24 hours: Temp:  [98 F (36.7 C)-98.9 F (37.2 C)] 98.5 F (36.9 C) (12/17 0350) Pulse Rate:  [65-112] 78 (12/17 0350) Resp:  [10-18] 16 (12/17 0350) BP: (112-147)/(67-91) 112/73 (12/17 0350) SpO2:  [95 %-100 %] 96 % (12/17 0350)  Intake/Output from previous day: 12/16 0701 - 12/17 0700 In: 3772.6 [P.O.:1080; I.V.:1862.6] Out: 2745 [Urine:2395] Intake/Output this shift: No intake/output data recorded. Recent Labs  Lab 04/07/20 1600 04/13/20 1344 04/14/20 0550  WBC 10.9* 20.6* 17.9*  HGB 11.1* 12.6 11.1*  HCT 36.5 40.3 34.7*  PLT 341 252 253     Recent Labs  Lab 04/07/20 1600 04/13/20 1344 04/14/20 0550  NA 137  --  138  K 3.4*  --  3.5  CL 101  --  104  CO2 24  --  25  BUN 14  --  5*  CREATININE 0.86 0.72 0.69  CALCIUM 9.6  --  8.4*  GLUCOSE 83  --  125*    EXAM: General: alert, cooperative and no distress Resp: clear to auscultation bilaterally Cardio: regular rate and rhythm, S1, S2 normal, no murmur, click, rub or gallop GI: bowel sounds present; dressing with several spots of dried blood but intact Extremities: Homans sign is negative, no sign of DVT and no calf tenderness   Assessment: s/p Procedure(s): HYSTERECTOMY ABDOMINAL OPEN BILATERAL SALPINGECTOMY APPLICATION OF CELL SAVER: stable, progressing well and tolerating diet  Plan: Advance diet Encourage ambulation Routine care.  LOS: 1 day    [name], PA-C 04/14/2020 7:38 AM   Seen and agreed Discharge home expected  tomorrow

## 2020-04-15 MED ORDER — OXYCODONE HCL 5 MG PO TABS: 5.0000 mg | ORAL_TABLET | ORAL | 0 refills | Status: AC | PRN

## 2020-04-15 MED ORDER — IBUPROFEN 800 MG PO TABS: 800.0000 mg | ORAL_TABLET | Freq: Three times a day (TID) | ORAL | 0 refills | Status: DC | PRN

## 2020-04-15 MED ORDER — ACETAMINOPHEN 500 MG PO TABS: 1000.0000 mg | ORAL_TABLET | Freq: Three times a day (TID) | ORAL | 0 refills | Status: DC | PRN

## 2020-04-15 NOTE — Progress Notes (Signed)
2 Days Post-Op Procedure(s) (LRB): HYSTERECTOMY ABDOMINAL (N/A) OPEN BILATERAL SALPINGECTOMY (Bilateral) APPLICATION OF CELL SAVER (N/A)  Subjective: Patient reports tolerating PO, + flatus, + BM and no problems voiding.   Pain is well controlled.  Objective: I have reviewed patient's vital signs, intake and output and labs.  General: alert, cooperative and no distress Resp: normal effort no distress  GI: soft appropriately tender nondistended +BS in all 4 quadrants  Extremities: extremities normal, atraumatic, no cyanosis or edema Incision: bandage clean dry and intact   Assessment: s/p Procedure(s) with comments: HYSTERECTOMY ABDOMINAL (N/A) - AUTO INFUSION PROTOCOL OPEN BILATERAL SALPINGECTOMY (Bilateral) APPLICATION OF CELL SAVER (N/A): stable return of bowel and bladder function noted.   Plan: Encourage ambulation Discharge home  Follow up with Dr. Cletis Media at postoperative visit   LOS: 2 days    Christophe Louis 04/15/2020, 7:56 AM

## 2020-04-15 NOTE — Progress Notes (Signed)
Teaching complete pts questions answered

## 2020-04-17 NOTE — Anesthesia Postprocedure Evaluation (Signed)
Anesthesia Post Note  Patient: Michele [name]  Procedure(s) Performed: HYSTERECTOMY ABDOMINAL (N/A Abdomen) OPEN BILATERAL SALPINGECTOMY (Bilateral Abdomen) APPLICATION OF CELL SAVER (N/A )     Patient location during evaluation: PACU Anesthesia Type: General Level of consciousness: awake and alert Pain management: pain level controlled Vital Signs Assessment: post-procedure vital signs reviewed and stable Respiratory status: spontaneous breathing, nonlabored ventilation, respiratory function stable and patient connected to nasal cannula oxygen Cardiovascular status: blood pressure returned to baseline and stable Postop Assessment: no apparent nausea or vomiting Anesthetic complications: no   No complications documented.  Last Vitals:  Vitals:   04/15/20 0451 04/15/20 0838  BP: 124/66 116/70  Pulse: 72 61  Resp: 18 18  Temp: 36.7 C 36.9 C  SpO2: 97% 99%    Last Pain:  Vitals:   04/15/20 0838  TempSrc: Oral  PainSc:                  Stigler S

## 2020-04-17 NOTE — Discharge Summary (Signed)
Physician Discharge Summary  Patient ID: Michele [name] MRN: [id number] DOB/AGE: 43-20-1977 43 y.o.  Admit date: 04/13/2020 Discharge date: 04/17/2020  Admission Diagnoses: Fibroids [D21.9]  Discharge Diagnoses: Fibroids   Discharged Condition: good  Hospital Course: Patient underwent an uncomplicated Total abdominal hysterectomy with bilateral salpyngectomy on 04/13/20. During surgery, she was given 100 cc of Packed red cells through [place] as well as 2 units of autologous Packed red cells. Her post0operative course was uncomplicated.   Physical Exam:   BP 116/70 (BP Location: Right Arm)    Pulse 61    Temp 98.5 F (36.9 C) (Oral)    Resp 18    Ht 5' 5.5" (1.664 m)    Wt 99.9 kg    SpO2 99%    BMI 36.09 kg/m  Constitutional: alert, in no distress Cardiovascular: lungs clear, heart with normal rate and rhythm Abdomen: Incision (s) dry and clean, appropriately tender Extremities: no significant edema and no evidence of DVT  Hgb: 11.1   Consults: None   Disposition: Discharge disposition: 01-Home or Self Care       Discharge Instructions     Remove dressing in 72 hours   Complete by: As directed    Call MD for:  persistant nausea and vomiting   Complete by: As directed    Call MD for:  severe uncontrolled pain   Complete by: As directed    Call MD for:  temperature >100.4   Complete by: As directed    Diet - low sodium heart healthy   Complete by: As directed    Driving Restrictions   Complete by: As directed    Avoid driving for 2 weeks   Increase activity slowly   Complete by: As directed    Lifting restrictions   Complete by: As directed    Avoid lifting over 10 lbs   May shower / Bathe   Complete by: As directed    May walk up steps   Complete by: As directed    Sexual Activity Restrictions   Complete by: As directed    Avoid sexual activity for 6-8 weeks and until approved by Dr. Cletis Media     Allergies as of 04/15/2020   No Known Allergies      Medication List    TAKE these medications   acetaminophen 500 MG tablet Commonly known as: TYLENOL Take 2 tablets (1,000 mg total) by mouth every 8 (eight) hours as needed for mild pain or moderate pain.   betamethasone dipropionate 0.05 % cream Apply topically 2 (two) times daily. What changed:   how much to take  when to take this  reasons to take this   blood glucose meter kit and supplies Kit Dispense based on patient and insurance preference. Use up to four times daily as directed. (FOR ICD-9 250.00, 250.01).   chlorhexidine 0.12 % solution Commonly known as: PERIDEX Use as directed 15 mLs in the mouth or throat daily as needed (gum irritation).   gabapentin 100 MG capsule Commonly known as: NEURONTIN Take 2 capsules (200 mg total) by mouth at bedtime. What changed: how much to take   ibuprofen 800 MG tablet Commonly known as: ADVIL Take 1 tablet (800 mg total) by mouth every 8 (eight) hours as needed for mild pain or moderate pain.   lisinopril-hydrochlorothiazide 20-12.5 MG tablet Commonly known as: ZESTORETIC TAKE 1 TABLET BY MOUTH EVERY DAY   oxyCODONE 5 MG immediate release tablet Commonly known as: Oxy IR/ROXICODONE Take 1 tablet (5  mg total) by mouth every 4 (four) hours as needed for up to 7 days for severe pain.   Vitamin D 50 MCG (2000 UT) Caps Take 1 capsule (2,000 Units total) by mouth daily.       Follow-up Information    Delsa Bern, MD On 05/03/2020.   Specialty: Obstetrics and Gynecology Why: appointment time is at 1:30 p.m.;   YOUR 6 weeks post operative visit is May 25, 2020 at 1:30 pm. Contact information: [address] [place] [address] [phone]               Signed: Alwyn Pea, MD 04/17/2020, 11:09 AM

## 2020-04-18 LAB — SURGICAL PATHOLOGY

## 2020-05-10 DIAGNOSIS — Z1152 Encounter for screening for COVID-19: Secondary | ICD-10-CM | POA: Diagnosis not present

## 2020-06-06 ENCOUNTER — Ambulatory Visit (INDEPENDENT_AMBULATORY_CARE_PROVIDER_SITE_OTHER): Payer: Federal, State, Local not specified - PPO | Admitting: Family Medicine

## 2020-06-06 ENCOUNTER — Other Ambulatory Visit: Payer: Self-pay

## 2020-06-06 ENCOUNTER — Encounter: Payer: Self-pay | Admitting: Family Medicine

## 2020-06-06 VITALS — BP 110/80 | HR 82 | Ht 65.5 in | Wt 224.0 lb

## 2020-06-06 DIAGNOSIS — M545 Low back pain, unspecified: Secondary | ICD-10-CM | POA: Diagnosis not present

## 2020-06-06 DIAGNOSIS — G8929 Other chronic pain: Secondary | ICD-10-CM | POA: Diagnosis not present

## 2020-06-06 MED ORDER — MELOXICAM 7.5 MG PO TABS: 7.5000 mg | ORAL_TABLET | Freq: Every day | ORAL | 0 refills | Status: DC

## 2020-06-06 NOTE — Progress Notes (Signed)
  [facility] [address] [place] Phone: [phone] Subjective:   I Michele [name] am serving as a Education administrator for Dr. Hulan Saas.  This visit occurred during the SARS-CoV-2 public health emergency.  Safety protocols were in place, including screening questions prior to the visit, additional usage of staff PPE, and extensive cleaning of exam room while observing appropriate contact time as indicated for disinfecting solutions.   I'm seeing this patient by the request  of:  Koberlein, Steele Berg, MD  CC: Back pain follow-up  CXK:[id number]  Michele [name] is a 45 y.o. female coming in with complaint of back and neck pain. OMT 04/05/2020. Hysterectomy in 03/2020. Patient states her back still bothers her. Gabapentin is not helping.  Patient states that the pain is fairly severe.  Patient states tightness affecting daily activities.  Can be very uncomfortable at night.  Seems to be giving her more discomfort even during the daytime.  Medications patient has been prescribed: Gabapentin  Taking: Yes  Since we have seen patient patient did have a hysterectomy in December 2021   Patient does have back x-rays taken from February 2021 showing moderate to severe degenerative disc disease mostly at the L5-S1.    Reviewed prior external information including notes and imaging from previsou exam, outside providers and external EMR if available.   As well as notes that were available from care everywhere and other healthcare systems.  Past medical history, social, surgical and family history all reviewed in electronic medical record.  No pertanent information unless stated regarding to the chief complaint.   Past Medical History:  Diagnosis Date  . Diabetes mellitus without complication ([place])   . Hypertension    under control with med., has been on med. x 2 yr.  . Hypertrophy of breast 03/2013  . Obesity     No Known Allergies   Review of  Systems:  No headache, visual changes, nausea, vomiting, diarrhea, constipation, dizziness, abdominal pain, skin rash, fevers, chills, night sweats, weight loss, swollen lymph nodes, body aches, joint swelling, chest pain, shortness of breath, mood changes. POSITIVE muscle aches  Objective  There were no vitals taken for this visit.   General: No apparent distress alert and oriented x3 mood and affect normal, dressed appropriately.  HEENT: Pupils equal, extraocular movements intact  Respiratory: Patient's speak in full sentences and does not appear short of breath  Cardiovascular: No lower extremity edema, non tender, no erythema  Back exam does have loss of lordosis.  Tender to palpation in paraspinal musculature of the lumbar spine right greater than left.  Tightness with straight leg test.  Questionable radicular symptoms also noted.  5-5 strength of the lower extremities.      Assessment and Plan:        The above documentation has been reviewed and is accurate and complete Lyndal Pulley, DO       Note: This dictation was prepared with Dragon dictation along with smaller phrase technology. Any transcriptional errors that result from this process are unintentional.

## 2020-06-06 NOTE — Patient Instructions (Addendum)
Good to see you MRI lumbar Meloxicam 7.5 daily for 10 days then as needed Do not use NSAIDS such as Advil or Aleve when taking Meloxicam It is ok to use Tylenol for additional pain relief We will talk about MRI and next steps being PT, injection or manipulation

## 2020-06-06 NOTE — Assessment & Plan Note (Signed)
Much somewhat helpful.patient with back pain could have been secondary to the fibroid but with hysterectomy and continuing to have pain does not seem like so.  Patient is having worsening pain at this time with radicular symptoms.  Affecting daily activities.  Patient's x-rays show the patient did have arthritic changes noted severely of the L5-S1 which could correspond with some of the patient's discomfort and pain.  I do feel that with patient now having this pain and failing such things as home exercises, previous physical therapy years ago, medications such as gabapentin MRI is necessary.  We did discuss the possibility of Cymbalta in the long run and potentially patient would be a candidate for epidural steroid injections.  Short course of meloxicam given today.  Patient will have the advanced imaging and then we will discuss further.

## 2020-06-23 ENCOUNTER — Ambulatory Visit
Admission: RE | Admit: 2020-06-23 | Discharge: 2020-06-23 | Disposition: A | Discharge status: Home / Self Care | Payer: Federal, State, Local not specified - PPO | Source: Ambulatory Visit | Attending: Family Medicine | Admitting: Family Medicine

## 2020-06-23 DIAGNOSIS — G8929 Other chronic pain: Secondary | ICD-10-CM

## 2020-06-23 DIAGNOSIS — M545 Low back pain, unspecified: Secondary | ICD-10-CM

## 2020-06-26 ENCOUNTER — Encounter: Payer: Self-pay | Admitting: Family Medicine

## 2020-08-09 DIAGNOSIS — N83201 Unspecified ovarian cyst, right side: Secondary | ICD-10-CM | POA: Diagnosis not present

## 2020-09-06 ENCOUNTER — Encounter: Payer: Self-pay | Admitting: Family Medicine

## 2020-09-06 ENCOUNTER — Other Ambulatory Visit: Payer: Self-pay

## 2020-09-06 ENCOUNTER — Ambulatory Visit (INDEPENDENT_AMBULATORY_CARE_PROVIDER_SITE_OTHER): Payer: Federal, State, Local not specified - PPO | Admitting: Family Medicine

## 2020-09-06 VITALS — BP 120/70 | HR 78 | Temp 98.5°F | Ht 65.5 in | Wt 228.1 lb

## 2020-09-06 DIAGNOSIS — E049 Nontoxic goiter, unspecified: Secondary | ICD-10-CM

## 2020-09-06 DIAGNOSIS — D500 Iron deficiency anemia secondary to blood loss (chronic): Secondary | ICD-10-CM | POA: Diagnosis not present

## 2020-09-06 DIAGNOSIS — D72819 Decreased white blood cell count, unspecified: Secondary | ICD-10-CM | POA: Diagnosis not present

## 2020-09-06 DIAGNOSIS — R739 Hyperglycemia, unspecified: Secondary | ICD-10-CM

## 2020-09-06 DIAGNOSIS — Z0000 Encounter for general adult medical examination without abnormal findings: Secondary | ICD-10-CM | POA: Diagnosis not present

## 2020-09-06 DIAGNOSIS — R002 Palpitations: Secondary | ICD-10-CM

## 2020-09-06 DIAGNOSIS — I10 Essential (primary) hypertension: Secondary | ICD-10-CM

## 2020-09-06 DIAGNOSIS — Z13220 Encounter for screening for lipoid disorders: Secondary | ICD-10-CM

## 2020-09-06 LAB — CBC WITH DIFFERENTIAL/PLATELET
Basophils Absolute: 0 10*3/uL (ref 0.0–0.1)
Basophils Relative: 0.5 % (ref 0.0–3.0)
Eosinophils Absolute: 0.1 10*3/uL (ref 0.0–0.7)
Eosinophils Relative: 0.9 % (ref 0.0–5.0)
HCT: 36.4 % (ref 36.0–46.0)
Hemoglobin: 12 g/dL (ref 12.0–15.0)
Lymphocytes Relative: 18.6 % (ref 12.0–46.0)
Lymphs Abs: 1.7 10*3/uL (ref 0.7–4.0)
MCHC: 33 g/dL (ref 30.0–36.0)
MCV: 85.5 fl (ref 78.0–100.0)
Monocytes Absolute: 0.5 10*3/uL (ref 0.1–1.0)
Monocytes Relative: 5.8 % (ref 3.0–12.0)
Neutro Abs: 6.6 10*3/uL (ref 1.4–7.7)
Neutrophils Relative %: 74.2 % (ref 43.0–77.0)
Platelets: 289 10*3/uL (ref 150.0–400.0)
RBC: 4.26 Mil/uL (ref 3.87–5.11)
RDW: 14.6 % (ref 11.5–15.5)
WBC: 8.9 10*3/uL (ref 4.0–10.5)

## 2020-09-06 LAB — COMPREHENSIVE METABOLIC PANEL
ALT: 17 U/L (ref 0–35)
AST: 15 U/L (ref 0–37)
Albumin: 4.2 g/dL (ref 3.5–5.2)
Alkaline Phosphatase: 87 U/L (ref 39–117)
BUN: 13 mg/dL (ref 6–23)
CO2: 28 mEq/L (ref 19–32)
Calcium: 9.4 mg/dL (ref 8.4–10.5)
Chloride: 104 mEq/L (ref 96–112)
Creatinine, Ser: 0.64 mg/dL (ref 0.40–1.20)
GFR: 108.18 mL/min (ref >60.00)
Glucose, Bld: 95 mg/dL (ref 70–99)
Potassium: 4.1 mEq/L (ref 3.5–5.1)
Sodium: 140 mEq/L (ref 135–145)
Total Bilirubin: 0.7 mg/dL (ref 0.2–1.2)
Total Protein: 6.9 g/dL (ref 6.0–8.3)

## 2020-09-06 LAB — FERRITIN: Ferritin: 59.2 ng/mL (ref 10.0–291.0)

## 2020-09-06 LAB — LIPID PANEL
Cholesterol: 154 mg/dL (ref 0–200)
HDL: 49.6 mg/dL (ref >39.00)
LDL Cholesterol: 91 mg/dL (ref 0–99)
NonHDL: 103.92
Total CHOL/HDL Ratio: 3
Triglycerides: 64 mg/dL (ref 0.0–149.0)
VLDL: 12.8 mg/dL (ref 0.0–40.0)

## 2020-09-06 LAB — T4, FREE: Free T4: 0.83 ng/dL (ref 0.60–1.60)

## 2020-09-06 LAB — TSH: TSH: 0.92 u[IU]/mL (ref 0.35–4.50)

## 2020-09-06 LAB — HEMOGLOBIN A1C: Hgb A1c MFr Bld: 5.9 % (ref 4.6–6.5)

## 2020-09-06 LAB — T3, FREE: T3, Free: 3 pg/mL (ref 2.3–4.2)

## 2020-09-06 NOTE — Progress Notes (Signed)
[facility] DOB: 1976-03-25 Encounter date: 09/06/2020  This is a 45 y.o. female who presents for complete physical   History of present illness/Additional concerns: Patient underwent total abdominal hysterectomy with bilateral salpingectomy on 04/13/2020 for fibroids with Dr. Cletis Media. She is doing well after surgery.   Was seeing Dr. Tamala Julian for her lower back pain.  Has known moderate to severe degenerative disc disease most significant at L5-S1. States that back is doing better. Has been going to chiropractor. She was not excited about epidural injections. Chiropractor treatments are helping. Was put on medicine at one point but it made bp elevate, so stopped.   Last visit with me was 02/2020.  We have been monitoring her hypertension as well as hyperglycemia at that time. She states she was rushing to get here so thinks that caused diastolic elevation this morning. Usually in 115-120's/70-80's. No issues with headaches. My recheck was normal.  She is exercising regularly - walking 5 days/week. Couple of weeks ago was walking; and heart started racing at end of walk - HR was up to 160; stopped and it went down. States she felt like it was in her throat; made her a little short of breath. Not happened to her in the past; hasn't happened since. Nothing different that day with walking. Doesn't drink coffee - rare cheerwine. Good about staying hydrated.   Past Medical History:  Diagnosis Date  . Diabetes mellitus without complication ([place])   . Hypertension    under control with med., has been on med. x 2 yr.  . Hypertrophy of breast 03/2013  . Obesity    Past Surgical History:  Procedure Laterality Date  . ABDOMINAL HYSTERECTOMY N/A 04/13/2020   Procedure: HYSTERECTOMY ABDOMINAL;  Surgeon: Delsa Bern, MD;  Location: [facility];  Service: Gynecology;  Laterality: N/A;  AUTO INFUSION PROTOCOL  . BILATERAL SALPINGECTOMY Bilateral 04/13/2020   Procedure: OPEN BILATERAL SALPINGECTOMY;   Surgeon: Delsa Bern, MD;  Location: [facility];  Service: Gynecology;  Laterality: Bilateral;  . BREAST REDUCTION SURGERY Bilateral 04/26/2013   Procedure: BILATERAL BREAST REDUCTION WITH LIPOSUCTION;  Surgeon: Cristine Polio, MD;  Location: [facility];  Service: Plastics;  Laterality: Bilateral;  . CHOLECYSTECTOMY N/A 05/10/2016   Procedure: LAPAROSCOPIC CHOLECYSTECTOMY WITH INTRAOPERATIVE CHOLANGIOGRAM;  Surgeon: Donnie Mesa, MD;  Location: [facility];  Service: General;  Laterality: N/A;  . FOOT SURGERY  2003; 2004   each foot - bunion, hammer toe   No Known Allergies Current Meds  Medication Sig  . blood glucose meter kit and supplies KIT Dispense based on patient and insurance preference. Use up to four times daily as directed. (FOR ICD-9 250.00, 250.01).  . chlorhexidine (PERIDEX) 0.12 % solution Use as directed 15 mLs in the mouth or throat daily as needed (gum irritation).  . Cholecalciferol (VITAMIN D) 50 MCG (2000 UT) CAPS Take 1 capsule (2,000 Units total) by mouth daily.  Marland Kitchen lisinopril-hydrochlorothiazide (ZESTORETIC) 20-12.5 MG tablet TAKE 1 TABLET BY MOUTH EVERY DAY   Social History   Tobacco Use  . Smoking status: Never Smoker  . Smokeless tobacco: Never Used  Substance Use Topics  . Alcohol use: Yes    Comment: rarely   Family History  Problem Relation Age of Onset  . Cancer Father 52       Prostate  . Heart disease Father        bypass  . Heart attack Father 68  . Hypertension Father   . Migraines Mother   . Arthritis Mother   .  Deep vein thrombosis Mother        started after ortho surgery; lifetime  . Hyperlipidemia Mother   . Cancer Paternal Grandmother        blood?  . Cancer Paternal Grandfather        prostate  . Headache Brother   . Other Brother        ?something with heart; under eval  . Glaucoma Maternal Grandmother   . Head & neck cancer Maternal Grandmother   . Alzheimer's disease Maternal Grandfather   . Heart attack Paternal  Uncle 68     Review of Systems  Constitutional: Negative for activity change, appetite change, chills, fatigue, fever and unexpected weight change.  HENT: Negative for congestion, ear pain, hearing loss, sinus pressure, sinus pain, sore throat and trouble swallowing.   Eyes: Negative for pain and visual disturbance.  Respiratory: Negative for cough, chest tightness, shortness of breath and wheezing.   Cardiovascular: Positive for palpitations (see hpi). Negative for chest pain and leg swelling.  Gastrointestinal: Negative for abdominal pain, blood in stool, constipation, diarrhea, nausea and vomiting.  Genitourinary: Negative for difficulty urinating and menstrual problem.  Musculoskeletal: Positive for back pain. Negative for arthralgias.  Skin: Negative for rash.  Neurological: Negative for dizziness, weakness, numbness and headaches.  Hematological: Negative for adenopathy. Does not bruise/bleed easily.  Psychiatric/Behavioral: Negative for sleep disturbance and suicidal ideas. The patient is not nervous/anxious.     CBC:  Lab Results  Component Value Date   WBC 8.9 09/06/2020   HGB 12.0 09/06/2020   HCT 36.4 09/06/2020   MCH 27.4 04/14/2020   MCHC 33.0 09/06/2020   RDW 14.6 09/06/2020   PLT 289.0 09/06/2020   CMP: Lab Results  Component Value Date   NA 140 09/06/2020   K 4.1 09/06/2020   CL 104 09/06/2020   CO2 28 09/06/2020   ANIONGAP 9 04/14/2020   GLUCOSE 95 09/06/2020   BUN 13 09/06/2020   CREATININE 0.64 09/06/2020   CREATININE 0.76 05/06/2016   GFRAA >60 05/14/2016   GFRAA >89 05/06/2016   CALCIUM 9.4 09/06/2020   PROT 6.9 09/06/2020   BILITOT 0.7 09/06/2020   ALKPHOS 87 09/06/2020   ALT 17 09/06/2020   AST 15 09/06/2020   LIPID: Lab Results  Component Value Date   CHOL 154 09/06/2020   TRIG 64.0 09/06/2020   HDL 49.60 09/06/2020   LDLCALC 91 09/06/2020    Objective:  BP 120/70 (BP Location: Left Arm, Patient Position: Sitting, Cuff Size: Large)    Pulse 78   Temp 98.5 F (36.9 C) (Oral)   Ht 5' 5.5" (1.664 m)   Wt 228 lb 1.6 oz (103.5 kg)   SpO2 98%   BMI 37.38 kg/m   Weight: 228 lb 1.6 oz (103.5 kg)   BP Readings from Last 3 Encounters:  09/06/20 120/70  06/06/20 110/80  04/15/20 116/70   Wt Readings from Last 3 Encounters:  09/06/20 228 lb 1.6 oz (103.5 kg)  06/06/20 224 lb (101.6 kg)  04/13/20 220 lb 3.8 oz (99.9 kg)    Physical Exam Constitutional:      General: She is not in acute distress.    Appearance: She is well-developed.  HENT:     Head: Normocephalic and atraumatic.     Right Ear: External ear normal.     Left Ear: External ear normal.     Mouth/Throat:     Pharynx: No oropharyngeal exudate.  Eyes:     Conjunctiva/sclera: Conjunctivae normal.  Pupils: Pupils are equal, round, and reactive to light.  Neck:     Thyroid: Thyromegaly (right lob more prominent) present.  Cardiovascular:     Rate and Rhythm: Normal rate and regular rhythm.     Heart sounds: Normal heart sounds. No murmur heard. No friction rub. No gallop.   Pulmonary:     Effort: Pulmonary effort is normal.     Breath sounds: Normal breath sounds.  Abdominal:     General: Bowel sounds are normal. There is no distension.     Palpations: Abdomen is soft. There is no mass.     Tenderness: There is no abdominal tenderness. There is no guarding.     Hernia: No hernia is present.  Musculoskeletal:        General: No tenderness or deformity. Normal range of motion.     Cervical back: Normal range of motion and neck supple.  Lymphadenopathy:     Cervical: No cervical adenopathy.  Skin:    General: Skin is warm and dry.     Findings: No rash.  Neurological:     Mental Status: She is alert and oriented to person, place, and time.     Deep Tendon Reflexes: Reflexes normal.     Reflex Scores:      Tricep reflexes are 2+ on the right side and 2+ on the left side.      Bicep reflexes are 2+ on the right side and 2+ on the left side.       Brachioradialis reflexes are 2+ on the right side and 2+ on the left side.      Patellar reflexes are 2+ on the right side and 2+ on the left side. Psychiatric:        Speech: Speech normal.        Behavior: Behavior normal.        Thought Content: Thought content normal.     Assessment/Plan: There are no preventive care reminders to display for this patient. Health Maintenance reviewed.  1. Preventative health care Keep up with regular exercise and healthy eating.  She is up-to-date with preventative health care.  2. Leukopenia, unspecified type Recheck blood work today.  3. Blood loss anemia - CBC with Differential/Platelet; Future - Ferritin; Future - Ferritin - CBC with Differential/Platelet  4. Essential hypertension Well controlled; continue current medication.  - CBC with Differential/Platelet; Future - Comprehensive metabolic panel; Future - Comprehensive metabolic panel - CBC with Differential/Platelet  5. Hyperglycemia - Hemoglobin A1c; Future - Hemoglobin A1c  6. Lipid screening - Lipid panel; Future - Lipid panel  7. Palpitations EKG sinus rhythm, no acute changes. We will start with bloodwork today and consider additional evaluation if symptoms recur since this was limited one time episode. We discussed triggers for palpitations including caffeine, alcohol, stress, etc. - TSH; Future - EKG 12-Lead - TSH  8. Enlarged thyroid gland Start with bloodwork and [place]. - T4, free; Future - T3, free; Future - US THYROID; Future - T3, free - T4, free  Return in about 6 months (around 03/09/2021) for Chronic condition visit.  Micheline Rough, MD

## 2020-09-08 ENCOUNTER — Ambulatory Visit
Admission: RE | Admit: 2020-09-08 | Discharge: 2020-09-08 | Disposition: A | Discharge status: Home / Self Care | Payer: Federal, State, Local not specified - PPO | Source: Ambulatory Visit | Attending: Family Medicine | Admitting: Family Medicine

## 2020-09-08 DIAGNOSIS — E010 Iodine-deficiency related diffuse (endemic) goiter: Secondary | ICD-10-CM | POA: Diagnosis not present

## 2020-09-08 DIAGNOSIS — E049 Nontoxic goiter, unspecified: Secondary | ICD-10-CM

## 2020-09-10 ENCOUNTER — Other Ambulatory Visit: Payer: Self-pay | Admitting: Family Medicine

## 2020-09-10 DIAGNOSIS — E042 Nontoxic multinodular goiter: Secondary | ICD-10-CM

## 2020-09-11 ENCOUNTER — Encounter: Payer: Self-pay | Admitting: Family Medicine

## 2020-09-11 ENCOUNTER — Other Ambulatory Visit: Payer: Federal, State, Local not specified - PPO

## 2020-09-11 ENCOUNTER — Other Ambulatory Visit: Payer: Self-pay | Admitting: Family Medicine

## 2020-09-11 DIAGNOSIS — E041 Nontoxic single thyroid nodule: Secondary | ICD-10-CM

## 2020-09-20 ENCOUNTER — Ambulatory Visit
Admission: RE | Admit: 2020-09-20 | Discharge: 2020-09-20 | Disposition: A | Discharge status: Home / Self Care | Payer: Federal, State, Local not specified - PPO | Source: Ambulatory Visit | Attending: Family Medicine | Admitting: Family Medicine

## 2020-09-20 ENCOUNTER — Other Ambulatory Visit (HOSPITAL_COMMUNITY)
Admission: RE | Admit: 2020-09-20 | Discharge: 2020-09-20 | Disposition: A | Discharge status: Home / Self Care | Payer: Federal, State, Local not specified - PPO | Source: Ambulatory Visit | Attending: Family Medicine | Admitting: Family Medicine

## 2020-09-20 DIAGNOSIS — E041 Nontoxic single thyroid nodule: Secondary | ICD-10-CM

## 2020-09-20 DIAGNOSIS — E042 Nontoxic multinodular goiter: Secondary | ICD-10-CM | POA: Diagnosis not present

## 2020-09-21 LAB — CYTOLOGY - NON PAP

## 2020-09-27 ENCOUNTER — Other Ambulatory Visit: Payer: Self-pay | Admitting: Family Medicine

## 2020-09-27 DIAGNOSIS — E042 Nontoxic multinodular goiter: Secondary | ICD-10-CM

## 2020-09-29 NOTE — Progress Notes (Signed)
I, Wendy Poet, LAT, ATC, am serving as scribe for Dr. Lynne Leader.  Michele [name] is a 45 y.o. female who presents to [facility] at [facility] today for R knee pain x approximately one week that worsened last Thursday. Pt was previously seen by Dr. Tamala Julian on 06/06/20 for chronic neck and LBP. Prior to that, she was seen in July 2021 for R knee pain by Dr. Tamala Julian.  Of note, pt enjoys walking daily for exercise and walks 2-3 miles/day.  Pt locates pain to both her R medial and lateral knee.  R knee swelling: some but not as bad previously R knee mechanical symptoms: intermittently Aggravates: transitioning from sit-to-stand; and stand-to-stand Treatments tried: Tru-Pull lite knee brace; ice; Tylenol  Dx imaging: 06/23/20 L-spine MRI  06/09/19 L-spine XR  02/03/13 L-spine XR  Pertinent review of systems: No fevers or chills  Relevant historical information: Hypertension   Exam:  BP 120/78 (BP Location: Right Arm, Patient Position: Sitting, Cuff Size: Normal)   Pulse 77   Ht 5' 5.5" (1.664 m)   Wt 229 lb 9.6 oz (104.1 kg)   SpO2 98%   BMI 37.63 kg/m  General: Well Developed, well nourished, and in no acute distress.   MSK: Right knee decreased quad bulk otherwise normal. Normal motion with crepitation. Tender palpation anterior knee mildly.  Palpable squeak overlying patella. Intact strength. Stable ligamentous exam. Negative McMurray's test.    Lab and Radiology Results  X-ray images right knee obtained today personally and independently interpreted. Loose body/calcification superior lateral knee and posterior knee. Possible OCD medial femoral condyle.  Medial compartment DJD. Await radiology overread  Diagnostic Limited MSK Ultrasound of: Right knee Quad tendon intact normal appearing.  Loose body/calcification not visualized on ultrasound. Patellar tendon normal-appearing Lateral joint line normal-appearing Medial joint line narrowed degenerative  appearing Posterior knee no Baker's cyst. Impression: Medial compartment DJD   Assessment and Plan: 45 y.o. female with right knee pain thought primarily due to patellofemoral pain syndrome and quadricep atrophy during the visit.  X-rays obtained at the end of the visit.  Patient may have more than originally was thought during the visit based on x-ray.  Await radiology overread.  Plan for quad strengthening and Voltaren gel and recheck in 6 weeks or so.  However based on x-ray appearance may be proceeding with MRI sooner than originally thought.   PDMP not reviewed this encounter. Orders Placed This Encounter  Procedures  . [place] LIMITED JOINT SPACE STRUCTURES LOW RIGHT(NO LINKED CHARGES)    Order Specific Question:   Reason for Exam (SYMPTOM  OR DIAGNOSIS REQUIRED)    Answer:   R knee pain    Order Specific Question:   Preferred imaging location?    Answer:   [facility]  . DG Knee AP/LAT W/[place] Right    Standing Status:   Future    Number of Occurrences:   1    Standing Expiration Date:   10/02/2021    Order Specific Question:   Reason for Exam (SYMPTOM  OR DIAGNOSIS REQUIRED)    Answer:   eval knee pain    Order Specific Question:   Is patient pregnant?    Answer:   No    Order Specific Question:   Preferred imaging location?    Answer:   [facility]   No orders of the defined types were placed in this encounter.    Discussed warning signs or symptoms. Please see discharge instructions.  Patient expresses understanding.   The above documentation has been reviewed and is accurate and complete Lynne Leader, M.D.

## 2020-10-02 ENCOUNTER — Ambulatory Visit (INDEPENDENT_AMBULATORY_CARE_PROVIDER_SITE_OTHER): Payer: Federal, State, Local not specified - PPO

## 2020-10-02 ENCOUNTER — Ambulatory Visit: Payer: Federal, State, Local not specified - PPO | Admitting: Family Medicine

## 2020-10-02 ENCOUNTER — Encounter: Payer: Self-pay | Admitting: Family Medicine

## 2020-10-02 ENCOUNTER — Ambulatory Visit: Payer: Self-pay

## 2020-10-02 ENCOUNTER — Other Ambulatory Visit: Payer: Self-pay

## 2020-10-02 VITALS — BP 120/78 | HR 77 | Ht 65.5 in | Wt 229.6 lb

## 2020-10-02 DIAGNOSIS — M25561 Pain in right knee: Secondary | ICD-10-CM | POA: Diagnosis not present

## 2020-10-02 DIAGNOSIS — M1711 Unilateral primary osteoarthritis, right knee: Secondary | ICD-10-CM | POA: Diagnosis not present

## 2020-10-02 NOTE — Patient Instructions (Addendum)
Thank you for coming in today.  Please use Voltaren gel (Generic Diclofenac Gel) up to 4x daily for pain as needed.  This is available over-the-counter as both the name brand Voltaren gel and the generic diclofenac gel.  Work hard on that [employer].   Please perform the exercise program that we have prepared for you and gone over in detail on a daily basis.  In addition to the handout you were provided you can access your program through: [email or web address]   Your unique program code is:  [id number]  Patellofemoral pain syndrome.   If you are not improving let me know.  Physical therapy would help a lot.   I can also do an injection at any time.   Recheck as in 6 weeks or as needed.    Patellofemoral Pain Syndrome  Patellofemoral pain syndrome is a condition in which the tissue (cartilage) on the underside of the kneecap (patella) softens or breaks down. This causes pain in the front of the knee. The condition is also called runner's knee or chondromalacia patella. Patellofemoral pain syndrome is most common in young adults who are active in sports. The knee is the largest joint in the body. The patella covers the front of the knee and is attached to muscles above and below the knee. The underside of the patella is covered with a smooth type of cartilage (synovium). The smooth surface helps the patella glide easily when you move your knee. Patellofemoral pain syndrome causes swelling in the joint linings and bone surfaces in the knee. What are the causes? This condition may be caused by:  Overuse of the knee.  Poor alignment of your knee joints.  Weak leg muscles.  A direct hit to your kneecap. What increases the risk? You are more likely to develop this condition if:  You do a lot of activities that can wear down your kneecap. These include: ? Running. ? Squatting. ? Climbing stairs.  You start a new physical activity or exercise program.  You wear shoes that do  not fit well.  You do not have good leg strength.  You are overweight. What are the signs or symptoms? The main symptom of this condition is knee pain. This may feel like a dull, aching pain underneath your patella, in the front of your knee. There may be a popping or cracking sound when you move your knee. Pain may get worse with:  Exercise.  Climbing stairs.  Running.  Jumping.  Squatting.  Kneeling.  Sitting for a long time.  Moving or pushing on your patella. How is this diagnosed? This condition may be diagnosed based on:  Your symptoms and medical history. You may be asked about your recent physical activities and which ones cause knee pain.  A physical exam. This may include: ? Moving your patella back and forth. ? Checking your range of knee motion. ? Having you squat or jump to see if you have pain. ? Checking the strength of your leg muscles.  Imaging tests to confirm the diagnosis. These may include an MRI of your knee. How is this treated? This condition may be treated at home with rest, ice, compression, and elevation (RICE).  Other treatments may include:  NSAIDs, such as ibuprofen.  Physical therapy to stretch and strengthen your leg muscles.  Shoe inserts (orthotics) to take stress off your knee.  A knee brace or knee support.  Adhesive tapes to the skin.  Surgery to remove damaged cartilage or move the  patella to a better position. This is rare. Follow these instructions at home: If you have a brace:  Wear the brace as told by your health care provider. Remove it only as told by your health care provider.  Loosen the brace if your toes tingle, become numb, or turn cold and blue.  Keep the brace clean.  If the brace is not waterproof: ? Do not let it get wet. ? Cover it with a watertight covering when you take a bath or a shower. Managing pain, stiffness, and swelling  If directed, put ice on the painful area. To do this: ? If you have  a removable brace, remove it as told by your health care provider. ? Put ice in a plastic bag. ? Place a towel between your skin and the bag. ? Leave the ice on for 20 minutes, 2-3 times a day. ? Remove the ice if your skin turns bright red. This is very important. If you cannot feel pain, heat, or cold, you have a greater risk of damage to the area.  Move your toes often to reduce stiffness and swelling.  Raise (elevate) the injured area above the level of your heart while you are sitting or lying down.   Activity  Rest your knee.  Avoid activities that cause knee pain.  Perform stretching and strengthening exercises as told by your health care provider or physical therapist.  Return to your normal activities as told by your health care provider. Ask your health care provider what activities are safe for you. General instructions  Take over-the-counter and prescription medicines only as told by your health care provider.  Use splints, braces, knee supports, or walking aids as directed by your health care provider.  Do not use any products that contain nicotine or tobacco, such as cigarettes, e-cigarettes, and chewing tobacco. These can delay healing. If you need help quitting, ask your health care provider.  Keep all follow-up visits. This is important. Contact a health care provider if:  Your symptoms get worse.  You are not improving with home care. Summary  Patellofemoral pain syndrome is a condition in which the tissue (cartilage) on the underside of the kneecap (patella) softens or breaks down.  This condition causes swelling in the joint linings and bone surfaces in the knee. This leads to pain in the front of the knee.  This condition may be treated at home with rest, ice, compression, and elevation (RICE).  Use splints, braces, knee supports, or walking aids as directed by your health care provider. This information is not intended to replace advice given to you by  your health care provider. Make sure you discuss any questions you have with your health care provider. Document Revised: 09/29/2019 Document Reviewed: 09/29/2019 Elsevier Patient Education  2021 [employer].

## 2020-10-04 ENCOUNTER — Telehealth: Payer: Self-pay | Admitting: Family Medicine

## 2020-10-04 DIAGNOSIS — M958 Other specified acquired deformities of musculoskeletal system: Secondary | ICD-10-CM

## 2020-10-04 DIAGNOSIS — M2341 Loose body in knee, right knee: Secondary | ICD-10-CM

## 2020-10-04 DIAGNOSIS — M25561 Pain in right knee: Secondary | ICD-10-CM

## 2020-10-04 NOTE — Telephone Encounter (Signed)
Knee MRI ordered 

## 2020-10-04 NOTE — Progress Notes (Signed)
X-ray right knee shows an area in the inside part of the knee where it looks like a chunk of cartilage and bone has been knocked loose.  This probably happened years ago.  There also is a chunk of cartilage and bone that has migrated above the kneecap.  I suspect the piece of cartilage and bone that we can see above the kneecap probably came from inside the knee joint.  We also see arthritis in the knee.  I recommend that we proceed to MRI to further evaluate this.  I have ordered an MRI already.  You should hear about scheduling the MRI in the near future.  The name of the condition specifically is a osteochondral defect and loose body.

## 2020-10-07 ENCOUNTER — Other Ambulatory Visit: Payer: Self-pay | Admitting: Family Medicine

## 2020-10-08 ENCOUNTER — Other Ambulatory Visit: Payer: Self-pay

## 2020-10-08 ENCOUNTER — Ambulatory Visit (INDEPENDENT_AMBULATORY_CARE_PROVIDER_SITE_OTHER): Payer: Federal, State, Local not specified - PPO

## 2020-10-08 DIAGNOSIS — M2341 Loose body in knee, right knee: Secondary | ICD-10-CM

## 2020-10-08 DIAGNOSIS — M25561 Pain in right knee: Secondary | ICD-10-CM

## 2020-10-08 DIAGNOSIS — M958 Other specified acquired deformities of musculoskeletal system: Secondary | ICD-10-CM

## 2020-10-10 NOTE — Progress Notes (Signed)
MRI right knee shows a 1.5 cm x 1 cm osteochondral defect.  This is a chunk of bone and cartilage that has been knocked loose from the weightbearing portion of the knee and is now floating around inside the knee joint.  This is probably causing your pain.  This could be treated surgically.  Recommend return to clinic to go over the results of full detail and discuss treatment plan and options.  Very likely I would refer to orthopedic surgery.

## 2020-10-10 NOTE — Progress Notes (Signed)
Pt viewed Dr. Clovis Riley result note via [place] and scheduled a f/u visit for 10/12/20.

## 2020-10-11 NOTE — Progress Notes (Signed)
I, Wendy Poet, LAT, ATC, am serving as scribe for Dr. Lynne Leader.  Michele [name] is a 45 y.o. female who presents to [place] at [facility] today for f/u R knee pain and MRI review. Pt was last seen by Dr. Georgina Snell on 10/02/20 and advised to work on quad strengthening and use Voltaren gel. Today, pt reports that she hasn't been having as many flare-ups in her knee pain.  She has been wearing her knee brace.  She states that she resumed walking for exercise on Monday w/ no issues to date.  Dx imaging: 10/08/20 R knee MRI  10/02/20 R knee XR  Pertinent review of systems: No fevers or chills  Relevant historical information: Hypertension   Exam:  BP 110/78 (BP Location: Right Arm, Patient Position: Sitting, Cuff Size: Normal)   Pulse 74   Ht 5' 5.5" (1.664 m)   Wt 227 lb 9.6 oz (103.2 kg)   SpO2 98%   BMI 37.30 kg/m  General: Well Developed, well nourished, and in no acute distress.   MSK: Right knee normal motion    Lab and Radiology Results No results found for this or any previous visit (from the past 72 hour(s)). MR Knee Right Wo Contrast  Result Date: 10/09/2020 CLINICAL DATA:  Chronic right knee pain and swelling since last July. No injury or prior surgery. EXAM: MRI OF THE RIGHT KNEE WITHOUT CONTRAST TECHNIQUE: Multiplanar, multisequence MR imaging of the knee was performed. No intravenous contrast was administered. COMPARISON:  Right knee x-rays dated October 02, 2020. FINDINGS: MENISCI Medial meniscus:  Intact. Lateral meniscus: Focal free edge fraying of the midbody. Otherwise intact. LIGAMENTS Cruciates:  Intact ACL and PCL. Collaterals: Medial collateral ligament is intact. Lateral collateral ligament complex is intact. CARTILAGE Patellofemoral: Focal high-grade partial-thickness cartilage loss over the trochlear groove. Medial: Mild diffuse cartilage thinning. 1.5 cm AP x 1.0 cm TR osteochondral defect of the mesial central medial femoral condyle. Lateral:   No chondral defect. Joint: Trace joint effusion. Displaced osteochondral fragment in the suprapatellar joint space (series 3, image 8). Normal Hoffa's fat. Popliteal Fossa:  Trace Baker cyst.  Intact popliteus tendon. Extensor Mechanism: Intact quadriceps tendon and patellar tendon. Intact medial and lateral patellar retinaculum. Intact MPFL. Bones: No acute fracture or dislocation. No suspicious bone lesion. Small tricompartmental marginal osteophytes. Other: None. IMPRESSION: 1. 1.5 cm AP x 1.0 cm TR osteochondral defect of the mesial central medial femoral condyle with the displaced fragment in the suprapatellar joint space. 2. Focal free edge fraying of the lateral meniscus midbody. No discrete meniscal tear. 3. Mild tricompartmental osteoarthritis. Electronically Signed   By: Titus Dubin M.D.   On: 10/09/2020 10:08   I, Lynne Leader, personally (independently) visualized and performed the interpretation of the images attached in this note.     Assessment and Plan: 45 y.o. female with right knee pain due to osteochondral lesion medial femoral condyle with loose body.  This is the fundamental cause of her knee pain and dysfunction.  I think at times loose body gets in the way of her knee joint and causes an acute problem but fundamentally she would develop worsening chronic pain because of the osteochondral defect of the medial femoral condyle.  Fundamentally this may be a surgical problem with surgical solution.  I think she should have a consultation with orthopedic surgery to discuss her options.  She may benefit from OATS procedure or even a partial knee replacement.   If she chooses to  approach this conservatively or if orthopedic surgery recommends conservative management and I think hyaluronic acid injection and medial off loader brace would be reasonable.  Happy to pursue those with her in the future.  Discussed options with patient and discussed MRI findings. Total encounter time 30 minutes  including face-to-face time with the patient and, reviewing past medical record, and charting on the date of service.      PDMP not reviewed this encounter. Orders Placed This Encounter  Procedures   Ambulatory referral to Orthopedic Surgery    Referral Priority:   Routine    Referral Type:   Surgical    Referral Reason:   Specialty Services Required    Requested Specialty:   Orthopedic Surgery    Number of Visits Requested:   1   No orders of the defined types were placed in this encounter.    Discussed warning signs or symptoms. Please see discharge instructions. Patient expresses understanding.   The above documentation has been reviewed and is accurate and complete Lynne Leader, M.D.

## 2020-10-12 ENCOUNTER — Encounter: Payer: Self-pay | Admitting: Family Medicine

## 2020-10-12 ENCOUNTER — Ambulatory Visit: Payer: Federal, State, Local not specified - PPO | Admitting: Family Medicine

## 2020-10-12 ENCOUNTER — Other Ambulatory Visit: Payer: Self-pay

## 2020-10-12 VITALS — BP 110/78 | HR 74 | Ht 65.5 in | Wt 227.6 lb

## 2020-10-12 DIAGNOSIS — M958 Other specified acquired deformities of musculoskeletal system: Secondary | ICD-10-CM

## 2020-10-12 DIAGNOSIS — M2341 Loose body in knee, right knee: Secondary | ICD-10-CM

## 2020-10-12 DIAGNOSIS — M25561 Pain in right knee: Secondary | ICD-10-CM | POA: Diagnosis not present

## 2020-10-12 NOTE — Patient Instructions (Signed)
Thank you for coming in today.    

## 2020-10-25 ENCOUNTER — Ambulatory Visit (INDEPENDENT_AMBULATORY_CARE_PROVIDER_SITE_OTHER): Payer: Federal, State, Local not specified - PPO | Admitting: Orthopedic Surgery

## 2020-10-25 ENCOUNTER — Other Ambulatory Visit: Payer: Self-pay

## 2020-10-25 DIAGNOSIS — M93261 Osteochondritis dissecans, right knee: Secondary | ICD-10-CM

## 2020-10-30 ENCOUNTER — Encounter: Payer: Self-pay | Admitting: Orthopedic Surgery

## 2020-10-30 NOTE — Progress Notes (Signed)
Office Visit Note   Patient: Michele [name]           Date of Birth: 15-Nov-1975           MRN: [id number] Visit Date: 10/25/2020 Requested by: Gregor Hams, MD [place],  [place] [address] PCP: Caren Macadam, MD  Subjective: Chief Complaint  Patient presents with   Right Knee - Pain    HPI: Patient presents for evaluation of right knee pain.  Started having some mild knee pain last July but worsening symptoms over the last month.  She has to teenagers in the band and would ideally like to put off any type of surgical procedure until around Thanksgiving.  She likes to walk 3 miles with brace on a daily basis.  Has really only had 1 episode of knee locking.  MRI scan is reviewed and she does have an osteochondral defect on the medial femoral condyle with loose body floating in the suprapatellar pouch.  No other significant abnormalities present in the patellofemoral joint or lateral compartment.  Very mild degenerative changes present in the medial compartment with intact medial meniscus.              ROS: All systems reviewed are negative as they relate to the chief complaint within the history of present illness.  Patient denies  fevers or chills.   Assessment & Plan: Visit Diagnoses:  1. Osteochondritis dissecans of knee, right     Plan: Impression is osteochondral defect in the medial femoral condyle measuring about 1-1/2 x 1/2 cm with probable bigger area of chondral degradation present.  The bony fragment is a true osteochondral fragment with both cartilage and bone present..  Ideal scenario would be treatment of this like a nonunion with fixation and bone grafting of the fragment back into the crater bed.  The timing is not great for the patient for that option.  Alternatively if that is not possible then we would have to consider osteochondral allografting for this defect.  This is an uncontained defect and it would be difficult to perform osteochondral  autografting of bone plugs harvested from her lateral femoral condyle.  Based on the patient's timing we will see her back in August at the end of the month to Potentially get her in line for osteochondral allograft.  Surgical plan at the time of the procedure would be attempt at fragment fixation if that still possible.  Second treatment option at the time of surgery would be osteochondral allografting.  In my experience the longer the fragment floats in the knee the smaller it gets and fixation becomes less plausible.  The donor site also tends to get larger.  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  Follow-Up Instructions: Return for Return to clinic end of August.   Orders:  No orders of the defined types were placed in this encounter.  No orders of the defined types were placed in this encounter.     Procedures: No procedures performed   Clinical Data: No additional findings.  Objective: Vital Signs: There were no vitals taken for this visit.  Physical Exam:   Constitutional: Patient appears well-developed HEENT:  Head: Normocephalic Eyes:EOM are normal Neck: Normal range of motion Cardiovascular: Normal rate Pulmonary/chest: Effort normal Neurologic: Patient is alert Skin: Skin is warm Psychiatric: Patient has normal mood and affect   Ortho Exam: Ortho exam demonstrates full  range of motion of the right knee with no effusion.  Collateral and cruciate ligaments are stable.  No masses lymphadenopathy or skin changes noted in that right knee region.  No groin pain with internal ex rotation of the leg.  Specialty Comments:  No specialty comments available.  Imaging: No results found.   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   Nonallopathic lesion of lumbar region 09/09/2019   Nonallopathic lesion  of sacral region 09/09/2019   Nonallopathic lesion of thoracic region 09/09/2019   Nonallopathic lesion of rib cage 09/09/2019   Nonallopathic lesion of cervical region 09/09/2019   Acute bursitis of left shoulder 09/09/2019   Cholecystitis, acute with cholelithiasis 05/09/2016   Gynecomastia, female 02/03/2013   Back pain, acute 06/22/2012   Obesity 05/25/2010   Essential hypertension 05/25/2010   Past Medical History:  Diagnosis Date   Diabetes mellitus without complication ([place])    Hypertension    under control with med., has been on med. x 2 yr.   Hypertrophy of breast 03/2013   Obesity     Family History  Problem Relation Age of Onset   Cancer Father 42       Prostate   Heart disease Father        bypass   Heart attack Father 8   Hypertension Father    Migraines Mother    Arthritis Mother    Deep vein thrombosis Mother        started after ortho surgery; lifetime   Hyperlipidemia Mother    Cancer Paternal Grandmother        blood?   Cancer Paternal Grandfather        prostate   Headache Brother    Other Brother        ?something with heart; under eval   Glaucoma Maternal Grandmother    Head & neck cancer Maternal Grandmother    Alzheimer's disease Maternal Grandfather    Heart attack Paternal Uncle 51    Past Surgical History:  Procedure Laterality Date   ABDOMINAL HYSTERECTOMY N/A 04/13/2020   Procedure: HYSTERECTOMY ABDOMINAL;  Surgeon: Delsa Bern, MD;  Location: [facility];  Service: Gynecology;  Laterality: N/A;  AUTO INFUSION PROTOCOL   BILATERAL SALPINGECTOMY Bilateral 04/13/2020   Procedure: OPEN BILATERAL SALPINGECTOMY;  Surgeon: Delsa Bern, MD;  Location: [facility];  Service: Gynecology;  Laterality: Bilateral;   BREAST REDUCTION SURGERY Bilateral 04/26/2013   Procedure: BILATERAL BREAST REDUCTION WITH LIPOSUCTION;  Surgeon: Cristine Polio, MD;  Location: [facility];  Service: Plastics;  Laterality: Bilateral;   CHOLECYSTECTOMY N/A  05/10/2016   Procedure: LAPAROSCOPIC CHOLECYSTECTOMY WITH INTRAOPERATIVE CHOLANGIOGRAM;  Surgeon: Donnie Mesa, MD;  Location: [facility];  Service: General;  Laterality: N/A;   FOOT SURGERY  2003; 2004   each foot - bunion, hammer toe   Social History   Occupational History   Not on file  Tobacco Use   Smoking status: Never   Smokeless tobacco: Never  Vaping Use   Vaping Use: Never used  Substance and Sexual Activity   Alcohol use: Yes    Comment: rarely   Drug use: No   Sexual activity: Yes    Birth control/protection: I.U.D.

## 2020-11-13 ENCOUNTER — Ambulatory Visit: Payer: Federal, State, Local not specified - PPO | Admitting: Family Medicine

## 2020-12-13 ENCOUNTER — Telehealth: Payer: Self-pay

## 2020-12-13 NOTE — Telephone Encounter (Signed)
Spoke with the patient and she asked what she could use for a cough and sinus drainage, states her daughter tested positive for Covid weeks ago and her test was negative.  I scheduled a virtual visit on 8/18 with Dr Maudie Mercury as PCP does not have any openings.

## 2020-12-13 NOTE — Telephone Encounter (Signed)
Message sent to Dr Ulice Brilliant CMA for advise

## 2020-12-14 ENCOUNTER — Encounter: Payer: Self-pay | Admitting: Family Medicine

## 2020-12-14 ENCOUNTER — Telehealth (INDEPENDENT_AMBULATORY_CARE_PROVIDER_SITE_OTHER): Payer: Federal, State, Local not specified - PPO | Admitting: Family Medicine

## 2020-12-14 DIAGNOSIS — R059 Cough, unspecified: Secondary | ICD-10-CM

## 2020-12-14 MED ORDER — BENZONATATE 200 MG PO CAPS: 200.0000 mg | ORAL_CAPSULE | Freq: Two times a day (BID) | ORAL | 0 refills | Status: DC | PRN

## 2020-12-14 NOTE — Progress Notes (Signed)
Virtual Visit via Video Note  I connected with Tiyah  on 12/14/20 at 10:20 AM EDT by a video enabled telemedicine application and verified that I am speaking with the correct person using two identifiers.  Location patient: home, [place] Location provider:work or home office Persons participating in the virtual visit: patient, provider  I discussed the limitations of evaluation and management by telemedicine and the availability of in person appointments. The patient expressed understanding and agreed to proceed.   HPI:  Acute telemedicine visit for Cough: -Onset: about 5 days ago, she tested negative for covid -Daughter was sick with covid august 5th for a few days,  husband had symptoms as well but tested negative many times so they felt that he had something else and she thinks she got what he had -feeling ok at this point other than a cough -Symptoms include: cough, sinus drainage, sore throat -Denies:fever, NVD, CP, SOB, inability to eat/drink/get out of bed -Has tried: tussin otc -Pertinent past medical history:see below -Pertinent medication allergies: No Known Allergies -COVID-19 vaccine status:vaccinated and boosted -denies any chance of pregnancy currently, s/p hysterectomy -GFR >100 on labs in may  ROS: See pertinent positives and negatives per HPI.  Past Medical History:  Diagnosis Date   Diabetes mellitus without complication ([place])    Hypertension    under control with med., has been on med. x 2 yr.   Hypertrophy of breast 03/2013   Obesity     Past Surgical History:  Procedure Laterality Date   ABDOMINAL HYSTERECTOMY N/A 04/13/2020   Procedure: HYSTERECTOMY ABDOMINAL;  Surgeon: Delsa Bern, MD;  Location: [facility];  Service: Gynecology;  Laterality: N/A;  AUTO INFUSION PROTOCOL   BILATERAL SALPINGECTOMY Bilateral 04/13/2020   Procedure: OPEN BILATERAL SALPINGECTOMY;  Surgeon: Delsa Bern, MD;  Location: [facility];  Service: Gynecology;  Laterality: Bilateral;    BREAST REDUCTION SURGERY Bilateral 04/26/2013   Procedure: BILATERAL BREAST REDUCTION WITH LIPOSUCTION;  Surgeon: Cristine Polio, MD;  Location: [facility];  Service: Plastics;  Laterality: Bilateral;   CHOLECYSTECTOMY N/A 05/10/2016   Procedure: LAPAROSCOPIC CHOLECYSTECTOMY WITH INTRAOPERATIVE CHOLANGIOGRAM;  Surgeon: Donnie Mesa, MD;  Location: [facility];  Service: General;  Laterality: N/A;   FOOT SURGERY  2003; 2004   each foot - bunion, hammer toe     Current Outpatient Medications:    blood glucose meter kit and supplies KIT, Dispense based on patient and insurance preference. Use up to four times daily as directed. (FOR ICD-9 250.00, 250.01)., Disp: 1 each, Rfl: 0   chlorhexidine (PERIDEX) 0.12 % solution, Use as directed 15 mLs in the mouth or throat daily as needed (gum irritation)., Disp: , Rfl:    Cholecalciferol (VITAMIN D) 50 MCG (2000 UT) CAPS, Take 1 capsule (2,000 Units total) by mouth daily., Disp: 30 capsule, Rfl:    ELDERBERRY PO, Take 50 mg by mouth daily., Disp: , Rfl:    lisinopril-hydrochlorothiazide (ZESTORETIC) 20-12.5 MG tablet, TAKE 1 TABLET BY MOUTH EVERY DAY, Disp: 90 tablet, Rfl: 1   Multiple Vitamins-Minerals (MULTIVITAMIN ADULTS PO), Take by mouth daily., Disp: , Rfl:   EXAM:  VITALS per patient if applicable:  GENERAL: alert, oriented, appears well and in no acute distress  HEENT: atraumatic, conjunttiva clear, no obvious abnormalities on inspection of external nose and ears  NECK: normal movements of the head and neck  LUNGS: on inspection no signs of respiratory distress, breathing rate appears normal, no obvious gross SOB, gasping or wheezing  CV: no obvious cyanosis  MS:  moves all visible extremities without noticeable abnormality  PSYCH/NEURO: pleasant and cooperative, no obvious depression or anxiety, speech and thought processing grossly intact  ASSESSMENT AND PLAN:  Discussed the following assessment and plan:  No diagnosis  found.  -we discussed possible serious and likely etiologies, options for evaluation and workup, limitations of telemedicine visit vs in person visit, treatment, treatment risks and precautions. Pt prefers to treat via telemedicine empirically rather than in person at this moment. Query Covid19, VURI vs other. She has opted to repeat a home test. In case test for covid is positive, discussed treatment options (infusions and oral options and risk of drug interactions), ideal treatment window, potential complications, isolation and precautions for COVID-19. She has opted to contact her [place] office to have them send me a message today if the test is positive and will send rx.  Otherwise, the patient did want a prescription for cough, Tessalon Rx sent.  Other symptomatic care measures summarized in patient instructions. Work/School slipped offered: declined Scheduled follow up with PCP offered: she opted for as needed follow up  Advised to seek prompt in person care if worsening, new symptoms arise, or if is not improving with treatment. Discussed options for inperson care if PCP office not available. Did let this patient know that I only do telemedicine on Tuesdays and Thursdays for [place]. Advised to schedule follow up visit with PCP or [facility] if any further questions or concerns to avoid delays in care.   I discussed the assessment and treatment plan with the patient. The patient was provided an opportunity to ask questions and all were answered. The patient agreed with the plan and demonstrated an understanding of the instructions.     Lucretia Kern, DO

## 2020-12-14 NOTE — Patient Instructions (Addendum)
  HOME CARE TIPS:  -[facility] testing information: [email or web address]/ OR [phone] Most pharmacies also offer testing and home test kits. If the Covid19 test is positive, please make a prompt follow up visit with your primary care office or with [facility] to discuss treatment options. Treatments for Covid19 are best given early in the course of the illness.   -I sent the medication(s) we discussed to your pharmacy: Meds ordered this encounter  Medications   benzonatate (TESSALON) 200 MG capsule    Sig: Take 1 capsule (200 mg total) by mouth 2 (two) times daily as needed for cough.    Dispense:  20 capsule    Refill:  0     -can use nasal saline a few times per day if you have nasal congestion; sometimes  a short course of Afrin nasal spray for 3 days can help with symptoms as well  -stay hydrated, drink plenty of fluids and eat small healthy meals - avoid dairy  -can take 1000 IU (3mg) Vit D3 and 100-500 mg of Vit C daily per instructions  -If the Covid test is positive, [facility] for more information on home care, transmission and treatment for COVID19  -follow up with your doctor in 2-3 days unless improving and feeling better  -stay home while sick, except to seek medical care. If you have COVID19, ideally it would be best to stay home for a full 10 days since the onset of symptoms PLUS one day of no fever and feeling better. Wear a good mask that fits snugly (such as N95 or KN95) if around others to reduce the risk of transmission.  It was nice to meet you today, and I really hope you are feeling better soon. I help [facility] out with telemedicine visits on Tuesdays and Thursdays and am available for visits on those days. If you have any concerns or questions following this visit please schedule a follow up visit with your Primary Care doctor or seek care at a local urgent care clinic to avoid delays in care.     Seek in person care or schedule a follow up video visit promptly if your symptoms worsen, new concerns arise or you are not improving with treatment. Call 911 and/or seek emergency care if your symptoms are severe or life threatening.

## 2020-12-20 ENCOUNTER — Other Ambulatory Visit: Payer: Self-pay

## 2020-12-20 ENCOUNTER — Ambulatory Visit
Admission: EM | Admit: 2020-12-20 | Discharge: 2020-12-20 | Disposition: A | Discharge status: Home / Self Care | Payer: Federal, State, Local not specified - PPO | Attending: Urgent Care | Admitting: Urgent Care

## 2020-12-20 ENCOUNTER — Encounter: Payer: Self-pay | Admitting: Emergency Medicine

## 2020-12-20 DIAGNOSIS — R070 Pain in throat: Secondary | ICD-10-CM | POA: Diagnosis not present

## 2020-12-20 DIAGNOSIS — R0981 Nasal congestion: Secondary | ICD-10-CM

## 2020-12-20 DIAGNOSIS — J018 Other acute sinusitis: Secondary | ICD-10-CM | POA: Diagnosis not present

## 2020-12-20 DIAGNOSIS — R059 Cough, unspecified: Secondary | ICD-10-CM

## 2020-12-20 LAB — POCT RAPID STREP A (OFFICE): Rapid Strep A Screen: NEGATIVE

## 2020-12-20 MED ORDER — CETIRIZINE HCL 10 MG PO TABS: 10.0000 mg | ORAL_TABLET | Freq: Every day | ORAL | 0 refills | Status: DC

## 2020-12-20 MED ORDER — AMOXICILLIN 500 MG PO CAPS: 500.0000 mg | ORAL_CAPSULE | Freq: Three times a day (TID) | ORAL | 0 refills | Status: DC

## 2020-12-20 MED ORDER — PSEUDOEPHEDRINE HCL 30 MG PO TABS: 30.0000 mg | ORAL_TABLET | Freq: Three times a day (TID) | ORAL | 0 refills | Status: DC | PRN

## 2020-12-20 NOTE — ED Triage Notes (Signed)
Negative covid test with PCP last week. C/o sore throat, nasal congestion, cough x 1 week. Denies fever. Daughter had covid at beginning of month.

## 2020-12-20 NOTE — ED Provider Notes (Signed)
[place]   MRN: [id number] DOB: 08-Jan-1976  Subjective:   Michele [name] is a 45 y.o. female presenting for 1 week history of persistent cough, sore throat, sinus congestion, painful swallowing, sinus pain.  Patient already had a COVID test and was negative.  She did have exposure with her daughter at the beginning of the month.  Denies history of allergic rhinitis, asthma.  She is not a smoker.  She has type 2 diabetes treated without insulin.  No current facility-administered medications for this encounter.  Current Outpatient Medications:    benzonatate (TESSALON) 200 MG capsule, Take 1 capsule (200 mg total) by mouth 2 (two) times daily as needed for cough., Disp: 20 capsule, Rfl: 0   blood glucose meter kit and supplies KIT, Dispense based on patient and insurance preference. Use up to four times daily as directed. (FOR ICD-9 250.00, 250.01)., Disp: 1 each, Rfl: 0   chlorhexidine (PERIDEX) 0.12 % solution, Use as directed 15 mLs in the mouth or throat daily as needed (gum irritation)., Disp: , Rfl:    Cholecalciferol (VITAMIN D) 50 MCG (2000 UT) CAPS, Take 1 capsule (2,000 Units total) by mouth daily., Disp: 30 capsule, Rfl:    ELDERBERRY PO, Take 50 mg by mouth daily., Disp: , Rfl:    lisinopril-hydrochlorothiazide (ZESTORETIC) 20-12.5 MG tablet, TAKE 1 TABLET BY MOUTH EVERY DAY, Disp: 90 tablet, Rfl: 1   Multiple Vitamins-Minerals (MULTIVITAMIN ADULTS PO), Take by mouth daily., Disp: , Rfl:    No Known Allergies  Past Medical History:  Diagnosis Date   Diabetes mellitus without complication ([place])    Hypertension    under control with med., has been on med. x 2 yr.   Hypertrophy of breast 03/2013   Obesity      Past Surgical History:  Procedure Laterality Date   ABDOMINAL HYSTERECTOMY N/A 04/13/2020   Procedure: HYSTERECTOMY ABDOMINAL;  Surgeon: Delsa Bern, MD;  Location: [facility];  Service: Gynecology;  Laterality: N/A;  AUTO INFUSION PROTOCOL    BILATERAL SALPINGECTOMY Bilateral 04/13/2020   Procedure: OPEN BILATERAL SALPINGECTOMY;  Surgeon: Delsa Bern, MD;  Location: [facility];  Service: Gynecology;  Laterality: Bilateral;   BREAST REDUCTION SURGERY Bilateral 04/26/2013   Procedure: BILATERAL BREAST REDUCTION WITH LIPOSUCTION;  Surgeon: Cristine Polio, MD;  Location: [facility];  Service: Plastics;  Laterality: Bilateral;   CHOLECYSTECTOMY N/A 05/10/2016   Procedure: LAPAROSCOPIC CHOLECYSTECTOMY WITH INTRAOPERATIVE CHOLANGIOGRAM;  Surgeon: Donnie Mesa, MD;  Location: [facility];  Service: General;  Laterality: N/A;   FOOT SURGERY  2003; 2004   each foot - bunion, hammer toe    Family History  Problem Relation Age of Onset   Cancer Father 2       Prostate   Heart disease Father        bypass   Heart attack Father 64   Hypertension Father    Migraines Mother    Arthritis Mother    Deep vein thrombosis Mother        started after ortho surgery; lifetime   Hyperlipidemia Mother    Cancer Paternal Grandmother        blood?   Cancer Paternal Grandfather        prostate   Headache Brother    Other Brother        ?something with heart; under eval   Glaucoma Maternal Grandmother    Head & neck cancer Maternal Grandmother    Alzheimer's disease Maternal Grandfather    Heart attack Paternal  Uncle 72    Social History   Tobacco Use   Smoking status: Never   Smokeless tobacco: Never  Vaping Use   Vaping Use: Never used  Substance Use Topics   Alcohol use: Yes    Comment: rarely   Drug use: No    ROS   Objective:   Vitals: BP (!) 149/92 (BP Location: Left Arm)   Pulse 80   Temp 98.8 F (37.1 C) (Oral)   Resp 16   SpO2 96%   Physical Exam Constitutional:      General: She is not in acute distress.    Appearance: Normal appearance. She is well-developed. She is not ill-appearing, toxic-appearing or diaphoretic.  HENT:     Head: Normocephalic and atraumatic.     Right Ear: Tympanic membrane  and ear canal normal. No drainage or tenderness. No middle ear effusion. Tympanic membrane is not erythematous.     Left Ear: Tympanic membrane and ear canal normal. No drainage or tenderness.  No middle ear effusion. Tympanic membrane is not erythematous.     Nose: Congestion and rhinorrhea present.     Mouth/Throat:     Mouth: Mucous membranes are moist. No oral lesions.     Pharynx: No pharyngeal swelling, oropharyngeal exudate, posterior oropharyngeal erythema or uvula swelling.     Tonsils: No tonsillar exudate or tonsillar abscesses.  Eyes:     Extraocular Movements: Extraocular movements intact.     Right eye: Normal extraocular motion.     Left eye: Normal extraocular motion.     Conjunctiva/sclera: Conjunctivae normal.     Pupils: Pupils are equal, round, and reactive to light.  Cardiovascular:     Rate and Rhythm: Normal rate and regular rhythm.     Pulses: Normal pulses.     Heart sounds: Normal heart sounds. No murmur heard.   No friction rub. No gallop.  Pulmonary:     Effort: Pulmonary effort is normal. No respiratory distress.     Breath sounds: Normal breath sounds. No stridor. No wheezing, rhonchi or rales.  Musculoskeletal:     Cervical back: Normal range of motion and neck supple.  Lymphadenopathy:     Cervical: No cervical adenopathy.  Skin:    General: Skin is warm and dry.     Findings: No rash.  Neurological:     General: No focal deficit present.     Mental Status: She is alert and oriented to person, place, and time.  Psychiatric:        Mood and Affect: Mood normal.        Behavior: Behavior normal.        Thought Content: Thought content normal.    Results for orders placed or performed during the hospital encounter of 12/20/20 (from the past 24 hour(s))  POCT rapid strep A     Status: None   Collection Time: 12/20/20  3:39 PM  Result Value Ref Range   Rapid Strep A Screen Negative Negative    Assessment and Plan :   PDMP not reviewed this  encounter.  1. Acute non-recurrent sinusitis of other sinus   2. Throat pain   3. Sinus congestion   4. Cough     Will start empiric treatment for sinusitis with amoxicillin.  Recommended supportive care otherwise including the use of oral antihistamine, decongestant. Counseled patient on potential for adverse effects with medications prescribed/recommended today, ER and return-to-clinic precautions discussed, patient verbalized understanding.    Jaynee Eagles, [place] 12/20/20 [phone]

## 2020-12-23 LAB — CULTURE, GROUP A STREP (THRC)

## 2020-12-25 ENCOUNTER — Ambulatory Visit (INDEPENDENT_AMBULATORY_CARE_PROVIDER_SITE_OTHER): Payer: Federal, State, Local not specified - PPO | Admitting: Orthopedic Surgery

## 2020-12-25 ENCOUNTER — Encounter: Payer: Self-pay | Admitting: Orthopedic Surgery

## 2020-12-25 ENCOUNTER — Other Ambulatory Visit: Payer: Self-pay

## 2020-12-25 DIAGNOSIS — M93261 Osteochondritis dissecans, right knee: Secondary | ICD-10-CM

## 2020-12-29 ENCOUNTER — Other Ambulatory Visit: Payer: Self-pay

## 2020-12-29 ENCOUNTER — Ambulatory Visit (INDEPENDENT_AMBULATORY_CARE_PROVIDER_SITE_OTHER): Payer: Federal, State, Local not specified - PPO | Admitting: Endocrinology

## 2020-12-29 ENCOUNTER — Encounter: Payer: Self-pay | Admitting: Endocrinology

## 2020-12-29 DIAGNOSIS — E042 Nontoxic multinodular goiter: Secondary | ICD-10-CM | POA: Diagnosis not present

## 2020-12-29 NOTE — Patient Instructions (Addendum)
No further thyroid testing or treatment is needed now.  Please come back for a follow-up appointment in 8 months.   We'll plan to recheck the ultrasound and blood next year.

## 2020-12-29 NOTE — Progress Notes (Signed)
Subjective:    Patient ID: Michele [name], female    DOB: December 09, 1975, 45 y.o.   MRN: [id number]  HPI Pt is referred by Dr Ethlyn Gallery, for nodular thyroid.  Pt was noted to have a thyroid nodule in 2022.  she is unaware of ever having had thyroid problems in the past.  she has no h/o XRT or surgery to the neck.   Past Medical History:  Diagnosis Date   Diabetes mellitus without complication ([place])    Hypertension    under control with med., has been on med. x 2 yr.   Hypertrophy of breast 03/2013   Obesity     Past Surgical History:  Procedure Laterality Date   ABDOMINAL HYSTERECTOMY N/A 04/13/2020   Procedure: HYSTERECTOMY ABDOMINAL;  Surgeon: Delsa Bern, MD;  Location: [facility];  Service: Gynecology;  Laterality: N/A;  AUTO INFUSION PROTOCOL   BILATERAL SALPINGECTOMY Bilateral 04/13/2020   Procedure: OPEN BILATERAL SALPINGECTOMY;  Surgeon: Delsa Bern, MD;  Location: [facility];  Service: Gynecology;  Laterality: Bilateral;   BREAST REDUCTION SURGERY Bilateral 04/26/2013   Procedure: BILATERAL BREAST REDUCTION WITH LIPOSUCTION;  Surgeon: Cristine Polio, MD;  Location: [facility];  Service: Plastics;  Laterality: Bilateral;   CHOLECYSTECTOMY N/A 05/10/2016   Procedure: LAPAROSCOPIC CHOLECYSTECTOMY WITH INTRAOPERATIVE CHOLANGIOGRAM;  Surgeon: Donnie Mesa, MD;  Location: [facility];  Service: General;  Laterality: N/A;   FOOT SURGERY  2003; 2004   each foot - bunion, hammer toe    Social History   Socioeconomic History   Marital status: Married    Spouse name: Not on file   Number of children: Not on file   Years of education: Not on file   Highest education level: Not on file  Occupational History   Not on file  Tobacco Use   Smoking status: Never   Smokeless tobacco: Never  Vaping Use   Vaping Use: Never used  Substance and Sexual Activity   Alcohol use: Yes    Comment: rarely   Drug use: No   Sexual activity: Yes    Birth control/protection: I.U.D.   Other Topics Concern   Not on file  Social History Narrative   Not on file   Social Determinants of Health   Financial Resource Strain: Not on file  Food Insecurity: Not on file  Transportation Needs: Not on file  Physical Activity: Not on file  Stress: Not on file  Social Connections: Not on file  Intimate Partner Violence: Not on file    Current Outpatient Medications on File Prior to Visit  Medication Sig Dispense Refill   amoxicillin (AMOXIL) 500 MG capsule Take 1 capsule (500 mg total) by mouth 3 (three) times daily. 21 capsule 0   benzonatate (TESSALON) 200 MG capsule Take 1 capsule (200 mg total) by mouth 2 (two) times daily as needed for cough. 20 capsule 0   blood glucose meter kit and supplies KIT Dispense based on patient and insurance preference. Use up to four times daily as directed. (FOR ICD-9 250.00, 250.01). 1 each 0   cetirizine (ZYRTEC ALLERGY) 10 MG tablet Take 1 tablet (10 mg total) by mouth daily. 30 tablet 0   chlorhexidine (PERIDEX) 0.12 % solution Use as directed 15 mLs in the mouth or throat daily as needed (gum irritation).     Cholecalciferol (VITAMIN D) 50 MCG (2000 UT) CAPS Take 1 capsule (2,000 Units total) by mouth daily. 30 capsule    ELDERBERRY PO Take 50 mg by mouth daily.  lisinopril-hydrochlorothiazide (ZESTORETIC) 20-12.5 MG tablet TAKE 1 TABLET BY MOUTH EVERY DAY 90 tablet 1   Multiple Vitamins-Minerals (MULTIVITAMIN ADULTS PO) Take by mouth daily.     pseudoephedrine (SUDAFED) 30 MG tablet Take 1 tablet (30 mg total) by mouth every 8 (eight) hours as needed for congestion. 30 tablet 0   No current facility-administered medications on file prior to visit.    No Known Allergies  Family History  Problem Relation Age of Onset   Migraines Mother    Arthritis Mother    Deep vein thrombosis Mother        started after ortho surgery; lifetime   Hyperlipidemia Mother    Cancer Father 54       Prostate   Heart disease Father        bypass    Heart attack Father 68   Hypertension Father    Headache Brother    Other Brother        ?something with heart; under eval   Glaucoma Maternal Grandmother    Head & neck cancer Maternal Grandmother    Alzheimer's disease Maternal Grandfather    Cancer Paternal Grandmother        blood?   Cancer Paternal Grandfather        prostate   Heart attack Paternal Uncle 64   Thyroid disease Neg Hx     BP 118/80 (BP Location: Right Arm, Patient Position: Sitting, Cuff Size: Large)   Pulse 94   Ht 5' 5.5" (1.664 m)   Wt 231 lb 12.8 oz (105.1 kg)   SpO2 98%   BMI 37.99 kg/m     Review of Systems Denies hoarseness, neck pain, and sob     Objective:   Physical Exam NECK: right thyroid is 3-5 times normal size, with multinod surface  Cytol of Both right nodules: benign follicular nodule (Bethesda category II)   Lab Results  Component Value Date   TSH 0.92 09/06/2020    I have reviewed outside records, and summarized: Pt was noted to have thyr nodules, and referred here.  She was seen for wellness visit, and goiter was incidentally noted.      Assessment & Plan:  MNG, new to me.  uncertain etiology and prognosis.  We discussed f/u  Patient Instructions  No further thyroid testing or treatment is needed now.  Please come back for a follow-up appointment in 8 months.   We'll plan to recheck the ultrasound and blood next year.

## 2020-12-31 DIAGNOSIS — E042 Nontoxic multinodular goiter: Secondary | ICD-10-CM | POA: Insufficient documentation

## 2020-12-31 NOTE — Progress Notes (Signed)
Office Visit Note   Patient: Michele [name]           Date of Birth: 03/04/1976           MRN: [id number] Visit Date: 12/25/2020 Requested by: Caren Macadam, MD [place],  [place] [address] PCP: Caren Macadam, MD  Subjective: Chief Complaint  Patient presents with   Right Knee - Follow-up    HPI: Michele [name] is a 45 y.o. female who returns to the office for follow-up visit.  Plan from last visit was to have her follow back up to discuss surgery for osteochondral defect of the medial femoral condyle..  Patient now returns with no lasting improvement compared with prior appointment.  Reports she was having 0/10 pain for little while but over the last several weeks she has had worsening right knee pain especially with activity.  Worse with walking long distances.  She works as an Passenger transport manager.  No recent falls or injuries since last appointment.  She is interested in pursuing surgery but she wants to make sure that she will be available for her kids while they are going through their football season this fall.  She also reports locking symptoms and feels like she can feel the osteochondral body moving around in her knee.  She has to "unlock" her knee at times due to the locking.              ROS: All systems reviewed are negative as they relate to the chief complaint within the history of present illness.  Patient denies fevers or chills.  Assessment & Plan: Visit Diagnoses:  1. Osteochondritis dissecans of knee, right     Plan: Michele [name] is a 45 y.o. female who returns to the office for follow-up visit for right knee osteochondral defect..  Plan from last visit was return for surgical discussion.  They now return with no lasting improvement in symptoms.  She has locking symptoms and effusion on exam today.  Discussed the options available to patient.  She would like to pursue surgical intervention but wants to make sure that the timing works out as  her kids have sports until the end of fall.  Her window for surgery would be from the end of November until the end of January.  She works as an Passenger transport manager and would be able to take time off work for an extended period of time.  Surgical options would mainly be between fixation of the osteochondral fragment versus osteochondral allograft.  With the extended amount of time out from the initial onset of symptoms and her prior MRI scan, it is becoming less and less likely that fixation of the osteochondral fragment will be a viable option.  Anticipate likely need for osteochondral allograft.  She will be placed on the donor list for this on February 27, 2021 and then we will contact her once there is a Orthoptist.  Discussed the risks and benefits of the procedure including the risk of nerve/vessel damage, knee stiffness, infection, medical complication from surgery such as DVT/PE, need for extended period of nonweightbearing.  After discussion of the risks and benefits as well as the recovery time frame, patient would like to proceed.  Follow-up with the office after procedure.  We will contact her once there is a match from the dinner list.  Follow-Up Instructions: No follow-ups on file.   Orders:  No orders of the defined types were placed in this encounter.  No  orders of the defined types were placed in this encounter.     Procedures: No procedures performed   Clinical Data: No additional findings.  Objective: Vital Signs: There were no vitals taken for this visit.  Physical Exam:  Constitutional: Patient appears well-developed HEENT:  Head: Normocephalic Eyes:EOM are normal Neck: Normal range of motion Cardiovascular: Normal rate Pulmonary/chest: Effort normal Neurologic: Patient is alert Skin: Skin is warm Psychiatric: Patient has normal mood and affect  Ortho Exam: Ortho exam demonstrates right knee with 0 degrees extension and 120 degrees of knee flexion.  Trace effusion present.   Tenderness to palpation over the medial joint line, especially with the knee hyperflexed.  No calf tenderness.  Negative Homans' sign.  Extensor mechanism intact.  No pain with hip range of motion.  Specialty Comments:  No specialty comments available.  Imaging: No results found.   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   Nonallopathic lesion of lumbar region 09/09/2019   Nonallopathic lesion of sacral region 09/09/2019   Nonallopathic lesion of thoracic region 09/09/2019   Nonallopathic lesion of rib cage 09/09/2019   Nonallopathic lesion of cervical region 09/09/2019   Acute bursitis of left shoulder 09/09/2019   Cholecystitis, acute with cholelithiasis 05/09/2016   Gynecomastia, female 02/03/2013   Back pain, acute 06/22/2012   Obesity 05/25/2010   Essential hypertension 05/25/2010   Past Medical History:  Diagnosis Date   Diabetes mellitus without complication ([place])    Hypertension    under control with med., has been on med. x 2 yr.   Hypertrophy of breast 03/2013   Obesity     Family History  Problem Relation Age of Onset   Migraines Mother    Arthritis Mother    Deep vein thrombosis Mother        started after ortho surgery; lifetime   Hyperlipidemia Mother    Cancer Father 43       Prostate   Heart disease Father        bypass   Heart attack Father 25   Hypertension Father    Headache Brother    Other Brother        ?something with heart; under eval   Glaucoma Maternal Grandmother    Head & neck cancer Maternal Grandmother    Alzheimer's disease Maternal Grandfather    Cancer Paternal Grandmother        blood?   Cancer Paternal Grandfather        prostate   Heart attack Paternal Uncle 53   Thyroid disease Neg Hx     Past Surgical History:  Procedure Laterality Date   ABDOMINAL HYSTERECTOMY N/A 04/13/2020    Procedure: HYSTERECTOMY ABDOMINAL;  Surgeon: Delsa Bern, MD;  Location: [facility];  Service: Gynecology;  Laterality: N/A;  AUTO INFUSION PROTOCOL   BILATERAL SALPINGECTOMY Bilateral 04/13/2020   Procedure: OPEN BILATERAL SALPINGECTOMY;  Surgeon: Delsa Bern, MD;  Location: [facility];  Service: Gynecology;  Laterality: Bilateral;   BREAST REDUCTION SURGERY Bilateral 04/26/2013   Procedure: BILATERAL BREAST REDUCTION WITH LIPOSUCTION;  Surgeon: Cristine Polio, MD;  Location: [facility];  Service: Plastics;  Laterality: Bilateral;   CHOLECYSTECTOMY N/A 05/10/2016   Procedure: LAPAROSCOPIC CHOLECYSTECTOMY WITH INTRAOPERATIVE CHOLANGIOGRAM;  Surgeon: Donnie Mesa, MD;  Location: [facility];  Service: General;  Laterality: N/A;   FOOT SURGERY  2003; 2004   each foot - bunion,  hammer toe   Social History   Occupational History   Not on file  Tobacco Use   Smoking status: Never   Smokeless tobacco: Never  Vaping Use   Vaping Use: Never used  Substance and Sexual Activity   Alcohol use: Yes    Comment: rarely   Drug use: No   Sexual activity: Yes    Birth control/protection: I.U.D.

## 2021-01-02 DIAGNOSIS — Z6839 Body mass index (BMI) 39.0-39.9, adult: Secondary | ICD-10-CM | POA: Diagnosis not present

## 2021-01-02 DIAGNOSIS — E559 Vitamin D deficiency, unspecified: Secondary | ICD-10-CM | POA: Diagnosis not present

## 2021-01-02 DIAGNOSIS — Z01419 Encounter for gynecological examination (general) (routine) without abnormal findings: Secondary | ICD-10-CM | POA: Diagnosis not present

## 2021-01-02 DIAGNOSIS — Z1231 Encounter for screening mammogram for malignant neoplasm of breast: Secondary | ICD-10-CM | POA: Diagnosis not present

## 2021-03-09 ENCOUNTER — Encounter: Payer: Self-pay | Admitting: Family Medicine

## 2021-03-09 ENCOUNTER — Ambulatory Visit: Payer: Federal, State, Local not specified - PPO | Admitting: Family Medicine

## 2021-03-09 VITALS — BP 104/78 | HR 79 | Temp 98.4°F | Ht 65.5 in | Wt 232.9 lb

## 2021-03-09 DIAGNOSIS — Z23 Encounter for immunization: Secondary | ICD-10-CM

## 2021-03-09 DIAGNOSIS — I10 Essential (primary) hypertension: Secondary | ICD-10-CM | POA: Diagnosis not present

## 2021-03-09 DIAGNOSIS — E559 Vitamin D deficiency, unspecified: Secondary | ICD-10-CM

## 2021-03-09 DIAGNOSIS — R739 Hyperglycemia, unspecified: Secondary | ICD-10-CM | POA: Diagnosis not present

## 2021-03-09 LAB — CBC WITH DIFFERENTIAL/PLATELET
Basophils Absolute: 0 10*3/uL (ref 0.0–0.1)
Basophils Relative: 0.3 % (ref 0.0–3.0)
Eosinophils Absolute: 0.1 10*3/uL (ref 0.0–0.7)
Eosinophils Relative: 1.3 % (ref 0.0–5.0)
HCT: 37.3 % (ref 36.0–46.0)
Hemoglobin: 12.1 g/dL (ref 12.0–15.0)
Lymphocytes Relative: 18.8 % (ref 12.0–46.0)
Lymphs Abs: 1.8 10*3/uL (ref 0.7–4.0)
MCHC: 32.4 g/dL (ref 30.0–36.0)
MCV: 86.7 fl (ref 78.0–100.0)
Monocytes Absolute: 0.6 10*3/uL (ref 0.1–1.0)
Monocytes Relative: 5.9 % (ref 3.0–12.0)
Neutro Abs: 7.2 10*3/uL (ref 1.4–7.7)
Neutrophils Relative %: 73.7 % (ref 43.0–77.0)
Platelets: 282 10*3/uL (ref 150.0–400.0)
RBC: 4.31 Mil/uL (ref 3.87–5.11)
RDW: 14.4 % (ref 11.5–15.5)
WBC: 9.7 10*3/uL (ref 4.0–10.5)

## 2021-03-09 LAB — COMPREHENSIVE METABOLIC PANEL
ALT: 20 U/L (ref 0–35)
AST: 22 U/L (ref 0–37)
Albumin: 4.4 g/dL (ref 3.5–5.2)
Alkaline Phosphatase: 83 U/L (ref 39–117)
BUN: 13 mg/dL (ref 6–23)
CO2: 27 mEq/L (ref 19–32)
Calcium: 9.6 mg/dL (ref 8.4–10.5)
Chloride: 102 mEq/L (ref 96–112)
Creatinine, Ser: 0.73 mg/dL (ref 0.40–1.20)
GFR: 100.32 mL/min (ref >60.00)
Glucose, Bld: 107 mg/dL — ABNORMAL HIGH (ref 70–99)
Potassium: 3.9 mEq/L (ref 3.5–5.1)
Sodium: 137 mEq/L (ref 135–145)
Total Bilirubin: 0.9 mg/dL (ref 0.2–1.2)
Total Protein: 7.6 g/dL (ref 6.0–8.3)

## 2021-03-09 LAB — HEMOGLOBIN A1C: Hgb A1c MFr Bld: 6.5 % (ref 4.6–6.5)

## 2021-03-09 LAB — VITAMIN D 25 HYDROXY (VIT D DEFICIENCY, FRACTURES): VITD: 53.09 ng/mL (ref 30.00–100.00)

## 2021-03-09 NOTE — Addendum Note (Signed)
Addended by: Agnes Lawrence on: 03/09/2021 01:52 PM   Modules accepted: Orders

## 2021-03-09 NOTE — Addendum Note (Signed)
Addended by: Rosalyn Gess D on: 03/09/2021 11:42 AM   Modules accepted: Orders

## 2021-03-09 NOTE — Progress Notes (Signed)
Michele [name] DOB: [birth year] Encounter date: 03/09/2021  This is a 45 y.o. female who presents with Chief Complaint  Patient presents with   Follow-up    History of present illness: Last visit with me was 09/06/2020 for complete physical.  Hypertension: Lisinopril-hydrochlorothiazide 20-12.5 mg daily. She checks occasionally at home and it has been ok. She got sick; and medication she was given spiked up her pressure. Stopped taking med; doesn't remember what it was.   Hyperglycemia: Blood sugars been well controlled with diet and exercise.  Last A1c was 5/11 and was 5.9.  Seasonal allergies: haven't been bothering her. Not on meds.   She is having surgery on right knee. At some point hurt knee and has bone fragment in knee and gets some locking up in the knee. She will have surgery once they schedule.   No problems with bleeding/clotting with prior surgery.  No Known Allergies Current Meds  Medication Sig   blood glucose meter kit and supplies KIT Dispense based on patient and insurance preference. Use up to four times daily as directed. (FOR ICD-9 250.00, 250.01).   chlorhexidine (PERIDEX) 0.12 % solution Use as directed 15 mLs in the mouth or throat daily as needed (gum irritation).   Cholecalciferol (VITAMIN D) 50 MCG (2000 UT) CAPS Take 1 capsule (2,000 Units total) by mouth daily.   ELDERBERRY PO Take 50 mg by mouth daily.   lisinopril-hydrochlorothiazide (ZESTORETIC) 20-12.5 MG tablet TAKE 1 TABLET BY MOUTH EVERY DAY   Multiple Vitamins-Minerals (MULTIVITAMIN ADULTS PO) Take by mouth daily.   [DISCONTINUED] pseudoephedrine (SUDAFED) 30 MG tablet Take 1 tablet (30 mg total) by mouth every 8 (eight) hours as needed for congestion.    Review of Systems  Constitutional:  Negative for chills, fatigue and fever.  Respiratory:  Negative for cough, chest tightness, shortness of breath and wheezing.   Cardiovascular:  Negative for chest pain, palpitations and leg swelling.    Objective:  BP 104/78 (BP Location: Left Arm, Patient Position: Sitting, Cuff Size: Large)   Pulse 79   Temp 98.4 F (36.9 C) (Oral)   Ht 5' 5.5" (1.664 m)   Wt 232 lb 14.4 oz (105.6 kg)   SpO2 98%   BMI 38.17 kg/m   Weight: 232 lb 14.4 oz (105.6 kg)   BP Readings from Last 3 Encounters:  03/09/21 104/78  12/29/20 118/80  12/20/20 (!) 149/92   Wt Readings from Last 3 Encounters:  03/09/21 232 lb 14.4 oz (105.6 kg)  12/29/20 231 lb 12.8 oz (105.1 kg)  10/12/20 227 lb 9.6 oz (103.2 kg)    Physical Exam Constitutional:      General: She is not in acute distress.    Appearance: She is well-developed.  Cardiovascular:     Rate and Rhythm: Normal rate and regular rhythm.     Heart sounds: Normal heart sounds. No murmur heard.   No friction rub.  Pulmonary:     Effort: Pulmonary effort is normal. No respiratory distress.     Breath sounds: Normal breath sounds. No wheezing or rales.  Musculoskeletal:     Right lower leg: No edema.     Left lower leg: No edema.  Neurological:     Mental Status: She is alert and oriented to person, place, and time.  Psychiatric:        Behavior: Behavior normal.    Assessment/Plan  1. Essential hypertension Well controlled. Continue with current medications.  - CBC with Differential/Platelet; Future - Comprehensive metabolic panel;  Future  2. Hyperglycemia Diet controlled.  - Hemoglobin A1c; Future  3. Vitamin D deficiency On replacement   Return in about 6 months (around 09/06/2021) for physical exam.  medically stable for surgery.     Micheline Rough, MD

## 2021-03-14 ENCOUNTER — Encounter: Payer: Self-pay | Admitting: Family Medicine

## 2021-04-06 ENCOUNTER — Encounter: Payer: Self-pay | Admitting: Orthopedic Surgery

## 2021-04-07 ENCOUNTER — Other Ambulatory Visit: Payer: Self-pay | Admitting: Family Medicine

## 2021-04-18 DIAGNOSIS — H5213 Myopia, bilateral: Secondary | ICD-10-CM | POA: Diagnosis not present

## 2021-04-18 DIAGNOSIS — E119 Type 2 diabetes mellitus without complications: Secondary | ICD-10-CM | POA: Diagnosis not present

## 2021-04-18 DIAGNOSIS — H52222 Regular astigmatism, left eye: Secondary | ICD-10-CM | POA: Diagnosis not present

## 2021-05-03 ENCOUNTER — Encounter (HOSPITAL_BASED_OUTPATIENT_CLINIC_OR_DEPARTMENT_OTHER): Payer: Self-pay | Admitting: Orthopedic Surgery

## 2021-05-07 ENCOUNTER — Encounter: Payer: Self-pay | Admitting: Orthopedic Surgery

## 2021-05-08 ENCOUNTER — Encounter: Payer: Self-pay | Admitting: Surgical

## 2021-05-10 ENCOUNTER — Other Ambulatory Visit: Payer: Self-pay

## 2021-05-10 ENCOUNTER — Encounter (HOSPITAL_BASED_OUTPATIENT_CLINIC_OR_DEPARTMENT_OTHER)
Admission: RE | Admit: 2021-05-10 | Discharge: 2021-05-10 | Disposition: A | Discharge status: Home / Self Care | Payer: Federal, State, Local not specified - PPO | Source: Ambulatory Visit | Attending: Orthopedic Surgery | Admitting: Orthopedic Surgery

## 2021-05-10 DIAGNOSIS — Z01812 Encounter for preprocedural laboratory examination: Secondary | ICD-10-CM | POA: Diagnosis not present

## 2021-05-10 LAB — CBC
HCT: 38.3 % (ref 36.0–46.0)
Hemoglobin: 12.3 g/dL (ref 12.0–15.0)
MCH: 28.3 pg (ref 26.0–34.0)
MCHC: 32.1 g/dL (ref 30.0–36.0)
MCV: 88.2 fL (ref 80.0–100.0)
Platelets: 304 10*3/uL (ref 150–400)
RBC: 4.34 MIL/uL (ref 3.87–5.11)
RDW: 12.9 % (ref 11.5–15.5)
WBC: 10.3 10*3/uL (ref 4.0–10.5)
nRBC: 0 % (ref 0.0–0.2)

## 2021-05-10 LAB — BASIC METABOLIC PANEL
Anion gap: 10 (ref 5–15)
BUN: 11 mg/dL (ref 6–20)
CO2: 27 mmol/L (ref 22–32)
Calcium: 9.4 mg/dL (ref 8.9–10.3)
Chloride: 105 mmol/L (ref 98–111)
Creatinine, Ser: 0.72 mg/dL (ref 0.44–1.00)
GFR, Estimated: >60 mL/min (ref >60)
Glucose, Bld: 104 mg/dL — ABNORMAL HIGH (ref 70–99)
Potassium: 4.2 mmol/L (ref 3.5–5.1)
Sodium: 142 mmol/L (ref 135–145)

## 2021-05-10 NOTE — Progress Notes (Signed)

## 2021-05-11 LAB — URINE CULTURE: Culture: <10000 — AB

## 2021-05-11 NOTE — Progress Notes (Signed)
Gloriann Loan, [place] aware of urine culture results.

## 2021-05-14 ENCOUNTER — Encounter (HOSPITAL_BASED_OUTPATIENT_CLINIC_OR_DEPARTMENT_OTHER)
Admission: RE | Disposition: A | Discharge status: Home / Self Care | Payer: Self-pay | Source: Home / Self Care | Attending: Orthopedic Surgery

## 2021-05-14 ENCOUNTER — Other Ambulatory Visit: Payer: Self-pay

## 2021-05-14 ENCOUNTER — Encounter (HOSPITAL_BASED_OUTPATIENT_CLINIC_OR_DEPARTMENT_OTHER): Payer: Self-pay | Admitting: Orthopedic Surgery

## 2021-05-14 ENCOUNTER — Telehealth: Payer: Self-pay

## 2021-05-14 ENCOUNTER — Ambulatory Visit (HOSPITAL_BASED_OUTPATIENT_CLINIC_OR_DEPARTMENT_OTHER)
Admission: RE | Admit: 2021-05-14 | Discharge: 2021-05-14 | Disposition: A | Discharge status: Home / Self Care | Payer: Federal, State, Local not specified - PPO | Attending: Orthopedic Surgery | Admitting: Orthopedic Surgery

## 2021-05-14 ENCOUNTER — Ambulatory Visit (HOSPITAL_BASED_OUTPATIENT_CLINIC_OR_DEPARTMENT_OTHER): Payer: Federal, State, Local not specified - PPO | Admitting: Certified Registered"

## 2021-05-14 DIAGNOSIS — I10 Essential (primary) hypertension: Secondary | ICD-10-CM | POA: Diagnosis not present

## 2021-05-14 DIAGNOSIS — Z6837 Body mass index (BMI) 37.0-37.9, adult: Secondary | ICD-10-CM | POA: Insufficient documentation

## 2021-05-14 DIAGNOSIS — E669 Obesity, unspecified: Secondary | ICD-10-CM | POA: Insufficient documentation

## 2021-05-14 DIAGNOSIS — M93261 Osteochondritis dissecans, right knee: Secondary | ICD-10-CM

## 2021-05-14 DIAGNOSIS — M25561 Pain in right knee: Secondary | ICD-10-CM | POA: Diagnosis not present

## 2021-05-14 DIAGNOSIS — N938 Other specified abnormal uterine and vaginal bleeding: Secondary | ICD-10-CM | POA: Diagnosis not present

## 2021-05-14 DIAGNOSIS — Z01818 Encounter for other preprocedural examination: Secondary | ICD-10-CM

## 2021-05-14 DIAGNOSIS — E119 Type 2 diabetes mellitus without complications: Secondary | ICD-10-CM | POA: Diagnosis not present

## 2021-05-14 DIAGNOSIS — D219 Benign neoplasm of connective and other soft tissue, unspecified: Secondary | ICD-10-CM | POA: Insufficient documentation

## 2021-05-14 DIAGNOSIS — G8918 Other acute postprocedural pain: Secondary | ICD-10-CM | POA: Diagnosis not present

## 2021-05-14 DIAGNOSIS — M958 Other specified acquired deformities of musculoskeletal system: Secondary | ICD-10-CM | POA: Diagnosis not present

## 2021-05-14 HISTORY — PX: OSTEOCHONDRAL DEFECT REPAIR/RECONSTRUCTION: SHX6232

## 2021-05-14 SURGERY — APPLICATION, GRAFT, OSTEOCHONDRAL, KNEE
Anesthesia: General | Site: Knee | Laterality: Right

## 2021-05-14 MED ORDER — LACTATED RINGERS IV SOLN: INTRAVENOUS | Status: DC

## 2021-05-14 MED ORDER — METHOCARBAMOL 500 MG PO TABS: 500.0000 mg | ORAL_TABLET | Freq: Three times a day (TID) | ORAL | 0 refills | Status: DC | PRN

## 2021-05-14 MED ORDER — MORPHINE SULFATE (PF) 4 MG/ML IV SOLN
INTRAVENOUS | Status: DC | PRN
  Administered 2021-05-14: 8 mg via SUBCUTANEOUS

## 2021-05-14 MED ORDER — FENTANYL CITRATE (PF) 100 MCG/2ML IJ SOLN
100.0000 ug | Freq: Once | INTRAMUSCULAR | Status: AC
  Administered 2021-05-14: 100 ug via INTRAVENOUS

## 2021-05-14 MED ORDER — DEXAMETHASONE SODIUM PHOSPHATE 10 MG/ML IJ SOLN
INTRAMUSCULAR | Status: DC | PRN
  Administered 2021-05-14: 10 mg via INTRAVENOUS

## 2021-05-14 MED ORDER — BUPIVACAINE HCL (PF) 0.25 % IJ SOLN
INTRAMUSCULAR | Status: AC
  Filled 2021-05-14: qty 30

## 2021-05-14 MED ORDER — HEPARIN SODIUM (PORCINE) 5000 UNIT/ML IJ SOLN
INTRAMUSCULAR | Status: AC
  Filled 2021-05-14: qty 1

## 2021-05-14 MED ORDER — CLONIDINE HCL (ANALGESIA) 100 MCG/ML EP SOLN
EPIDURAL | Status: AC
  Filled 2021-05-14: qty 10

## 2021-05-14 MED ORDER — TRANEXAMIC ACID-NACL 1000-0.7 MG/100ML-% IV SOLN
1000.0000 mg | INTRAVENOUS | Status: AC
  Administered 2021-05-14: 1000 mg via INTRAVENOUS

## 2021-05-14 MED ORDER — ACD FORMULA A 0.73-2.45-2.2 GM/100ML VI SOLN
Status: AC | PRN
  Administered 2021-05-14: 30 mL

## 2021-05-14 MED ORDER — MORPHINE SULFATE (PF) 4 MG/ML IV SOLN
INTRAVENOUS | Status: AC
  Filled 2021-05-14: qty 2

## 2021-05-14 MED ORDER — OXYCODONE HCL 5 MG PO TABS
ORAL_TABLET | ORAL | Status: AC
  Filled 2021-05-14: qty 1

## 2021-05-14 MED ORDER — BUPIVACAINE-EPINEPHRINE (PF) 0.25% -1:200000 IJ SOLN
INTRAMUSCULAR | Status: AC
  Filled 2021-05-14: qty 30

## 2021-05-14 MED ORDER — RIVAROXABAN 10 MG PO TABS: 10.0000 mg | ORAL_TABLET | Freq: Every day | ORAL | 0 refills | Status: DC

## 2021-05-14 MED ORDER — HEPARIN SODIUM (PORCINE) 5000 UNIT/ML IJ SOLN
INTRAMUSCULAR | Status: DC | PRN
  Administered 2021-05-14: 5000 [IU]

## 2021-05-14 MED ORDER — MORPHINE SULFATE (PF) 4 MG/ML IV SOLN
INTRAVENOUS | Status: AC
  Filled 2021-05-14: qty 1

## 2021-05-14 MED ORDER — MIDAZOLAM HCL 2 MG/2ML IJ SOLN
INTRAMUSCULAR | Status: AC
  Filled 2021-05-14: qty 2

## 2021-05-14 MED ORDER — ROPIVACAINE HCL 5 MG/ML IJ SOLN
INTRAMUSCULAR | Status: DC | PRN
  Administered 2021-05-14: 30 mL via PERINEURAL

## 2021-05-14 MED ORDER — HEPARIN 5000 UNITS IN NS 1000 ML (FLUSH)
INTRAMUSCULAR | Status: DC | PRN
Start: 2021-05-14 — End: 2021-05-14

## 2021-05-14 MED ORDER — FENTANYL CITRATE (PF) 100 MCG/2ML IJ SOLN
INTRAMUSCULAR | Status: AC
  Filled 2021-05-14: qty 2

## 2021-05-14 MED ORDER — PROPOFOL 10 MG/ML IV BOLUS
INTRAVENOUS | Status: DC | PRN
  Administered 2021-05-14: 200 mg via INTRAVENOUS
  Administered 2021-05-14: 50 mg via INTRAVENOUS
  Administered 2021-05-14: 150 mg via INTRAVENOUS
  Administered 2021-05-14: 50 mg via INTRAVENOUS

## 2021-05-14 MED ORDER — SODIUM CHLORIDE 0.9 % IR SOLN
Status: DC | PRN
  Administered 2021-05-14: 3000 mL

## 2021-05-14 MED ORDER — AMISULPRIDE (ANTIEMETIC) 5 MG/2ML IV SOLN: 10.0000 mg | Freq: Once | INTRAVENOUS | Status: DC | PRN

## 2021-05-14 MED ORDER — PROMETHAZINE HCL 25 MG/ML IJ SOLN: 6.2500 mg | INTRAMUSCULAR | Status: DC | PRN

## 2021-05-14 MED ORDER — POVIDONE-IODINE 10 % EX SWAB
2.0000 "application " | Freq: Once | CUTANEOUS | Status: AC
  Administered 2021-05-14: 2 via TOPICAL

## 2021-05-14 MED ORDER — OXYCODONE HCL 5 MG PO TABS
5.0000 mg | ORAL_TABLET | Freq: Once | ORAL | Status: AC | PRN
  Administered 2021-05-14: 5 mg via ORAL

## 2021-05-14 MED ORDER — CLONIDINE HCL (ANALGESIA) 100 MCG/ML EP SOLN
EPIDURAL | Status: DC | PRN
  Administered 2021-05-14: 100 ug

## 2021-05-14 MED ORDER — ASPIRIN 81 MG PO CHEW
81.0000 mg | CHEWABLE_TABLET | Freq: Every day | ORAL | 0 refills | Status: AC
Start: 2021-05-14 — End: 2021-06-13

## 2021-05-14 MED ORDER — BUPIVACAINE HCL (PF) 0.25 % IJ SOLN
INTRAMUSCULAR | Status: DC | PRN
  Administered 2021-05-14: 30 mL

## 2021-05-14 MED ORDER — CELECOXIB 100 MG PO CAPS: 100.0000 mg | ORAL_CAPSULE | Freq: Two times a day (BID) | ORAL | 0 refills | Status: DC

## 2021-05-14 MED ORDER — ONDANSETRON HCL 4 MG/2ML IJ SOLN
INTRAMUSCULAR | Status: DC | PRN
  Administered 2021-05-14: 4 mg via INTRAVENOUS

## 2021-05-14 MED ORDER — FENTANYL CITRATE (PF) 100 MCG/2ML IJ SOLN
INTRAMUSCULAR | Status: DC | PRN
  Administered 2021-05-14 (×2): 25 ug via INTRAVENOUS
  Administered 2021-05-14: 50 ug via INTRAVENOUS
  Administered 2021-05-14 (×3): 25 ug via INTRAVENOUS

## 2021-05-14 MED ORDER — DEXMEDETOMIDINE (PRECEDEX) IN NS 20 MCG/5ML (4 MCG/ML) IV SYRINGE
PREFILLED_SYRINGE | INTRAVENOUS | Status: DC | PRN
  Administered 2021-05-14: 12 ug via INTRAVENOUS

## 2021-05-14 MED ORDER — MIDAZOLAM HCL 2 MG/2ML IJ SOLN
2.0000 mg | Freq: Once | INTRAMUSCULAR | Status: AC
  Administered 2021-05-14: 2 mg via INTRAVENOUS

## 2021-05-14 MED ORDER — OXYCODONE HCL 5 MG/5ML PO SOLN: 5.0000 mg | Freq: Once | ORAL | Status: AC | PRN

## 2021-05-14 MED ORDER — HYDROMORPHONE HCL 1 MG/ML IJ SOLN: 0.2500 mg | INTRAMUSCULAR | Status: DC | PRN

## 2021-05-14 MED ORDER — OXYCODONE-ACETAMINOPHEN 5-325 MG PO TABS: 1.0000 | ORAL_TABLET | ORAL | 0 refills | Status: DC | PRN

## 2021-05-14 MED ORDER — POVIDONE-IODINE 7.5 % EX SOLN: Freq: Once | CUTANEOUS | Status: AC

## 2021-05-14 MED ORDER — PROPOFOL 10 MG/ML IV BOLUS
INTRAVENOUS | Status: AC
  Filled 2021-05-14: qty 20

## 2021-05-14 MED ORDER — LIDOCAINE-EPINEPHRINE 1 %-1:100000 IJ SOLN
INTRAMUSCULAR | Status: AC
  Filled 2021-05-14: qty 1

## 2021-05-14 MED ORDER — TRANEXAMIC ACID-NACL 1000-0.7 MG/100ML-% IV SOLN
INTRAVENOUS | Status: AC
  Filled 2021-05-14: qty 100

## 2021-05-14 MED ORDER — CEFAZOLIN SODIUM-DEXTROSE 2-4 GM/100ML-% IV SOLN
2.0000 g | INTRAVENOUS | Status: AC
  Administered 2021-05-14: 2 g via INTRAVENOUS

## 2021-05-14 MED ORDER — MEPERIDINE HCL 25 MG/ML IJ SOLN: 6.2500 mg | INTRAMUSCULAR | Status: DC | PRN

## 2021-05-14 MED ORDER — CEFAZOLIN SODIUM-DEXTROSE 2-4 GM/100ML-% IV SOLN
INTRAVENOUS | Status: AC
  Filled 2021-05-14: qty 100

## 2021-05-14 MED ORDER — THROMBIN 5000 UNITS EX SOLR
CUTANEOUS | Status: AC
  Filled 2021-05-14: qty 5000

## 2021-05-14 MED ORDER — LIDOCAINE HCL (CARDIAC) PF 100 MG/5ML IV SOSY
PREFILLED_SYRINGE | INTRAVENOUS | Status: DC | PRN
  Administered 2021-05-14: 40 mg via INTRAVENOUS

## 2021-05-14 SURGICAL SUPPLY — 63 items
BANDAGE ESMARK 6X9 LF (GAUZE/BANDAGES/DRESSINGS) ×1 IMPLANT
BLADE AVERAGE 25X9 (BLADE) ×1 IMPLANT
BLADE SAW SAG 73X25 THK (BLADE) ×1
BLADE SAW SGTL 73X25 THK (BLADE) IMPLANT
BLADE SURG 15 STRL LF DISP TIS (BLADE) ×1 IMPLANT
BLADE SURG 15 STRL SS (BLADE) ×6
BNDG CMPR 9X6 STRL LF SNTH (GAUZE/BANDAGES/DRESSINGS) ×1
BNDG ELASTIC 6X5.8 VLCR STR LF (GAUZE/BANDAGES/DRESSINGS) ×4 IMPLANT
BNDG ESMARK 6X9 LF (GAUZE/BANDAGES/DRESSINGS) ×2
DRAPE EXTREMITY T 121X128X90 (DISPOSABLE) ×2 IMPLANT
DRAPE IMP U-DRAPE 54X76 (DRAPES) ×3 IMPLANT
DRAPE INCISE IOBAN 66X45 STRL (DRAPES) ×3 IMPLANT
DRAPE U-SHAPE 47X51 STRL (DRAPES) ×2 IMPLANT
DRSG TEGADERM 2-3/8X2-3/4 SM (GAUZE/BANDAGES/DRESSINGS) ×1 IMPLANT
DRSG TEGADERM 4X4.75 (GAUZE/BANDAGES/DRESSINGS) ×3 IMPLANT
DURAPREP 26ML APPLICATOR (WOUND CARE) ×3 IMPLANT
ELECT REM PT RETURN 9FT ADLT (ELECTROSURGICAL) ×2
ELECTRODE REM PT RTRN 9FT ADLT (ELECTROSURGICAL) IMPLANT
GAUZE SPONGE 4X4 12PLY STRL (GAUZE/BANDAGES/DRESSINGS) ×2 IMPLANT
GAUZE XEROFORM 1X8 LF (GAUZE/BANDAGES/DRESSINGS) ×2 IMPLANT
GLOVE SRG 8 PF TXTR STRL LF DI (GLOVE) ×1 IMPLANT
GLOVE SURG ENC MOIS LTX SZ7 (GLOVE) ×2 IMPLANT
GLOVE SURG ENC MOIS LTX SZ8 (GLOVE) ×2 IMPLANT
GLOVE SURG UNDER POLY LF SZ7 (GLOVE) ×2 IMPLANT
GLOVE SURG UNDER POLY LF SZ8 (GLOVE) ×2
GOWN STRL REUS W/ TWL LRG LVL3 (GOWN DISPOSABLE) ×1 IMPLANT
GOWN STRL REUS W/ TWL XL LVL3 (GOWN DISPOSABLE) ×1 IMPLANT
GOWN STRL REUS W/TWL LRG LVL3 (GOWN DISPOSABLE) ×2
GOWN STRL REUS W/TWL XL LVL3 (GOWN DISPOSABLE) ×4
GRAFT TISS RT MED FEM HEMI (Tissue) IMPLANT
GRAFT TISSUE HEMIFEM CONDYLE (Tissue) ×2 IMPLANT
HANDPIECE INTERPULSE COAX TIP (DISPOSABLE) ×2
IMMOBILIZER KNEE 22 UNIV (SOFTGOODS) ×1 IMPLANT
IV NS IRRIG 3000ML ARTHROMATIC (IV SOLUTION) ×1 IMPLANT
KIT ALLOGRAFT OATS 20 DISP (KITS) ×1 IMPLANT
KIT BONE MRW ASP ANGEL CPRP (KITS) ×1 IMPLANT
NDL SAFETY ECLIPSE 18X1.5 (NEEDLE) ×2 IMPLANT
NEEDLE HYPO 18GX1.5 SHARP (NEEDLE) ×2
PACK ARTHROSCOPY DSU (CUSTOM PROCEDURE TRAY) ×2 IMPLANT
PACK BASIN DAY SURGERY FS (CUSTOM PROCEDURE TRAY) ×2 IMPLANT
PACK UNIVERSAL I (CUSTOM PROCEDURE TRAY) ×2 IMPLANT
PADDING CAST COTTON 6X4 STRL (CAST SUPPLIES) ×1 IMPLANT
PENCIL SMOKE EVACUATOR (MISCELLANEOUS) ×1 IMPLANT
SET HNDPC FAN SPRY TIP SCT (DISPOSABLE) IMPLANT
SPONGE T-LAP 18X18 ~~LOC~~+RFID (SPONGE) ×2 IMPLANT
SUT ETHILON 3 0 PS 1 (SUTURE) ×2 IMPLANT
SUT MNCRL AB 3-0 PS2 18 (SUTURE) ×2 IMPLANT
SUT VIC AB 0 CT1 27 (SUTURE)
SUT VIC AB 0 CT1 27XCR 8 STRN (SUTURE) ×1 IMPLANT
SUT VIC AB 1 CT1 27 (SUTURE) ×6
SUT VIC AB 1 CT1 27XBRD ANBCTR (SUTURE) ×2 IMPLANT
SUT VIC AB 2-0 SH 27 (SUTURE) ×4
SUT VIC AB 2-0 SH 27XBRD (SUTURE) IMPLANT
SUT VICRYL 0 UR6 27IN ABS (SUTURE) ×4 IMPLANT
SUTURE TAPE 1.3 40 TPR END (SUTURE) IMPLANT
SUTURETAPE 1.3 40 TPR END (SUTURE) ×2
SYR 3ML 18GX1 1/2 (SYRINGE) ×1 IMPLANT
SYR BULB IRRIG 60ML STRL (SYRINGE) ×2 IMPLANT
SYR CONTROL 10ML LL (SYRINGE) ×2 IMPLANT
TOWEL GREEN STERILE FF (TOWEL DISPOSABLE) ×3 IMPLANT
TRAY DSU PREP LF (CUSTOM PROCEDURE TRAY) ×2 IMPLANT
WRAP KNEE MAXI GEL POST OP (GAUZE/BANDAGES/DRESSINGS) ×1 IMPLANT
YANKAUER SUCT BULB TIP NO VENT (SUCTIONS) ×3 IMPLANT

## 2021-05-14 NOTE — Anesthesia Procedure Notes (Signed)
Anesthesia Regional Block: Adductor canal block   Pre-Anesthetic Checklist: , timeout performed,  Correct Patient, Correct Site, Correct Laterality,  Correct Procedure, Correct Position, site marked,  Risks and benefits discussed,  Surgical consent,  Pre-op evaluation,  At surgeon's request and post-op pain management  Laterality: Right  Prep: chloraprep       Needles:  Injection technique: Single-shot  Needle Type: Stimiplex     Needle Length: 9cm  Needle Gauge: 21     Additional Needles:   Procedures:,,,, ultrasound used (permanent image in chart),,    Narrative:  Start time: 05/14/2021 9:52 AM End time: 05/14/2021 9:57 AM Injection made incrementally with aspirations every 5 mL.  Performed by: Personally  Anesthesiologist: Lynda Rainwater, MD

## 2021-05-14 NOTE — Anesthesia Postprocedure Evaluation (Signed)
Anesthesia Post Note  Patient: Michele [name]  Procedure(s) Performed: right knee osteochondral open allograft fro medial femoral defect (Right: Knee)     Patient location during evaluation: PACU Anesthesia Type: General Level of consciousness: awake and alert Pain management: pain level controlled Vital Signs Assessment: post-procedure vital signs reviewed and stable Respiratory status: spontaneous breathing, nonlabored ventilation, respiratory function stable and patient connected to nasal cannula oxygen Cardiovascular status: blood pressure returned to baseline and stable Postop Assessment: no apparent nausea or vomiting Anesthetic complications: no   No notable events documented.  Last Vitals:  Vitals:   05/14/21 1500 05/14/21 1515  BP: 131/78 (!) 127/91  Pulse: 79 66  Resp:  16  Temp:  37 C  SpO2: 97% 93%    Last Pain:  Vitals:   05/14/21 1515  TempSrc:   PainSc: 4                  Barnet Glasgow

## 2021-05-14 NOTE — Anesthesia Preprocedure Evaluation (Signed)
Anesthesia Evaluation  Patient identified by MRN, date of birth, ID band Patient awake    Reviewed: Allergy & Precautions, NPO status , Patient's Chart, lab work & pertinent test results  Airway Mallampati: II  TM Distance: >3 FB Neck ROM: Full    Dental  (+) Teeth Intact, Dental Advisory Given   Pulmonary neg pulmonary ROS,    Pulmonary exam normal breath sounds clear to auscultation       Cardiovascular hypertension, Pt. on medications Normal cardiovascular exam Rhythm:Regular Rate:Normal     Neuro/Psych negative neurological ROS  negative psych ROS   GI/Hepatic negative GI ROS, Neg liver ROS,   Endo/Other  diabetes, Well Controlled, Type 2Obesity   Renal/GU negative Renal ROS     Musculoskeletal negative musculoskeletal ROS (+)   Abdominal (+) + obese,   Peds  Hematology   Anesthesia Other Findings   Reproductive/Obstetrics FIBROIDS  DYSFUNCTIONAL UTERINE BLEEDING                             Anesthesia Physical  Anesthesia Plan  ASA: II  Anesthesia Plan: General   Post-op Pain Management:  Regional for Post-op pain and Regional block   Induction: Intravenous  PONV Risk Score and Plan: 3 and Midazolam, Dexamethasone, Ondansetron and Treatment may vary due to age or medical condition  Airway Management Planned: LMA  Additional Equipment:   Intra-op Plan:   Post-operative Plan: Extubation in OR  Informed Consent: I have reviewed the patients History and Physical, chart, labs and discussed the procedure including the risks, benefits and alternatives for the proposed anesthesia with the patient or authorized representative who has indicated his/her understanding and acceptance.       Plan Discussed with: CRNA  Anesthesia Plan Comments: ( )        Anesthesia Quick Evaluation

## 2021-05-14 NOTE — Brief Op Note (Signed)
° °  05/14/2021  2:05 PM  PATIENT:  Michele [name]  46 y.o. female  PRE-OPERATIVE DIAGNOSIS:  RIGHT KNEE OSTEOCHONDRAL DEFECT  POST-OPERATIVE DIAGNOSIS:  RIGHT KNEE OSTEOCHONDRAL DEFECT  PROCEDURE:  Procedure(s): right knee osteochondral open allograft fro medial femoral defect,bmac grafting, removal of loose body  SURGEON:  Surgeon(s): Marlou Sa, Tonna Corner, MD  ASSISTANT: magnant pa  ANESTHESIA:   general  EBL: 50 ml    Total I/O In: 1300 [I.V.:1200; IV Piggyback:100] Out: 100 [Blood:100]  BLOOD ADMINISTERED: none  DRAINS: none   LOCAL MEDICATIONS USED:  marcaine mso4 clonidine  SPECIMEN:  No Specimen  COUNTS:  YES  TOURNIQUET:   Total Tourniquet Time Documented: Thigh (Right) - 85 minutes Total: Thigh (Right) - 85 minutes   DICTATION: .Other Dictation: Dictation Number [id number]  PLAN OF CARE: Discharge to home after PACU  PATIENT DISPOSITION:  PACU - hemodynamically stable

## 2021-05-14 NOTE — Discharge Instructions (Signed)

## 2021-05-14 NOTE — H&P (Signed)
Michele [name] is an 46 y.o. female.   Chief Complaint: Right knee pain HPI: Michele [name] is a 46 year old patient with right knee pain.  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  This has been going on for likely at least a year.  Defect measures at least 2 x 2 cm.  Patient reports mechanical symptoms as well as pain.  Presents now for operative management after explanation of risks and benefits.  No personal or family history of DVT or pulmonary embolism.  Past Medical History:  Diagnosis Date   Diabetes mellitus without complication ([place])    Hypertension    under control with med., has been on med. x 2 yr.   Hypertrophy of breast 03/2013   Obesity     Past Surgical History:  Procedure Laterality Date   ABDOMINAL HYSTERECTOMY N/A 04/13/2020   Procedure: HYSTERECTOMY ABDOMINAL;  Surgeon: Delsa Bern, MD;  Location: [place];  Service: Gynecology;  Laterality: N/A;  AUTO INFUSION PROTOCOL   BILATERAL SALPINGECTOMY Bilateral 04/13/2020   Procedure: OPEN BILATERAL SALPINGECTOMY;  Surgeon: Delsa Bern, MD;  Location: [place];  Service: Gynecology;  Laterality: Bilateral;   BREAST REDUCTION SURGERY Bilateral 04/26/2013   Procedure: BILATERAL BREAST REDUCTION WITH LIPOSUCTION;  Surgeon: Cristine Polio, MD;  Location: [place];  Service: Plastics;  Laterality: Bilateral;   CHOLECYSTECTOMY N/A 05/10/2016   Procedure: LAPAROSCOPIC CHOLECYSTECTOMY WITH INTRAOPERATIVE CHOLANGIOGRAM;  Surgeon: Donnie Mesa, MD;  Location: [place] [facility];  Service: General;  Laterality: N/A;   FOOT SURGERY  2003; 2004   each foot - bunion, hammer toe    Family History  Problem Relation Age of Onset   Migraines Mother    Arthritis Mother    Deep vein thrombosis Mother        started after ortho surgery; lifetime   Hyperlipidemia Mother    Cancer Father 23       Prostate   Heart disease Father        bypass   Heart attack Father 17    Hypertension Father    Headache Brother    Other Brother        ?something with heart; under eval   Glaucoma Maternal Grandmother    Head & neck cancer Maternal Grandmother    Alzheimer's disease Maternal Grandfather    Cancer Paternal Grandmother        blood?   Cancer Paternal Grandfather        prostate   Heart attack Paternal Uncle 3   Thyroid disease Neg Hx    Social History:  reports that she has never smoked. She has never used smokeless tobacco. She reports current alcohol use. She reports that she does not use drugs.  Allergies: No Known Allergies  Medications Prior to Admission  Medication Sig Dispense Refill   chlorhexidine (PERIDEX) 0.12 % solution Use as directed 15 mLs in the mouth or throat daily as needed (gum irritation).     Cholecalciferol (VITAMIN D) 50 MCG (2000 UT) CAPS Take 1 capsule (2,000 Units total) by mouth daily. 30 capsule    ELDERBERRY PO Take 50 mg by mouth daily.     lisinopril-hydrochlorothiazide (ZESTORETIC) 20-12.5 MG tablet TAKE 1 TABLET BY MOUTH EVERY DAY 90 tablet 1   Multiple Vitamins-Minerals (MULTIVITAMIN ADULTS PO) Take by mouth daily.     blood glucose meter kit and supplies KIT Dispense based on patient and insurance preference. Use up to four times daily as  directed. (FOR ICD-9 250.00, 250.01). 1 each 0    No results found for this or any previous visit (from the past 48 hour(s)). No results found.  Review of Systems  Musculoskeletal:  Positive for arthralgias.  All other systems reviewed and are negative.  Blood pressure 111/74, pulse 74, temperature 98.8 F (37.1 C), temperature source Oral, resp. rate 12, height 5' 5.5" (1.664 m), weight 104 kg, SpO2 100 %. Physical Exam Vitals reviewed.  HENT:     Head: Normocephalic.     Nose: Nose normal.     Mouth/Throat:     Mouth: Mucous membranes are moist.  Eyes:     Pupils: Pupils are equal, round, and reactive to light.  Cardiovascular:     Rate and Rhythm: Normal rate.      Pulses: Normal pulses.  Pulmonary:     Effort: Pulmonary effort is normal.  Abdominal:     General: Abdomen is flat.  Musculoskeletal:     Cervical back: Normal range of motion.  Skin:    Capillary Refill: Capillary refill takes less than 2 seconds.  Neurological:     General: No focal deficit present.     Mental Status: She is alert.  Psychiatric:        Mood and Affect: Mood normal.  Examination of the right knee demonstrates trace effusion with full range of motion stable collateral cruciate ligaments.  Pedal pulses palpable.  Extensor mechanism intact.  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  Plan is osteochondral allograft for sizable osteochondral defect.  The risk and benefits are discussed include not limited to infection nerve vessel damage incomplete healing as well as potential need for more surgery.  Patient understands risk benefits as well as the 4 to 6-week period of nonweightbearing which will be required.  All questions answered  Anderson Malta, MD 05/14/2021, 10:20 AM

## 2021-05-14 NOTE — Transfer of Care (Signed)
Immediate Anesthesia Transfer of Care Note  Patient: Michele [name]  Procedure(s) Performed: right knee osteochondral open allograft fro medial femoral defect (Right: Knee)  Patient Location: PACU  Anesthesia Type:General and Regional  Level of Consciousness: drowsy  Airway & Oxygen Therapy: Patient Spontanous Breathing and Patient connected to face mask oxygen  Post-op Assessment: Report given to RN and Post -op Vital signs reviewed and stable  Post vital signs: Reviewed and stable  Last Vitals:  Vitals Value Taken Time  BP 123/80 05/14/21 1406  Temp    Pulse 99 05/14/21 1408  Resp 20 05/14/21 1408  SpO2 100 % 05/14/21 1408  Vitals shown include unvalidated device data.  Last Pain:  Vitals:   05/14/21 0907  TempSrc: Oral  PainSc: 0-No pain      Patients Stated Pain Goal: 5 (30/94/07 6808)  Complications: No notable events documented.

## 2021-05-14 NOTE — Telephone Encounter (Signed)
Called and spoke with the pharmacist and have cancel celebrex and aspirin medication as requested by Annie Main PA-C

## 2021-05-14 NOTE — Progress Notes (Signed)
AssistedDr. Miller with right, ultrasound guided, adductor canal block. Side rails up, monitors on throughout procedure. See vital signs in flow sheet. Tolerated Procedure well.  

## 2021-05-14 NOTE — Anesthesia Procedure Notes (Signed)
Procedure Name: LMA Insertion Date/Time: 05/14/2021 11:13 AM Performed by: Lavonia Dana, CRNA Pre-anesthesia Checklist: Patient identified, Emergency Drugs available, Suction available and Patient being monitored Patient Re-evaluated:Patient Re-evaluated prior to induction Oxygen Delivery Method: Circle system utilized Preoxygenation: Pre-oxygenation with 100% oxygen Induction Type: IV induction Ventilation: Mask ventilation without difficulty and Oral airway inserted - appropriate to patient size LMA: LMA with gastric port inserted LMA Size: 4.0 Number of attempts: 3 Airway Equipment and Method: Bite block Placement Confirmation: positive ETCO2 Tube secured with: Tape Dental Injury: Teeth and Oropharynx as per pre-operative assessment  Comments: Difficult placement with LMA #4; LMA with gastric port utilized. Good TVs, +ETCO2

## 2021-05-15 NOTE — Op Note (Signed)
NAMEPERLINE, AWE MEDICAL RECORD NO: [id number] ACCOUNT NO: [id number] DATE OF BIRTH: [birth year] FACILITY: [facility] LOCATION: [facility] PHYSICIAN: Yetta Barre. Marlou Sa, MD  Operative Report   DATE OF PROCEDURE: 05/14/2021  PREOPERATIVE DIAGNOSIS:  Right knee osteochondritis dissecans defect with loose body in the knee.  POSTOPERATIVE DIAGNOSIS:  Right knee osteochondritis dissecans defect with loose body in the knee.  PROCEDURE:  Right knee arthrotomy with removal of loose body and osteochondral allograft grafting of a 2 x 2 cm full-thickness osteochondral defect.  SURGEON:  Meredith Pel, MD  ASSISTANT:  Annie Main, PA  INDICATIONS:  The patient is a 46 year old patient with right knee pain and loose body consistent with osteochondral OCD measuring over 2 x 2 cm. Presents now for operative management after explanation of risks and benefits.  DESCRIPTION OF PROCEDURE:  The patient was brought to the operating room where general anesthetic was induced.  Preoperative antibiotics were administered.  Timeout was called.  Right hip and leg were prescrubbed with alcohol and Betadine, allowed to air  dry, prepped with DuraPrep solution and draped in a sterile manner.  Charlie Pitter was used to cover the operative field.  Timeout was called.  A bone marrow harvest kit was utilized from [employer]  Through a small 4 mm incision, an incision was made over the  thickest part of the iliac crest wing on the right hand side.  Skin and subcutaneous tissues were sharply divided.  The needle was then tapped into the iliac crest between the inner and outer table.  A bone marrow aspiration was performed and prepared on  the back table using centrifugation for BMAC.  At this time, the incision was irrigated and anesthetized with a combination of Marcaine, morphine, clonidine, and was closed using 3-0 simple nylon sutures x2.  Impervious dressing was placed.  Next, the  leg was elevated and exsanguinated with the  Esmarch wrap.  Tourniquet was inflated.  Anterior approach to knee was made.  Skin and subcutaneous tissue were sharply divided.  Fat pad was partially excised.  The arthrotomy continued around the superior  medial border of the patella. Loose body was removed from the suprapatellar pouch, which required some manipulation.  Rest of the knee looked good in terms of lateral and patellofemoral joint wear.  The patient did have an osteochondral defect on the  medial femoral condyle, which was concave.  First, the size of this lesion was best fitted with a 20 mm diameter circular plug.  This was marked on the femur.  Next, the similar location was marked on the donor graft.  At this time, a guide pin was  placed onto the patient's native femur and reaming was performed.  The depth was approximately 8 mm, but specifically measured 8 mm medially, 10 mm laterally, and 9 mm north and Gray.  Next, the allograft was then prepared using a circumferential bone  plug harvester, which was drilled down to approximately 15 mm.  The bone plug was then delivered from the allograft and prepared precisely using measurements from the recipient site.  Thorough irrigation was performed in both the recipient site and on  the graft itself.  The ends were beveled.  The alignment on the donor plug was correlated with the alignment on the recipient site.  Very good press fit was obtained.  No grafting necessary.  It should be noted that the bone plug sat within the [facility] for  about 2-3 minutes prior to press fit placement.  The ends were  flushed with the surrounding cartilage.  Overall, a very stable construct was achieved.  Thorough irrigation was performed.  Tourniquet released at this time.  Marcaine, morphine, clonidine  injected into the medial and lateral aspect of the incision.  Incision was then closed using #1 Vicryl suture followed by interrupted inverted 0 Vicryl suture, 2-0 Vicryl suture, and 3-0 Monocryl with Steri-Strips  and Aquacel dressing applied. Bulky  immobilizer was placed.  The patient tolerated the procedure well without immediate complication and was transferred to the recovery room in stable condition.  Luke's assistance was required all times for retraction, opening, closing, mobilization of tissue.  His assistance was a medical necessity.   ROH D: 05/14/2021 2:12:38 pm T: 05/14/2021 5:39:00 pm  JOB: [id number]/ [id number]

## 2021-05-16 ENCOUNTER — Encounter (HOSPITAL_BASED_OUTPATIENT_CLINIC_OR_DEPARTMENT_OTHER): Payer: Self-pay | Admitting: Orthopedic Surgery

## 2021-05-21 ENCOUNTER — Other Ambulatory Visit: Payer: Self-pay

## 2021-05-21 ENCOUNTER — Telehealth: Payer: Self-pay

## 2021-05-21 ENCOUNTER — Ambulatory Visit (HOSPITAL_COMMUNITY)
Admission: RE | Admit: 2021-05-21 | Discharge: 2021-05-21 | Disposition: A | Discharge status: Home / Self Care | Payer: Federal, State, Local not specified - PPO | Source: Ambulatory Visit | Attending: Orthopedic Surgery | Admitting: Orthopedic Surgery

## 2021-05-21 ENCOUNTER — Encounter: Payer: Self-pay | Admitting: Orthopedic Surgery

## 2021-05-21 ENCOUNTER — Ambulatory Visit (INDEPENDENT_AMBULATORY_CARE_PROVIDER_SITE_OTHER): Payer: Federal, State, Local not specified - PPO | Admitting: Surgical

## 2021-05-21 DIAGNOSIS — M958 Other specified acquired deformities of musculoskeletal system: Secondary | ICD-10-CM

## 2021-05-21 DIAGNOSIS — G8918 Other acute postprocedural pain: Secondary | ICD-10-CM | POA: Insufficient documentation

## 2021-05-21 NOTE — Progress Notes (Signed)
Post-Op Visit Note   Patient: Michele [name]           Date of Birth: Dec 25, 1975           MRN: [id number] Visit Date: 05/21/2021 PCP: Caren Macadam, MD   Assessment & Plan:  Chief Complaint:  Chief Complaint  Patient presents with   Right Knee - Routine Post Op   Visit Diagnoses:  1. Osteochondral defect of femoral condyle   2. Post-op pain     Plan: Patient is a 46 year old female who presents s/p right knee osteochondral allograft for medial femoral condyle defect with bone marrow aspirate from iliac crest.  She is about a week out from surgery.  Reports she is doing well overall.  No hip pain.  She is taking oxycodone about 1-2 times per day for pain control.  Taking Xarelto for DVT prophylaxis as her mother has a history of blood clot.  She denies any chest pain, shortness of breath, calf pain.  Does report numbness around the lateral distal part of the incision.  She has not able to lift her leg.  She has been nonweightbearing since surgery and has kept her leg straight with no flexion.  On exam, incision is healing well without any evidence of infection or dehiscence.  Sutures removed from the small iliac crest incision and reapproximated with Steri-Strips.  She is not able to perform straight leg raise today; quad fires intermittently.  No calf tenderness.  Negative Homans' sign.  She has range of motion from 0 degrees of extension to 40 to 45 degrees of knee flexion.  Right foot is warm and well-perfused.  Plan is continue with nonweightbearing status.  Okay to flex from 0 to 90 degrees.  She will be set up for CPM machine.  Plan for vascular ultrasound today or tomorrow to rule out DVT and if negative for DVT, plan to transition from Xarelto to aspirin once daily.  She will monitor herself for the signs and symptoms of DVT/PE which were discussed with her today.  Follow-up in 3 weeks for clinical recheck regarding her range of motion and initiation of weightbearing at  that time.  She will continue to try to do straight leg raises as the more quad strength she has leading into weightbearing, the easier it will be to transition from nonweightbearing.  Follow-Up Instructions: No follow-ups on file.   Orders:  No orders of the defined types were placed in this encounter.  No orders of the defined types were placed in this encounter.   Imaging: No results found.  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   Nonallopathic lesion of lumbar region 09/09/2019   Nonallopathic lesion of sacral region 09/09/2019   Nonallopathic lesion of thoracic region 09/09/2019   Nonallopathic lesion of rib cage 09/09/2019   Nonallopathic lesion of cervical region 09/09/2019   Acute bursitis of left shoulder 09/09/2019   Cholecystitis, acute with cholelithiasis 05/09/2016   Gynecomastia, female 02/03/2013   Back pain, acute 06/22/2012   Obesity 05/25/2010   Essential hypertension 05/25/2010   Past Medical History:  Diagnosis Date   Diabetes mellitus without complication ([place])    Hypertension    under control with med., has been on med. x 2 yr.   Hypertrophy of breast 03/2013   Obesity     Family History  Problem Relation Age of  Onset   Migraines Mother    Arthritis Mother    Deep vein thrombosis Mother        started after ortho surgery; lifetime   Hyperlipidemia Mother    Cancer Father 50       Prostate   Heart disease Father        bypass   Heart attack Father 26   Hypertension Father    Headache Brother    Other Brother        ?something with heart; under eval   Glaucoma Maternal Grandmother    Head & neck cancer Maternal Grandmother    Alzheimer's disease Maternal Grandfather    Cancer Paternal Grandmother        blood?   Cancer Paternal Grandfather        prostate   Heart attack Paternal Uncle 26   Thyroid  disease Neg Hx     Past Surgical History:  Procedure Laterality Date   ABDOMINAL HYSTERECTOMY N/A 04/13/2020   Procedure: HYSTERECTOMY ABDOMINAL;  Surgeon: Delsa Bern, MD;  Location: [place];  Service: Gynecology;  Laterality: N/A;  AUTO INFUSION PROTOCOL   BILATERAL SALPINGECTOMY Bilateral 04/13/2020   Procedure: OPEN BILATERAL SALPINGECTOMY;  Surgeon: Delsa Bern, MD;  Location: [place];  Service: Gynecology;  Laterality: Bilateral;   BREAST REDUCTION SURGERY Bilateral 04/26/2013   Procedure: BILATERAL BREAST REDUCTION WITH LIPOSUCTION;  Surgeon: Cristine Polio, MD;  Location: [place];  Service: Plastics;  Laterality: Bilateral;   CHOLECYSTECTOMY N/A 05/10/2016   Procedure: LAPAROSCOPIC CHOLECYSTECTOMY WITH INTRAOPERATIVE CHOLANGIOGRAM;  Surgeon: Donnie Mesa, MD;  Location: [place] [facility];  Service: General;  Laterality: N/A;   FOOT SURGERY  2003; 2004   each foot - bunion, hammer toe   OSTEOCHONDRAL DEFECT REPAIR/RECONSTRUCTION Right 05/14/2021   Procedure: right knee osteochondral open allograft fro medial femoral defect;  Surgeon: Meredith Pel, MD;  Location: [place];  Service: Orthopedics;  Laterality: Right;   Social History   Occupational History   Not on file  Tobacco Use   Smoking status: Never   Smokeless tobacco: Never  Vaping Use   Vaping Use: Never used  Substance and Sexual Activity   Alcohol use: Yes    Comment: rarely   Drug use: No   Sexual activity: Yes    Birth control/protection: I.U.D.

## 2021-05-21 NOTE — Telephone Encounter (Signed)
Felipa Emory with [facility] called stating that patient is Negative for DVT, right leg.  Please advise.  Thank you.

## 2021-05-21 NOTE — Telephone Encounter (Signed)
Can we please get in touch with [employer] about getting CPM for patient? 05/14/21 right knee osteochondral open allograft medial femoral defect

## 2021-05-21 NOTE — Telephone Encounter (Signed)
Order sent.

## 2021-05-21 NOTE — Telephone Encounter (Signed)
Called patient. She will continue Xarelto for 7 days and then switch to ASA 81mg  BID and dc xarelto

## 2021-05-22 DIAGNOSIS — M93261 Osteochondritis dissecans, right knee: Secondary | ICD-10-CM | POA: Diagnosis not present

## 2021-06-02 DIAGNOSIS — M93261 Osteochondritis dissecans, right knee: Secondary | ICD-10-CM

## 2021-06-10 ENCOUNTER — Other Ambulatory Visit: Payer: Self-pay | Admitting: Surgical

## 2021-06-13 ENCOUNTER — Ambulatory Visit (INDEPENDENT_AMBULATORY_CARE_PROVIDER_SITE_OTHER): Payer: Federal, State, Local not specified - PPO | Admitting: Orthopedic Surgery

## 2021-06-13 ENCOUNTER — Other Ambulatory Visit: Payer: Self-pay

## 2021-06-13 ENCOUNTER — Encounter: Payer: Self-pay | Admitting: Orthopedic Surgery

## 2021-06-13 DIAGNOSIS — M958 Other specified acquired deformities of musculoskeletal system: Secondary | ICD-10-CM

## 2021-06-13 NOTE — Progress Notes (Signed)
Post-Op Visit Note   Patient: Michele [name]           Date of Birth: January 31, 1976           MRN: [id number] Visit Date: 06/13/2021 PCP: Caren Macadam, MD   Assessment & Plan:  Chief Complaint:  Chief Complaint  Patient presents with   Right Knee - Routine Post Op   Visit Diagnoses:  1. Osteochondral defect of femoral condyle     Plan: Patient is a 46 year old female who presents right knee open osteochondral allograft for OCD lesion on 05/14/2021.  Patient states that she has been having significant improvement in her right knee pain.  She has been using CPM machine 3 times per day for 1.5 hours each time from 0 to 90 degrees.  Pain is significantly improved and she does not have to take any medications for pain.  She has transition off of Xarelto to aspirin twice per day.  Denies any chest pain or shortness of breath.  No complaint of calf pain.  She mainly complains of initial tightness in the front of her knee when she is in the CPM machine but this only lasts for a few minutes.  She also has some increased sensitivity around the incision but otherwise her knee is doing quite well.  She has not been putting any weight on her operative leg and has been ambulating with crutches and a knee immobilizer.  Ortho exam demonstrates right knee with no effusion.  Incision is well-healed from prior surgery.  No calf tenderness.  Negative Homans' sign.  Able to perform straight leg raise with 5/5 quad strength.  No extensor lag noted.  Range of motion from 5 degrees extension to 95 degrees of knee flexion.  No crepitus noted with passive motion of the knee.  Plan is to start partial weightbearing with 2 crutches and then transition from 2 crutches to 1 crutch to no crutches over the next 1 to 2 weeks as she can tolerate.  She has excellent quad strength so she can discontinue the knee immobilizer altogether at this point.  She will be started with physical therapy upstairs to work on range  of motion, quadricep strengthening, gait training.  Follow-up in 4 weeks for clinical recheck.  We will determine when she returns to work at her next appointment in 4 weeks.  She will call the office with any concerns in the meantime.  Possible radiographs at that time  Follow-Up Instructions: No follow-ups on file.   Orders:  No orders of the defined types were placed in this encounter.  No orders of the defined types were placed in this encounter.   Imaging: No results found.  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   Nonallopathic lesion of lumbar region 09/09/2019   Nonallopathic lesion of sacral region 09/09/2019   Nonallopathic lesion of thoracic region 09/09/2019   Nonallopathic lesion of rib cage 09/09/2019   Nonallopathic lesion of cervical region 09/09/2019   Acute bursitis of left shoulder 09/09/2019   Cholecystitis, acute with cholelithiasis 05/09/2016   Gynecomastia, female 02/03/2013   Back pain, acute 06/22/2012   Obesity 05/25/2010   Essential hypertension 05/25/2010   Past Medical History:  Diagnosis Date   Diabetes mellitus without complication ([place])    Hypertension    under control with  med., has been on med. x 2 yr.   Hypertrophy of breast 03/2013   Obesity     Family History  Problem Relation Age of Onset   Migraines Mother    Arthritis Mother    Deep vein thrombosis Mother        started after ortho surgery; lifetime   Hyperlipidemia Mother    Cancer Father 62       Prostate   Heart disease Father        bypass   Heart attack Father 38   Hypertension Father    Headache Brother    Other Brother        ?something with heart; under eval   Glaucoma Maternal Grandmother    Head & neck cancer Maternal Grandmother    Alzheimer's disease Maternal Grandfather    Cancer  Paternal Grandmother        blood?   Cancer Paternal Grandfather        prostate   Heart attack Paternal Uncle 17   Thyroid disease Neg Hx     Past Surgical History:  Procedure Laterality Date   ABDOMINAL HYSTERECTOMY N/A 04/13/2020   Procedure: HYSTERECTOMY ABDOMINAL;  Surgeon: Delsa Bern, MD;  Location: [place];  Service: Gynecology;  Laterality: N/A;  AUTO INFUSION PROTOCOL   BILATERAL SALPINGECTOMY Bilateral 04/13/2020   Procedure: OPEN BILATERAL SALPINGECTOMY;  Surgeon: Delsa Bern, MD;  Location: [place];  Service: Gynecology;  Laterality: Bilateral;   BREAST REDUCTION SURGERY Bilateral 04/26/2013   Procedure: BILATERAL BREAST REDUCTION WITH LIPOSUCTION;  Surgeon: Cristine Polio, MD;  Location: [place];  Service: Plastics;  Laterality: Bilateral;   CHOLECYSTECTOMY N/A 05/10/2016   Procedure: LAPAROSCOPIC CHOLECYSTECTOMY WITH INTRAOPERATIVE CHOLANGIOGRAM;  Surgeon: Donnie Mesa, MD;  Location: [place] [facility];  Service: General;  Laterality: N/A;   FOOT SURGERY  2003; 2004   each foot - bunion, hammer toe   OSTEOCHONDRAL DEFECT REPAIR/RECONSTRUCTION Right 05/14/2021   Procedure: right knee osteochondral open allograft fro medial femoral defect;  Surgeon: Meredith Pel, MD;  Location: [place];  Service: Orthopedics;  Laterality: Right;   Social History   Occupational History   Not on file  Tobacco Use   Smoking status: Never   Smokeless tobacco: Never  Vaping Use   Vaping Use: Never used  Substance and Sexual Activity   Alcohol use: Yes    Comment: rarely   Drug use: No   Sexual activity: Yes    Birth control/protection: I.U.D.

## 2021-06-14 ENCOUNTER — Encounter: Payer: Self-pay | Admitting: Orthopedic Surgery

## 2021-06-20 ENCOUNTER — Other Ambulatory Visit: Payer: Self-pay

## 2021-06-20 DIAGNOSIS — M958 Other specified acquired deformities of musculoskeletal system: Secondary | ICD-10-CM

## 2021-06-21 ENCOUNTER — Encounter: Payer: Self-pay | Admitting: Physical Therapy

## 2021-06-21 ENCOUNTER — Ambulatory Visit (INDEPENDENT_AMBULATORY_CARE_PROVIDER_SITE_OTHER): Payer: Federal, State, Local not specified - PPO | Admitting: Physical Therapy

## 2021-06-21 ENCOUNTER — Other Ambulatory Visit: Payer: Self-pay

## 2021-06-21 DIAGNOSIS — M25661 Stiffness of right knee, not elsewhere classified: Secondary | ICD-10-CM

## 2021-06-21 DIAGNOSIS — M25561 Pain in right knee: Secondary | ICD-10-CM

## 2021-06-21 DIAGNOSIS — R600 Localized edema: Secondary | ICD-10-CM

## 2021-06-21 DIAGNOSIS — R2689 Other abnormalities of gait and mobility: Secondary | ICD-10-CM

## 2021-06-21 DIAGNOSIS — M6281 Muscle weakness (generalized): Secondary | ICD-10-CM | POA: Diagnosis not present

## 2021-06-21 NOTE — Therapy (Signed)
OUTPATIENT PHYSICAL THERAPY LOWER EXTREMITY EVALUATION   Patient Name: Michele [name] MRN: [id number] DOB:07/14/1975, 46 y.o., female Today's Date: 06/21/2021   PT End of Session - 06/21/21 1337     Visit Number 1    Number of Visits 16    Date for PT Re-Evaluation 08/16/21    PT Start Time 1300    PT Stop Time 1337    PT Time Calculation (min) 37 min    Activity Tolerance Patient tolerated treatment well    Behavior During Therapy [facility] for tasks assessed/performed             Past Medical History:  Diagnosis Date   Diabetes mellitus without complication ([place])    Hypertension    under control with med., has been on med. x 2 yr.   Hypertrophy of breast 03/2013   Obesity    Past Surgical History:  Procedure Laterality Date   ABDOMINAL HYSTERECTOMY N/A 04/13/2020   Procedure: HYSTERECTOMY ABDOMINAL;  Surgeon: Delsa Bern, MD;  Location: [place];  Service: Gynecology;  Laterality: N/A;  AUTO INFUSION PROTOCOL   BILATERAL SALPINGECTOMY Bilateral 04/13/2020   Procedure: OPEN BILATERAL SALPINGECTOMY;  Surgeon: Delsa Bern, MD;  Location: [place];  Service: Gynecology;  Laterality: Bilateral;   BREAST REDUCTION SURGERY Bilateral 04/26/2013   Procedure: BILATERAL BREAST REDUCTION WITH LIPOSUCTION;  Surgeon: Cristine Polio, MD;  Location: [place];  Service: Plastics;  Laterality: Bilateral;   CHOLECYSTECTOMY N/A 05/10/2016   Procedure: LAPAROSCOPIC CHOLECYSTECTOMY WITH INTRAOPERATIVE CHOLANGIOGRAM;  Surgeon: Donnie Mesa, MD;  Location: [place] [facility];  Service: General;  Laterality: N/A;   FOOT SURGERY  2003; 2004   each foot - bunion, hammer toe   OSTEOCHONDRAL DEFECT REPAIR/RECONSTRUCTION Right 05/14/2021   Procedure: right knee osteochondral open allograft fro medial femoral defect;  Surgeon: Meredith Pel, MD;  Location: [place];  Service: Orthopedics;  Laterality: Right;   Patient Active Problem List   Diagnosis Date Noted    Osteochondritis dissecans of right knee    Multinodular goiter 12/31/2020   Fibroids 04/03/2020   Patellofemoral syndrome of right knee 11/24/2019   Nonallopathic lesion of lumbar region 09/09/2019   Nonallopathic lesion of sacral region 09/09/2019   Nonallopathic lesion of thoracic region 09/09/2019   Nonallopathic lesion of rib cage 09/09/2019   Nonallopathic lesion of cervical region 09/09/2019   Acute bursitis of left shoulder 09/09/2019   Cholecystitis, acute with cholelithiasis 05/09/2016   Gynecomastia, female 02/03/2013   Back pain, acute 06/22/2012   Obesity 05/25/2010   Essential hypertension 05/25/2010    PCP: Caren Macadam, MD  REFERRING PROVIDER: Meredith Pel, MD  REFERRING DIAG: M95.8 (ICD-10-CM) - Osteochondral defect of femoral condyle   THERAPY DIAG:  Acute pain of right knee - Plan: PT plan of care cert/re-cert  Stiffness of right knee, not elsewhere classified - Plan: PT plan of care cert/re-cert  Other abnormalities of gait and mobility - Plan: PT plan of care cert/re-cert  Muscle weakness (generalized) - Plan: PT plan of care cert/re-cert  Localized edema - Plan: PT plan of care cert/re-cert  ONSET DATE: 1/95/09  SUBJECTIVE:   SUBJECTIVE STATEMENT: Pt is a 46 y/o female who presents to OPPT s/p Rt knee osteochondral defect.  She had CPM machine getting up to 114 deg, but she is finished using this.    PERTINENT HISTORY: DM, HTN, obesity  PAIN:  Are you having pain? No, c/o tightness NPRS scale: 0/10, up to 2-3/10 Pain location: knee Pain  orientation: Right  PAIN TYPE: aching and dull Pain description: intermittent  Aggravating factors: weightbearing, walking Relieving factors: rest, elevation  PRECAUTIONS: Fall  WEIGHT BEARING RESTRICTIONS Yes RLE WBAT  FALLS:  Has patient fallen in last 6 months? No, Number of falls: N/A  LIVING ENVIRONMENT: Lives with: lives with their family (27 and 3 y/o children) Lives in:  House/apartment Stairs: Yes; Internal: 14 steps; on left going up and External: 1 steps; none Has following equipment at home: Crutches  OCCUPATION: currently out of work, full time auditor; seated desk work  PLOF: Independent and Leisure: spend time with family, girl Acupuncturist, church, prior to sx: walking 5 days/wk  PATIENT GOALS improve mobility, regain prior level of function   OBJECTIVE:   DIAGNOSTIC FINDINGS: MRI: 1.5 cm AP x 1.0 cm TR osteochondral defect of the mesial central medial femoral condyle with the displaced fragment in the suprapatellar joint space.  PATIENT SURVEYS:  06/21/21 FOTO 40 (predicted 100)  COGNITION:  Overall cognitive status: Within functional limits for tasks assessed     SENSATION:  Light touch: Appears intact   LE AROM/PROM:  A/PROM Right 06/21/2021 Left 06/21/2021  Hip flexion    Hip extension    Hip abduction    Hip adduction    Hip internal rotation    Hip external rotation    Knee flexion A 101 P 107 A 137  Knee extension A -11 (LAQ) P 0 A 5  Ankle dorsiflexion    Ankle plantarflexion    Ankle inversion    Ankle eversion     (Blank rows = not tested)  LE MMT:    Deferred due to post op status MMT Right 06/21/2021 Left 06/21/2021  Hip flexion    Hip extension    Hip abduction    Hip adduction    Hip internal rotation    Hip external rotation    Knee flexion    Knee extension    Ankle dorsiflexion    Ankle plantarflexion    Ankle inversion    Ankle eversion     (Blank rows = not tested)   GAIT: Distance walked: 100' Assistive device utilized: Crutches Level of assistance: Modified independence Comments: adjusted handles to appropriate height; will review transition to single crutch next visit; decreased hip/knee flexion on Rt and decreased stance on Rt.   EDEMA: swelling noted in Rt knee   TODAY'S TREATMENT: 06/21/21:  See HEP - performed trial reps PRN for comprehension   PATIENT EDUCATION:  Education  details: HEP Person educated: Patient Education method: Explanation, Demonstration, and Handouts Education comprehension: verbalized understanding, returned demonstration, and needs further education   HOME EXERCISE PROGRAM: Access Code: [email or web address] URL: [email or web address] Date: 06/21/2021 Prepared by: Faustino Congress  Program Notes [email or web address] : [employer]er Stretching Strap Yoga Strap Physical Therapy for Home Workout, Exercise, Pilates and Gymnastics, 10 Loops Non-Elastic Stretch Bands with Workout Guide for Women & Men. : Sports & Outdoors    Exercises Long Sitting Quad Set - 5-10 x daily - 7 x weekly - 1 sets - 5-10 reps - 5 sec hold Supine Heel Slide with Strap - 5-10 x daily - 7 x weekly - 1 sets - 5-10 reps Seated Knee Extension AROM - 5-10 x daily - 7 x weekly - 1 sets - 5-10 reps - 2-3 sec hold Seated Knee Flexion AAROM - 5-10 x daily - 7 x weekly - 1 sets - 10 reps - 10 sec hold   ASSESSMENT:  CLINICAL IMPRESSION: Patient is  a 46 y.o. female who was seen today for physical therapy evaluation and treatment s/p Rt knee osteochondral open allograft medial femoral defect on 05/14/21.    OBJECTIVE IMPAIRMENTS Abnormal gait, decreased activity tolerance, decreased balance, decreased endurance, decreased knowledge of use of DME, decreased mobility, difficulty walking, decreased ROM, decreased strength, increased edema, and pain.   ACTIVITY LIMITATIONS cleaning, community activity, driving, occupation, laundry, and shopping.   PERSONAL FACTORS 3+ comorbidities: DM, HTN, obesity  are also affecting patient's functional outcome.    REHAB POTENTIAL: Excellent  CLINICAL DECISION MAKING: Evolving/moderate complexity  EVALUATION COMPLEXITY: Moderate   GOALS: Goals reviewed with patient? Yes  SHORT TERM GOALS:  STG Name Target Date Goal status  1 Independent with initial HEP Baseline:  07/19/2021 INITIAL  2 Rt knee AROM improved 0-110 for improved  function Baseline:  07/19/2021 INITIAL   LONG TERM GOALS:   LTG Name Target Date Goal status  1 Independent with final HEP Baseline: 08/16/2021 INITIAL  2 FOTO score improved to 66 for improved function Baseline: 08/16/2021 INITIAL  3 Rt knee ARM improved to 3-0-130 for improved function Baseline: 08/16/2021 INITIAL  4 Report pain < 1/10 with standing/walking for improved activity tolerance  Baseline: 08/16/2021 INITIAL  5 Amb without AD independently without significant deviations for improved function Baseline: 08/16/2021 INITIAL  6 Improve Rt knee strength to 5/5 for improved function Baseline: 08/16/2021 INITIAL     PLAN: PT FREQUENCY: 2x/week  PT DURATION: 8 weeks  PLANNED INTERVENTIONS: Therapeutic exercises, Therapeutic activity, Neuro Muscular re-education, Balance training, Gait training, Patient/Family education, Joint mobilization, Stair training, DME instructions, Dry Needling, Electrical stimulation, Cryotherapy, Moist heat, Taping, Vasopneumatic device, and Manual therapy  PLAN FOR NEXT SESSION: review HEP and progress/update PRN, continue with ROM exercises, light strengthening     Laureen Abrahams, PT, DPT 06/21/21 1:45 PM

## 2021-06-22 ENCOUNTER — Ambulatory Visit: Payer: Federal, State, Local not specified - PPO | Admitting: Family Medicine

## 2021-06-25 ENCOUNTER — Ambulatory Visit: Payer: Federal, State, Local not specified - PPO | Admitting: Physical Therapy

## 2021-06-25 ENCOUNTER — Other Ambulatory Visit: Payer: Self-pay

## 2021-06-25 ENCOUNTER — Encounter: Payer: Self-pay | Admitting: Physical Therapy

## 2021-06-25 DIAGNOSIS — R2689 Other abnormalities of gait and mobility: Secondary | ICD-10-CM | POA: Diagnosis not present

## 2021-06-25 DIAGNOSIS — M25561 Pain in right knee: Secondary | ICD-10-CM

## 2021-06-25 DIAGNOSIS — M6281 Muscle weakness (generalized): Secondary | ICD-10-CM | POA: Diagnosis not present

## 2021-06-25 DIAGNOSIS — R600 Localized edema: Secondary | ICD-10-CM

## 2021-06-25 DIAGNOSIS — M25661 Stiffness of right knee, not elsewhere classified: Secondary | ICD-10-CM | POA: Diagnosis not present

## 2021-06-25 NOTE — Therapy (Signed)
OUTPATIENT PHYSICAL THERAPY TREATMENT NOTE   Patient Name: Michele [name] MRN: [id number] DOB:[birth year], 46 y.o., female Today's Date: 06/25/2021  PCP: Caren Macadam, MD REFERRING PROVIDER: Meredith Pel, MD   PT End of Session - 06/25/21 1345     Visit Number 2    Number of Visits 16    Date for PT Re-Evaluation 08/16/21    PT Start Time 3810    PT Stop Time 1442    PT Time Calculation (min) 57 min    Activity Tolerance Patient tolerated treatment well    Behavior During Therapy [facility] for tasks assessed/performed             Past Medical History:  Diagnosis Date   Diabetes mellitus without complication ([place])    Hypertension    under control with med., has been on med. x 2 yr.   Hypertrophy of breast 03/2013   Obesity    Past Surgical History:  Procedure Laterality Date   ABDOMINAL HYSTERECTOMY N/A 04/13/2020   Procedure: HYSTERECTOMY ABDOMINAL;  Surgeon: Delsa Bern, MD;  Location: [place];  Service: Gynecology;  Laterality: N/A;  AUTO INFUSION PROTOCOL   BILATERAL SALPINGECTOMY Bilateral 04/13/2020   Procedure: OPEN BILATERAL SALPINGECTOMY;  Surgeon: Delsa Bern, MD;  Location: [place];  Service: Gynecology;  Laterality: Bilateral;   BREAST REDUCTION SURGERY Bilateral 04/26/2013   Procedure: BILATERAL BREAST REDUCTION WITH LIPOSUCTION;  Surgeon: Cristine Polio, MD;  Location: [place];  Service: Plastics;  Laterality: Bilateral;   CHOLECYSTECTOMY N/A 05/10/2016   Procedure: LAPAROSCOPIC CHOLECYSTECTOMY WITH INTRAOPERATIVE CHOLANGIOGRAM;  Surgeon: Donnie Mesa, MD;  Location: [place] [facility];  Service: General;  Laterality: N/A;   FOOT SURGERY  2003; 2004   each foot - bunion, hammer toe   OSTEOCHONDRAL DEFECT REPAIR/RECONSTRUCTION Right 05/14/2021   Procedure: right knee osteochondral open allograft fro medial femoral defect;  Surgeon: Meredith Pel, MD;  Location: [place];  Service: Orthopedics;  Laterality: Right;    Patient Active Problem List   Diagnosis Date Noted   Osteochondritis dissecans of right knee    Multinodular goiter 12/31/2020   Fibroids 04/03/2020   Patellofemoral syndrome of right knee 11/24/2019   Nonallopathic lesion of lumbar region 09/09/2019   Nonallopathic lesion of sacral region 09/09/2019   Nonallopathic lesion of thoracic region 09/09/2019   Nonallopathic lesion of rib cage 09/09/2019   Nonallopathic lesion of cervical region 09/09/2019   Acute bursitis of left shoulder 09/09/2019   Cholecystitis, acute with cholelithiasis 05/09/2016   Gynecomastia, female 02/03/2013   Back pain, acute 06/22/2012   Obesity 05/25/2010   Essential hypertension 05/25/2010    REFERRING DIAG: M95.8 (ICD-10-CM) - Osteochondral defect of femoral condyle   THERAPY DIAG:  Acute pain of right knee  Stiffness of right knee, not elsewhere classified  Other abnormalities of gait and mobility  Muscle weakness (generalized)  Localized edema  PERTINENT HISTORY: DM, HTN, obesity  PRECAUTIONS: Fall  SUBJECTIVE:  She had difficulty with quad set exercise but other 3 had no issues.   PAIN:  Are you having pain? Yes NPRS scale: 2/10 Pain location: knee Pain orientation: Right and Anterior  PAIN TYPE: tight Pain description: intermittent  Aggravating factors: letting knee stay in one position Relieving factors: moving knee  OBJECTIVE:    DIAGNOSTIC FINDINGS: MRI: 1.5 cm AP x 1.0 cm TR osteochondral defect of the mesial central medial femoral condyle with the displaced fragment in the suprapatellar joint space.   PATIENT SURVEYS:  06/21/21 FOTO 40 (  predicted 66)   LE AROM/PROM:   A/PROM Right 06/21/2021 Left 06/21/2021  Hip flexion      Hip extension      Hip abduction      Hip adduction      Hip internal rotation      Hip external rotation      Knee flexion A 101 P 107 A 137  Knee extension A -11 (LAQ) P 0 A 5  Ankle dorsiflexion      Ankle plantarflexion      Ankle  inversion      Ankle eversion       (Blank rows = not tested)   LE MMT:                               Deferred due to post op status MMT Right 06/21/2021 Left 06/21/2021  Hip flexion      Hip extension      Hip abduction      Hip adduction      Hip internal rotation      Hip external rotation      Knee flexion      Knee extension      Ankle dorsiflexion      Ankle plantarflexion      Ankle inversion      Ankle eversion       (Blank rows = not tested)     GAIT: 06/21/2021 Distance walked: 100' Assistive device utilized: Crutches Level of assistance: Modified independence Comments: adjusted handles to appropriate height; will review transition to single crutch next visit; decreased hip/knee flexion on Rt and decreased stance on Rt.             EDEMA: 06/21/2021  swelling noted in Rt knee     TODAY'S TREATMENT: 06/25/2021 Therapeutic Exercise: Aerobic: recumbent bike seat 7 level 1 for 6 min Stretches:  Supine: AA heel slides with strap 20 reps RLE SLR 2 sec hold, slow eccentric, 10 reps, 2 sets Prone: Seated: long sit quad set towel under ankle 5 sec hold 20 reps; LAQ no wt 5 sec hold alt reps w/ankle DF 10 reps 2 sets;  Standing:  knee flexion no wt 10 reps 2 sets. Heel raises BLEs 5 sec hold 10 reps 2 sets.  Rocker board square without UE support ant w/knee flex & post w/knee ext 1 min; right / left wt shift with knee flex/ext 1 min; Neuromuscular Re-education:  tandem stance on foam beam 30 sec RLE in front & 30 sec RLE in back.  Standing on foam with light UE support - RLE stance tapping LLE 3 cones lateral, ant, across midline 10 reps.  Manual Therapy: Therapeutic Activity: Gait: ambulated between areas in clinic without AD with guarded pattern Self Care:  PT instructed in using pillow in sidelying & "tenting" sheets off feet.  Pt verbalized understanding.  Trigger Point Dry Needling:  Modalities: Vasopneumatic right knee medium compression 10 min 34*   06/21/21:   See HEP - performed trial reps PRN for comprehension     PATIENT EDUCATION:  Education details: HEP Person educated: Patient Education method: Explanation, Demonstration, and Handouts Education comprehension: verbalized understanding, returned demonstration, and needs further education     HOME EXERCISE PROGRAM: Access Code: [email or web address] URL: [email or web address] Date: 06/21/2021 Prepared by: Faustino Congress   Program Notes [email or web address] : [employer] Stretching Strap Yoga Strap Physical Therapy for Home Workout, Exercise, Pilates and Gymnastics, 10 Loops  Non-Elastic Stretch Bands with Workout Guide for Women & Men. : Sports & Outdoors       Exercises Long Sitting Quad Set - 5-10 x daily - 7 x weekly - 1 sets - 5-10 reps - 5 sec hold Supine Heel Slide with Strap - 5-10 x daily - 7 x weekly - 1 sets - 5-10 reps Seated Knee Extension AROM - 5-10 x daily - 7 x weekly - 1 sets - 5-10 reps - 2-3 sec hold Seated Knee Flexion AAROM - 5-10 x daily - 7 x weekly - 1 sets - 10 reps - 10 sec hold     ASSESSMENT:   CLINICAL IMPRESSION: PT progressed functional exercises in clinic which she tolerated well.  HEP quad set exercise caused some discomfort so PT did not send any new exercises home yet to see how she responds to exercises in PT today.  She improved control with PT instruction & repetition.    OBJECTIVE IMPAIRMENTS Abnormal gait, decreased activity tolerance, decreased balance, decreased endurance, decreased knowledge of use of DME, decreased mobility, difficulty walking, decreased ROM, decreased strength, increased edema, and pain.    ACTIVITY LIMITATIONS cleaning, community activity, driving, occupation, laundry, and shopping.    PERSONAL FACTORS 3+ comorbidities: DM, HTN, obesity  are also affecting patient's functional outcome.    REHAB POTENTIAL: Excellent   CLINICAL DECISION MAKING: Evolving/moderate complexity   EVALUATION COMPLEXITY: Moderate   GOALS: Goals  reviewed with patient? Yes   SHORT TERM GOALS:   STG Name Target Date Goal status  1 Independent with initial HEP Baseline:  07/19/2021 INITIAL  2 Rt knee AROM improved 0-110 for improved function Baseline:  07/19/2021 INITIAL    LONG TERM GOALS:    LTG Name Target Date Goal status  1 Independent with final HEP Baseline: 08/16/2021 INITIAL  2 FOTO score improved to 66 for improved function Baseline: 08/16/2021 INITIAL  3 Rt knee ARM improved to 3-0-130 for improved function Baseline: 08/16/2021 INITIAL  4 Report pain < 1/10 with standing/walking for improved activity tolerance  Baseline: 08/16/2021 INITIAL  5 Amb without AD independently without significant deviations for improved function Baseline: 08/16/2021 INITIAL  6 Improve Rt knee strength to 5/5 for improved function Baseline: 08/16/2021 INITIAL     PLAN: PT FREQUENCY: 2x/week   PT DURATION: 8 weeks   PLANNED INTERVENTIONS: Therapeutic exercises, Therapeutic activity, Neuro Muscular re-education, Balance training, Gait training, Patient/Family education, Joint mobilization, Stair training, DME instructions, Dry Needling, Electrical stimulation, Cryotherapy, Moist heat, Taping, Vasopneumatic device, and Manual therapy   PLAN FOR NEXT SESSION: check if knee pain increased with exercises in PT, add to HEP if no issues, progress light exercises including balance & function,  end session with vaso.   Jamey Reas, PT, DPT 06/25/2021, 3:40 PM

## 2021-06-27 ENCOUNTER — Other Ambulatory Visit: Payer: Self-pay

## 2021-06-27 ENCOUNTER — Encounter: Payer: Self-pay | Admitting: Physical Therapy

## 2021-06-27 ENCOUNTER — Ambulatory Visit: Payer: Federal, State, Local not specified - PPO | Admitting: Physical Therapy

## 2021-06-27 DIAGNOSIS — M25661 Stiffness of right knee, not elsewhere classified: Secondary | ICD-10-CM

## 2021-06-27 DIAGNOSIS — M6281 Muscle weakness (generalized): Secondary | ICD-10-CM | POA: Diagnosis not present

## 2021-06-27 DIAGNOSIS — M25561 Pain in right knee: Secondary | ICD-10-CM

## 2021-06-27 DIAGNOSIS — R2689 Other abnormalities of gait and mobility: Secondary | ICD-10-CM

## 2021-06-27 DIAGNOSIS — R600 Localized edema: Secondary | ICD-10-CM

## 2021-06-27 NOTE — Therapy (Signed)
OUTPATIENT PHYSICAL THERAPY TREATMENT NOTE   Patient Name: Michele [name] MRN: [id number] DOB:01-15-1976, 46 y.o., female Today's Date: 06/27/2021  PCP: Caren Macadam, MD REFERRING PROVIDER: Meredith Pel, MD   PT End of Session - 06/27/21 1250     Visit Number 3    Number of Visits 16    Date for PT Re-Evaluation 08/16/21    PT Start Time 1300    PT Stop Time 1350    PT Time Calculation (min) 50 min    Activity Tolerance Patient tolerated treatment well    Behavior During Therapy WFL for tasks assessed/performed              Past Medical History:  Diagnosis Date   Diabetes mellitus without complication ([place])    Hypertension    under control with med., has been on med. x 2 yr.   Hypertrophy of breast 03/2013   Obesity    Past Surgical History:  Procedure Laterality Date   ABDOMINAL HYSTERECTOMY N/A 04/13/2020   Procedure: HYSTERECTOMY ABDOMINAL;  Surgeon: Delsa Bern, MD;  Location: [place];  Service: Gynecology;  Laterality: N/A;  AUTO INFUSION PROTOCOL   BILATERAL SALPINGECTOMY Bilateral 04/13/2020   Procedure: OPEN BILATERAL SALPINGECTOMY;  Surgeon: Delsa Bern, MD;  Location: [place];  Service: Gynecology;  Laterality: Bilateral;   BREAST REDUCTION SURGERY Bilateral 04/26/2013   Procedure: BILATERAL BREAST REDUCTION WITH LIPOSUCTION;  Surgeon: Cristine Polio, MD;  Location: [place];  Service: Plastics;  Laterality: Bilateral;   CHOLECYSTECTOMY N/A 05/10/2016   Procedure: LAPAROSCOPIC CHOLECYSTECTOMY WITH INTRAOPERATIVE CHOLANGIOGRAM;  Surgeon: Donnie Mesa, MD;  Location: [facility];  Service: General;  Laterality: N/A;   FOOT SURGERY  2003; 2004   each foot - bunion, hammer toe   OSTEOCHONDRAL DEFECT REPAIR/RECONSTRUCTION Right 05/14/2021   Procedure: right knee osteochondral open allograft fro medial femoral defect;  Surgeon: Meredith Pel, MD;  Location: [place];  Service: Orthopedics;  Laterality: Right;    Patient Active Problem List   Diagnosis Date Noted   Osteochondritis dissecans of right knee    Multinodular goiter 12/31/2020   Fibroids 04/03/2020   Patellofemoral syndrome of right knee 11/24/2019   Nonallopathic lesion of lumbar region 09/09/2019   Nonallopathic lesion of sacral region 09/09/2019   Nonallopathic lesion of thoracic region 09/09/2019   Nonallopathic lesion of rib cage 09/09/2019   Nonallopathic lesion of cervical region 09/09/2019   Acute bursitis of left shoulder 09/09/2019   Cholecystitis, acute with cholelithiasis 05/09/2016   Gynecomastia, female 02/03/2013   Back pain, acute 06/22/2012   Obesity 05/25/2010   Essential hypertension 05/25/2010    REFERRING DIAG: M95.8 (ICD-10-CM) - Osteochondral defect of femoral condyle   THERAPY DIAG:  Acute pain of right knee  Stiffness of right knee, not elsewhere classified  Other abnormalities of gait and mobility  Muscle weakness (generalized)  Localized edema  PERTINENT HISTORY: DM, HTN, obesity  PRECAUTIONS: Fall  SUBJECTIVE: She was a little sore after PT but it did not last or impair her activities.   PAIN:  Are you having pain? Yes NPRS scale: 2/10 in last week, lowest 0/10 sitting resting & highest 3/10 Pain location: knee Pain orientation: Right and Anterior  PAIN TYPE: tight Pain description: intermittent  Aggravating factors: letting knee stay in one position, when first gets up after sitting Relieving factors: moving knee  OBJECTIVE:    DIAGNOSTIC FINDINGS: MRI: 1.5 cm AP x 1.0 cm TR osteochondral defect of the mesial central medial  femoral condyle with the displaced fragment in the suprapatellar joint space.   PATIENT SURVEYS:  06/21/21 FOTO 40 (predicted 66)   LE AROM/PROM:   A/PROM Right 06/21/21 Left 06/21/21  Hip flexion      Hip extension      Hip abduction      Hip adduction      Hip internal rotation      Hip external rotation      Knee flexion A 101 P 107 A 137   Knee extension A -11 (LAQ) P 0 A 5  Ankle dorsiflexion      Ankle plantarflexion      Ankle inversion      Ankle eversion      (Blank rows = not tested)   LE MMT:                               Deferred due to post op status MMT Right 06/21/21 Left 06/21/21  Hip flexion      Hip extension      Hip abduction      Hip adduction      Hip internal rotation      Hip external rotation      Knee flexion      Knee extension      Ankle dorsiflexion      Ankle plantarflexion      Ankle inversion      Ankle eversion      (Blank rows = not tested)  GAIT: 06/21/2021 Distance walked: 100' Assistive device utilized: Crutches Level of assistance: Modified independence Comments: adjusted handles to appropriate height; will review transition to single crutch next visit; decreased hip/knee flexion on Rt and decreased stance on Rt.   EDEMA: 06/21/2021  swelling noted in Rt knee     TODAY'S TREATMENT: 06/27/2021 Therapeutic Exercise: Aerobic: recumbent bike seat 7 level 1 for 8 min Supine: RLE SLR 2 sec hold, slow eccentric, 10 reps, 2 sets Seated: LAQ no wt 5 sec hold alt reps w/ankle DF 10 reps 2 sets;  Standing:  knee flexion no wt 10 reps 2 sets. Heel raises BLEs 3 sec hold 10 reps 2 sets.  Rocker board round with light UE support ant w/knee flex & post w/knee ext 1 min; right / left wt shift with knee flex/ext 1 min; Step up & down 6" step BUE support cues to minimize lifting with UEs 10 reps. PT demo & verbal cues for technique Neuromuscular Re-education:  tandem stance on foam beam 30 sec RLE in front & 30 sec RLE in back.   SLS up to 5 sec hold 5 reps. RLE stance LLE 3-way reach flex, abd, ext.   Manual Therapy: Therapeutic Activity: Gait: Pre-gait stepping RLE over 2" X 10" block initiating knee flex in terminal stance and wt shift on RLE initial contact & terminal stance. Carryover into gait. Pt ambulated 150' without device with verbal cues. Stairs- PT demo & verbal cues on  technique for alternating pattern. Pt neg 11 steps with left rail alternating pattern with slow controlled motions.  Modalities: Vasopneumatic right knee medium compression 10 min 34*  PT updated HEP as follows with verbal, demo & handout. Pt verbalized & return demo understanding.   Access Code: [email or web address] URL: [email or web address] Date: 06/27/2021 Prepared by: Jamey Reas  Exercises Long Sitting Quad Set - 5-10 x daily - 7 x weekly - 1 sets - 5-10 reps - 5 sec  hold Supine Heel Slide with Strap - 5-10 x daily - 7 x weekly - 1 sets - 5-10 reps Seated Knee Extension AROM - 5-10 x daily - 7 x weekly - 1 sets - 5-10 reps - 2-3 sec hold Seated Knee Flexion AAROM - 5-10 x daily - 7 x weekly - 1 sets - 10 reps - 10 sec hold Tandem Stance in Corner - 1 x daily - 7 x weekly - 1 sets - 2 reps - 30 seconds hold Standing Heel Raise - 2-3 x daily - 7 x weekly - 1 sets - 15 reps - 3 seconds hold Single Leg Stance - 2-3 x daily - 7 x weekly - 1 sets - 5 reps Standing Knee Flexion AROM with Chair Support - 2-3 x daily - 7 x weekly - 1 sets - 15 reps - 5 seconds hold Standing 3-Way Kick - 1 x daily - 7 x weekly - 1 sets - 10 reps   06/25/2021 Therapeutic Exercise: Aerobic: recumbent bike seat 7 level 1 for 6 min Supine: AA heel slides with strap 20 reps RLE SLR 2 sec hold, slow eccentric, 10 reps, 2 sets Seated: long sit quad set towel under ankle 5 sec hold 20 reps; LAQ no wt 5 sec hold alt reps w/ankle DF 10 reps 2 sets;  Standing:  knee flexion no wt 10 reps 2 sets. Heel raises BLEs 5 sec hold 10 reps 2 sets.  Rocker board square without UE support ant w/knee flex & post w/knee ext 1 min; right / left wt shift with knee flex/ext 1 min; Neuromuscular Re-education:  tandem stance on foam beam 30 sec RLE in front & 30 sec RLE in back.  Standing on foam with light UE support - RLE stance tapping LLE 3 cones lateral, ant, across midline 10 reps.  Manual Therapy: Therapeutic  Activity: Gait: ambulated between areas in clinic without AD with guarded pattern Self Care:  PT instructed in using pillow in sidelying & "tenting" sheets off feet.  Pt verbalized understanding.  Trigger Point Dry Needling:  Modalities: Vasopneumatic right knee medium compression 10 min 34*   06/21/21:  See HEP - performed trial reps PRN for comprehension     PATIENT EDUCATION:  Education details: HEP Person educated: Patient Education method: Explanation, Demonstration, and Handouts Education comprehension: verbalized understanding, returned demonstration, and needs further education     HOME EXERCISE PROGRAM: Access Code: [email or web address] URL: [email or web address] Date: 06/21/2021 Prepared by: Faustino Congress   Program Notes [email or web address] : [employer] Stretching Strap Yoga Strap Physical Therapy for Home Workout, Exercise, Pilates and Gymnastics, 10 Loops Non-Elastic Stretch Bands with Workout Guide for Women & Men. : Sports & Outdoors       Exercises Long Sitting Quad Set - 5-10 x daily - 7 x weekly - 1 sets - 5-10 reps - 5 sec hold Supine Heel Slide with Strap - 5-10 x daily - 7 x weekly - 1 sets - 5-10 reps Seated Knee Extension AROM - 5-10 x daily - 7 x weekly - 1 sets - 5-10 reps - 2-3 sec hold Seated Knee Flexion AAROM - 5-10 x daily - 7 x weekly - 1 sets - 10 reps - 10 sec hold     ASSESSMENT:   CLINICAL IMPRESSION: PT updated HEP which she appears to understand.  Pt improved gait and negotiating stairs with instruction.     OBJECTIVE IMPAIRMENTS Abnormal gait, decreased activity tolerance, decreased balance, decreased endurance, decreased knowledge of use of DME,  decreased mobility, difficulty walking, decreased ROM, decreased strength, increased edema, and pain.    ACTIVITY LIMITATIONS cleaning, community activity, driving, occupation, laundry, and shopping.    PERSONAL FACTORS 3+ comorbidities: DM, HTN, obesity  are also affecting patient's functional  outcome.    REHAB POTENTIAL: Excellent   CLINICAL DECISION MAKING: Evolving/moderate complexity   EVALUATION COMPLEXITY: Moderate   GOALS: Goals reviewed with patient? Yes   SHORT TERM GOALS:   STG Name Target Date Goal status  1 Independent with initial HEP Baseline:  07/19/2021 INITIAL  2 Rt knee AROM improved 0-110 for improved function Baseline:  07/19/2021 INITIAL    LONG TERM GOALS:    LTG Name Target Date Goal status  1 Independent with final HEP Baseline: 08/16/2021 INITIAL  2 FOTO score improved to 66 for improved function Baseline: 08/16/2021 INITIAL  3 Rt knee ARM improved to 3-0-130 for improved function Baseline: 08/16/2021 INITIAL  4 Report pain < 1/10 with standing/walking for improved activity tolerance  Baseline: 08/16/2021 INITIAL  5 Amb without AD independently without significant deviations for improved function Baseline: 08/16/2021 INITIAL  6 Improve Rt knee strength to 5/5 for improved function Baseline: 08/16/2021 INITIAL     PLAN: PT FREQUENCY: 2x/week   PT DURATION: 8 weeks   PLANNED INTERVENTIONS: Therapeutic exercises, Therapeutic activity, Neuro Muscular re-education, Balance training, Gait training, Patient/Family education, Joint mobilization, Stair training, DME instructions, Dry Needling, Electrical stimulation, Cryotherapy, Moist heat, Taping, Vasopneumatic device, and Manual therapy   PLAN FOR NEXT SESSION: check updated HEP, progress exercises including balance & function,  end session with vaso.   Jamey Reas, PT, DPT 06/27/2021, 4:30 PM

## 2021-07-02 ENCOUNTER — Other Ambulatory Visit: Payer: Self-pay

## 2021-07-02 ENCOUNTER — Ambulatory Visit: Payer: Federal, State, Local not specified - PPO | Admitting: Physical Therapy

## 2021-07-02 ENCOUNTER — Encounter: Payer: Self-pay | Admitting: Physical Therapy

## 2021-07-02 DIAGNOSIS — R600 Localized edema: Secondary | ICD-10-CM

## 2021-07-02 DIAGNOSIS — M6281 Muscle weakness (generalized): Secondary | ICD-10-CM

## 2021-07-02 DIAGNOSIS — M25561 Pain in right knee: Secondary | ICD-10-CM | POA: Diagnosis not present

## 2021-07-02 DIAGNOSIS — M25661 Stiffness of right knee, not elsewhere classified: Secondary | ICD-10-CM

## 2021-07-02 DIAGNOSIS — R2689 Other abnormalities of gait and mobility: Secondary | ICD-10-CM | POA: Diagnosis not present

## 2021-07-02 NOTE — Therapy (Signed)
OUTPATIENT PHYSICAL THERAPY TREATMENT NOTE   Patient Name: Michele [name] MRN: [id number] DOB:07-Feb-1976, 46 y.o., female Today's Date: 07/02/2021  PCP: Caren Macadam, MD REFERRING PROVIDER: Meredith Pel, MD   PT End of Session - 07/02/21 1524     Visit Number 4    Number of Visits 16    Date for PT Re-Evaluation 08/16/21    PT Start Time 0388    PT Stop Time 1605    PT Time Calculation (min) 51 min    Activity Tolerance Patient tolerated treatment well    Behavior During Therapy [facility] for tasks assessed/performed               Past Medical History:  Diagnosis Date   Diabetes mellitus without complication ([place])    Hypertension    under control with med., has been on med. x 2 yr.   Hypertrophy of breast 03/2013   Obesity    Past Surgical History:  Procedure Laterality Date   ABDOMINAL HYSTERECTOMY N/A 04/13/2020   Procedure: HYSTERECTOMY ABDOMINAL;  Surgeon: Delsa Bern, MD;  Location: [place];  Service: Gynecology;  Laterality: N/A;  AUTO INFUSION PROTOCOL   BILATERAL SALPINGECTOMY Bilateral 04/13/2020   Procedure: OPEN BILATERAL SALPINGECTOMY;  Surgeon: Delsa Bern, MD;  Location: [place];  Service: Gynecology;  Laterality: Bilateral;   BREAST REDUCTION SURGERY Bilateral 04/26/2013   Procedure: BILATERAL BREAST REDUCTION WITH LIPOSUCTION;  Surgeon: Cristine Polio, MD;  Location: [place];  Service: Plastics;  Laterality: Bilateral;   CHOLECYSTECTOMY N/A 05/10/2016   Procedure: LAPAROSCOPIC CHOLECYSTECTOMY WITH INTRAOPERATIVE CHOLANGIOGRAM;  Surgeon: Donnie Mesa, MD;  Location: [facility];  Service: General;  Laterality: N/A;   FOOT SURGERY  2003; 2004   each foot - bunion, hammer toe   OSTEOCHONDRAL DEFECT REPAIR/RECONSTRUCTION Right 05/14/2021   Procedure: right knee osteochondral open allograft fro medial femoral defect;  Surgeon: Meredith Pel, MD;  Location: [place];  Service: Orthopedics;  Laterality: Right;    Patient Active Problem List   Diagnosis Date Noted   Osteochondritis dissecans of right knee    Multinodular goiter 12/31/2020   Fibroids 04/03/2020   Patellofemoral syndrome of right knee 11/24/2019   Nonallopathic lesion of lumbar region 09/09/2019   Nonallopathic lesion of sacral region 09/09/2019   Nonallopathic lesion of thoracic region 09/09/2019   Nonallopathic lesion of rib cage 09/09/2019   Nonallopathic lesion of cervical region 09/09/2019   Acute bursitis of left shoulder 09/09/2019   Cholecystitis, acute with cholelithiasis 05/09/2016   Gynecomastia, female 02/03/2013   Back pain, acute 06/22/2012   Obesity 05/25/2010   Essential hypertension 05/25/2010    REFERRING DIAG: M95.8 (ICD-10-CM) - Osteochondral defect of femoral condyle   THERAPY DIAG:  Acute pain of right knee  Stiffness of right knee, not elsewhere classified  Other abnormalities of gait and mobility  Muscle weakness (generalized)  Localized edema  PERTINENT HISTORY: DM, HTN, obesity  PRECAUTIONS: Fall  SUBJECTIVE: doing well, no complaints; went to comedy show this weekend and did some walking   PAIN:  Are you having pain? Yes NPRS scale: 2/10 in last week, lowest 0/10 sitting resting & highest 3/10 Pain location: knee Pain orientation: Right and Anterior  PAIN TYPE: tight Pain description: intermittent  Aggravating factors: letting knee stay in one position, when first gets up after sitting Relieving factors: moving knee  OBJECTIVE:    DIAGNOSTIC FINDINGS: MRI: 1.5 cm AP x 1.0 cm TR osteochondral defect of the mesial central medial femoral  condyle with the displaced fragment in the suprapatellar joint space.   PATIENT SURVEYS:  06/21/21 FOTO 40 (predicted 66)   LE AROM/PROM:   A/PROM Right 06/21/21 Left 06/21/21 Right 07/02/21  Hip flexion       Hip extension       Hip abduction       Hip adduction       Hip internal rotation       Hip external rotation       Knee flexion  A 101 P 107 A 137 A 118  Knee extension A -11 (LAQ) P 0 A 5 A -2  (LAQ)  Ankle dorsiflexion       Ankle plantarflexion       Ankle inversion       Ankle eversion       (Blank rows = not tested)   LE MMT:                               Deferred due to post op status MMT Right 06/21/21 Left 06/21/21  Hip flexion      Hip extension      Hip abduction      Hip adduction      Hip internal rotation      Hip external rotation      Knee flexion      Knee extension      Ankle dorsiflexion      Ankle plantarflexion      Ankle inversion      Ankle eversion      (Blank rows = not tested)  GAIT: 06/21/2021 Distance walked: 100' Assistive device utilized: Crutches Level of assistance: Modified independence Comments: adjusted handles to appropriate height; will review transition to single crutch next visit; decreased hip/knee flexion on Rt and decreased stance on Rt.   EDEMA: 06/21/2021  swelling noted in Rt knee     TODAY'S TREATMENT: 07/02/21 Therapeutic Exercise: Aerobic: recumbent bike seat 7 level 2 for 8 min Seated:  LAQ 3# 3-5 sec hold 3x10 on Rt  Rt SLR 2x10 Machines:  Leg Press 75# 3x10 Standing:  Step ups 2x10 bil on 6" step  Heel taps with RLE on 4" step 2x10 Modalities: Vaso Rt knee x 10 min; mod pressure, 34 deg    06/27/2021 Therapeutic Exercise: Aerobic: recumbent bike seat 7 level 1 for 8 min Supine: RLE SLR 2 sec hold, slow eccentric, 10 reps, 2 sets Standing:  knee flexion no wt 10 reps 2 sets. Heel raises BLEs 3 sec hold 10 reps 2 sets.  Rocker board round with light UE support ant w/knee flex & post w/knee ext 1 min; right / left wt shift with knee flex/ext 1 min; Step up & down 6" step BUE support cues to minimize lifting with UEs 10 reps. PT demo & verbal cues for technique Neuromuscular Re-education:  tandem stance on foam beam 30 sec RLE in front & 30 sec RLE in back.   SLS up to 5 sec hold 5 reps. RLE stance LLE 3-way reach flex, abd, ext.   Manual  Therapy: Therapeutic Activity: Gait: Pre-gait stepping RLE over 2" X 10" block initiating knee flex in terminal stance and wt shift on RLE initial contact & terminal stance. Carryover into gait. Pt ambulated 150' without device with verbal cues. Stairs- PT demo & verbal cues on technique for alternating pattern. Pt neg 11 steps with left rail alternating pattern with  slow controlled motions.  Modalities: Vasopneumatic right knee medium compression 10 min 34*  PT updated HEP as follows with verbal, demo & handout. Pt verbalized & return demo understanding.   Exercises Long Sitting Quad Set - 5-10 x daily - 7 x weekly - 1 sets - 5-10 reps - 5 sec hold Supine Heel Slide with Strap - 5-10 x daily - 7 x weekly - 1 sets - 5-10 reps Seated Knee Extension AROM - 5-10 x daily - 7 x weekly - 1 sets - 5-10 reps - 2-3 sec hold Seated Knee Flexion AAROM - 5-10 x daily - 7 x weekly - 1 sets - 10 reps - 10 sec hold Tandem Stance in Corner - 1 x daily - 7 x weekly - 1 sets - 2 reps - 30 seconds hold Standing Heel Raise - 2-3 x daily - 7 x weekly - 1 sets - 15 reps - 3 seconds hold Single Leg Stance - 2-3 x daily - 7 x weekly - 1 sets - 5 reps Standing Knee Flexion AROM with Chair Support - 2-3 x daily - 7 x weekly - 1 sets - 15 reps - 5 seconds hold Standing 3-Way Kick - 1 x daily - 7 x weekly - 1 sets - 10 reps   06/25/2021 Therapeutic Exercise: Aerobic: recumbent bike seat 7 level 1 for 6 min Supine: AA heel slides with strap 20 reps RLE SLR 2 sec hold, slow eccentric, 10 reps, 2 sets Seated: long sit quad set towel under ankle 5 sec hold 20 reps; LAQ no wt 5 sec hold alt reps w/ankle DF 10 reps 2 sets;  Standing:  knee flexion no wt 10 reps 2 sets. Heel raises BLEs 5 sec hold 10 reps 2 sets.  Rocker board square without UE support ant w/knee flex & post w/knee ext 1 min; right / left wt shift with knee flex/ext 1 min; Neuromuscular Re-education:  tandem stance on foam beam 30 sec RLE in front & 30  sec RLE in back.  Standing on foam with light UE support - RLE stance tapping LLE 3 cones lateral, ant, across midline 10 reps.  Manual Therapy: Therapeutic Activity: Gait: ambulated between areas in clinic without AD with guarded pattern Self Care:  PT instructed in using pillow in sidelying & "tenting" sheets off feet.  Pt verbalized understanding.  Trigger Point Dry Needling:  Modalities: Vasopneumatic right knee medium compression 10 min 34*      PATIENT EDUCATION:  Education details: HEP Person educated: Patient Education method: Explanation, Demonstration, and Handouts Education comprehension: verbalized understanding, returned demonstration, and needs further education     HOME EXERCISE PROGRAM: Access Code: [email or web address] URL: [email or web address] Date: 06/21/2021 Prepared by: Faustino Congress   Program Notes [email or web address] : [employer] Stretching Strap Yoga Strap Physical Therapy for Home Workout, Exercise, Pilates and Gymnastics, 10 Loops Non-Elastic Stretch Bands with Workout Guide for Women & Men. : Sports & Outdoors       Exercises Long Sitting Quad Set - 5-10 x daily - 7 x weekly - 1 sets - 5-10 reps - 5 sec hold Supine Heel Slide with Strap - 5-10 x daily - 7 x weekly - 1 sets - 5-10 reps Seated Knee Extension AROM - 5-10 x daily - 7 x weekly - 1 sets - 5-10 reps - 2-3 sec hold Seated Knee Flexion AAROM - 5-10 x daily - 7 x weekly - 1 sets - 10 reps - 10 sec hold     ASSESSMENT:  CLINICAL IMPRESSION: Pt tolerated session well today with progression of strengthening exercises.  AROM improved today as well.  Will continue to benefit from PT to maximize function.   OBJECTIVE IMPAIRMENTS Abnormal gait, decreased activity tolerance, decreased balance, decreased endurance, decreased knowledge of use of DME, decreased mobility, difficulty walking, decreased ROM, decreased strength, increased edema, and pain.    ACTIVITY LIMITATIONS cleaning, community activity,  driving, occupation, laundry, and shopping.    PERSONAL FACTORS 3+ comorbidities: DM, HTN, obesity  are also affecting patient's functional outcome.    REHAB POTENTIAL: Excellent   CLINICAL DECISION MAKING: Evolving/moderate complexity   EVALUATION COMPLEXITY: Moderate   GOALS: Goals reviewed with patient? Yes   SHORT TERM GOALS:   STG Name Target Date Goal status  1 Independent with initial HEP Baseline:  07/19/2021 INITIAL  2 Rt knee AROM improved 0-110 for improved function Baseline:  07/19/2021 INITIAL    LONG TERM GOALS:    LTG Name Target Date Goal status  1 Independent with final HEP Baseline: 08/16/2021 INITIAL  2 FOTO score improved to 66 for improved function Baseline: 08/16/2021 INITIAL  3 Rt knee ARM improved to 3-0-130 for improved function Baseline: 08/16/2021 INITIAL  4 Report pain < 1/10 with standing/walking for improved activity tolerance  Baseline: 08/16/2021 INITIAL  5 Amb without AD independently without significant deviations for improved function Baseline: 08/16/2021 INITIAL  6 Improve Rt knee strength to 5/5 for improved function Baseline: 08/16/2021 INITIAL     PLAN: PT FREQUENCY: 2x/week   PT DURATION: 8 weeks   PLANNED INTERVENTIONS: Therapeutic exercises, Therapeutic activity, Neuro Muscular re-education, Balance training, Gait training, Patient/Family education, Joint mobilization, Stair training, DME instructions, Dry Needling, Electrical stimulation, Cryotherapy, Moist heat, Taping, Vasopneumatic device, and Manual therapy   PLAN FOR NEXT SESSION: continue balance and strengthening, vaso PRN, review HEP PRN  Faustino Congress, PT, DPT 07/02/2021, 3:57 PM

## 2021-07-04 ENCOUNTER — Other Ambulatory Visit: Payer: Self-pay

## 2021-07-04 ENCOUNTER — Encounter: Payer: Self-pay | Admitting: Physical Therapy

## 2021-07-04 ENCOUNTER — Ambulatory Visit: Payer: Federal, State, Local not specified - PPO | Admitting: Physical Therapy

## 2021-07-04 DIAGNOSIS — M25561 Pain in right knee: Secondary | ICD-10-CM

## 2021-07-04 DIAGNOSIS — M25661 Stiffness of right knee, not elsewhere classified: Secondary | ICD-10-CM

## 2021-07-04 DIAGNOSIS — M6281 Muscle weakness (generalized): Secondary | ICD-10-CM | POA: Diagnosis not present

## 2021-07-04 DIAGNOSIS — R600 Localized edema: Secondary | ICD-10-CM

## 2021-07-04 DIAGNOSIS — R2689 Other abnormalities of gait and mobility: Secondary | ICD-10-CM

## 2021-07-04 NOTE — Therapy (Signed)
OUTPATIENT PHYSICAL THERAPY TREATMENT NOTE   Patient Name: Michele [name] MRN: [id number] DOB:[birth year], 46 y.o., female Today's Date: 07/04/2021  PCP: Caren Macadam, MD REFERRING PROVIDER: Meredith Pel, MD   PT End of Session - 07/04/21 1435     Visit Number 5    Number of Visits 16    Date for PT Re-Evaluation 08/16/21    PT Start Time 8416    PT Stop Time 1520    PT Time Calculation (min) 48 min    Activity Tolerance Patient tolerated treatment well    Behavior During Therapy [facility] for tasks assessed/performed                Past Medical History:  Diagnosis Date   Diabetes mellitus without complication ([place])    Hypertension    under control with med., has been on med. x 2 yr.   Hypertrophy of breast 03/2013   Obesity    Past Surgical History:  Procedure Laterality Date   ABDOMINAL HYSTERECTOMY N/A 04/13/2020   Procedure: HYSTERECTOMY ABDOMINAL;  Surgeon: Delsa Bern, MD;  Location: [place];  Service: Gynecology;  Laterality: N/A;  AUTO INFUSION PROTOCOL   BILATERAL SALPINGECTOMY Bilateral 04/13/2020   Procedure: OPEN BILATERAL SALPINGECTOMY;  Surgeon: Delsa Bern, MD;  Location: [place];  Service: Gynecology;  Laterality: Bilateral;   BREAST REDUCTION SURGERY Bilateral 04/26/2013   Procedure: BILATERAL BREAST REDUCTION WITH LIPOSUCTION;  Surgeon: Cristine Polio, MD;  Location: [place];  Service: Plastics;  Laterality: Bilateral;   CHOLECYSTECTOMY N/A 05/10/2016   Procedure: LAPAROSCOPIC CHOLECYSTECTOMY WITH INTRAOPERATIVE CHOLANGIOGRAM;  Surgeon: Donnie Mesa, MD;  Location: [place] [facility];  Service: General;  Laterality: N/A;   FOOT SURGERY  2003; 2004   each foot - bunion, hammer toe   OSTEOCHONDRAL DEFECT REPAIR/RECONSTRUCTION Right 05/14/2021   Procedure: right knee osteochondral open allograft fro medial femoral defect;  Surgeon: Meredith Pel, MD;  Location: [place];  Service: Orthopedics;  Laterality:  Right;   Patient Active Problem List   Diagnosis Date Noted   Osteochondritis dissecans of right knee    Multinodular goiter 12/31/2020   Fibroids 04/03/2020   Patellofemoral syndrome of right knee 11/24/2019   Nonallopathic lesion of lumbar region 09/09/2019   Nonallopathic lesion of sacral region 09/09/2019   Nonallopathic lesion of thoracic region 09/09/2019   Nonallopathic lesion of rib cage 09/09/2019   Nonallopathic lesion of cervical region 09/09/2019   Acute bursitis of left shoulder 09/09/2019   Cholecystitis, acute with cholelithiasis 05/09/2016   Gynecomastia, female 02/03/2013   Back pain, acute 06/22/2012   Obesity 05/25/2010   Essential hypertension 05/25/2010    REFERRING DIAG: M95.8 (ICD-10-CM) - Osteochondral defect of femoral condyle   THERAPY DIAG:  Acute pain of right knee  Stiffness of right knee, not elsewhere classified  Other abnormalities of gait and mobility  Muscle weakness (generalized)  Localized edema  PERTINENT HISTORY: DM, HTN, obesity  PRECAUTIONS: Fall  SUBJECTIVE: still having some discomfort in knee, does feel slightly improved   PAIN:  Are you having pain? No NPRS scale: 0/10  Pain location: knee Pain orientation: Right and Anterior  PAIN TYPE: tight Pain description: intermittent  Aggravating factors: letting knee stay in one position, when first gets up after sitting Relieving factors: moving knee  OBJECTIVE:    DIAGNOSTIC FINDINGS: MRI: 1.5 cm AP x 1.0 cm TR osteochondral defect of the mesial central medial femoral condyle with the displaced fragment in the suprapatellar joint space.  PATIENT SURVEYS:  06/21/21 FOTO 40 (predicted 66)   LE AROM/PROM:   A/PROM Right 06/21/21 Left 06/21/21 Right 07/02/21  Hip flexion       Hip extension       Hip abduction       Hip adduction       Hip internal rotation       Hip external rotation       Knee flexion A 101 P 107 A 137 A 118  Knee extension A -11 (LAQ) P 0 A 5 A  -2  (LAQ)  Ankle dorsiflexion       Ankle plantarflexion       Ankle inversion       Ankle eversion       (Blank rows = not tested)   LE MMT:                               Deferred due to post op status MMT Right 06/21/21 Left 06/21/21  Hip flexion      Hip extension      Hip abduction      Hip adduction      Hip internal rotation      Hip external rotation      Knee flexion      Knee extension      Ankle dorsiflexion      Ankle plantarflexion      Ankle inversion      Ankle eversion      (Blank rows = not tested)  GAIT: 07/04/21:  Amb without AD independently; mild antalgic gait first few steps normalizing with increased distance   EDEMA: 06/21/2021  swelling noted in Rt knee     TODAY'S TREATMENT: 07/04/21 Therapeutic Exercise: Aerobic: recumbent bike seat 7 level 4 for 8 min Seated:   Rt SLR 3x10, 2# Machines:  Leg Press 75# 3x10 Standing:  Heel taps with RLE on 4" step 3x10  SLS on foam (RLE only with 1 finger UE support) with LLE movement fwd/lateral/back 2 x 10 reps each  Calf raises (focus on RLE) 3x10  SLS on Rt with LLE taps to Blaze Pods 4 cycles x 30 sec each, 15 sec rest between cycles Modalities: Vaso Rt knee x 10 min; mod pressure, 34 deg  07/02/21 Therapeutic Exercise: Aerobic: recumbent bike seat 7 level 2 for 8 min Seated:  LAQ 3# 3-5 sec hold 3x10 on Rt  Rt SLR 2x10 Machines:  Leg Press 75# 3x10 Standing:  Step ups 2x10 bil on 6" step  Heel taps with RLE on 4" step 2x10 Modalities: Vaso Rt knee x 10 min; mod pressure, 34 deg    06/27/2021 Therapeutic Exercise: Aerobic: recumbent bike seat 7 level 1 for 8 min Supine: RLE SLR 2 sec hold, slow eccentric, 10 reps, 2 sets Standing:  knee flexion no wt 10 reps 2 sets. Heel raises BLEs 3 sec hold 10 reps 2 sets.  Rocker board round with light UE support ant w/knee flex & post w/knee ext 1 min; right / left wt shift with knee flex/ext 1 min; Step up & down 6" step BUE support cues to minimize  lifting with UEs 10 reps. PT demo & verbal cues for technique Neuromuscular Re-education:  tandem stance on foam beam 30 sec RLE in front & 30 sec RLE in back.   SLS up to 5 sec hold 5 reps. RLE stance LLE 3-way reach flex, abd, ext.  Manual Therapy: Therapeutic Activity: Gait: Pre-gait stepping RLE over 2" X 10" block initiating knee flex in terminal stance and wt shift on RLE initial contact & terminal stance. Carryover into gait. Pt ambulated 150' without device with verbal cues. Stairs- PT demo & verbal cues on technique for alternating pattern. Pt neg 11 steps with left rail alternating pattern with slow controlled motions.  Modalities: Vasopneumatic right knee medium compression 10 min 34*  PT updated HEP as follows with verbal, demo & handout. Pt verbalized & return demo understanding.   Exercises Long Sitting Quad Set - 5-10 x daily - 7 x weekly - 1 sets - 5-10 reps - 5 sec hold Supine Heel Slide with Strap - 5-10 x daily - 7 x weekly - 1 sets - 5-10 reps Seated Knee Extension AROM - 5-10 x daily - 7 x weekly - 1 sets - 5-10 reps - 2-3 sec hold Seated Knee Flexion AAROM - 5-10 x daily - 7 x weekly - 1 sets - 10 reps - 10 sec hold Tandem Stance in Corner - 1 x daily - 7 x weekly - 1 sets - 2 reps - 30 seconds hold Standing Heel Raise - 2-3 x daily - 7 x weekly - 1 sets - 15 reps - 3 seconds hold Single Leg Stance - 2-3 x daily - 7 x weekly - 1 sets - 5 reps Standing Knee Flexion AROM with Chair Support - 2-3 x daily - 7 x weekly - 1 sets - 15 reps - 5 seconds hold Standing 3-Way Kick - 1 x daily - 7 x weekly - 1 sets - 10 reps     PATIENT EDUCATION:  Education details: HEP Person educated: Patient Education method: Consulting civil engineer, Media planner, and Handouts Education comprehension: verbalized understanding, returned demonstration, and needs further education     HOME EXERCISE PROGRAM: Access Code: [email or web address] URL: [email or web address] Date: 06/21/2021 Prepared  by: Faustino Congress   Program Notes [email or web address] : [employer]er Stretching Strap Yoga Strap Physical Therapy for Home Workout, Exercise, Pilates and Gymnastics, 10 Loops Non-Elastic Stretch Bands with Workout Guide for Women & Men. : Sports & Outdoors       Exercises Long Sitting Quad Set - 5-10 x daily - 7 x weekly - 1 sets - 5-10 reps - 5 sec hold Supine Heel Slide with Strap - 5-10 x daily - 7 x weekly - 1 sets - 5-10 reps Seated Knee Extension AROM - 5-10 x daily - 7 x weekly - 1 sets - 5-10 reps - 2-3 sec hold Seated Knee Flexion AAROM - 5-10 x daily - 7 x weekly - 1 sets - 10 reps - 10 sec hold     ASSESSMENT:   CLINICAL IMPRESSION: Progressed balance and standing exercises today with expected fatigue, but no increased pain.  Overall progressing well with PT at this time.  Will continue to benefit from PT to maximize function.  OBJECTIVE IMPAIRMENTS Abnormal gait, decreased activity tolerance, decreased balance, decreased endurance, decreased knowledge of use of DME, decreased mobility, difficulty walking, decreased ROM, decreased strength, increased edema, and pain.    ACTIVITY LIMITATIONS cleaning, community activity, driving, occupation, laundry, and shopping.    PERSONAL FACTORS 3+ comorbidities: DM, HTN, obesity  are also affecting patient's functional outcome.    REHAB POTENTIAL: Excellent   CLINICAL DECISION MAKING: Evolving/moderate complexity   EVALUATION COMPLEXITY: Moderate   GOALS: Goals reviewed with patient? Yes   SHORT TERM GOALS:   STG Name Target Date Goal status  1 Independent  with initial HEP Baseline:  07/19/2021 INITIAL  2 Rt knee AROM improved 0-110 for improved function Baseline:  07/19/2021 INITIAL    LONG TERM GOALS:    LTG Name Target Date Goal status  1 Independent with final HEP Baseline: 08/16/2021 INITIAL  2 FOTO score improved to 66 for improved function Baseline: 08/16/2021 INITIAL  3 Rt knee ARM improved to 3-0-130 for improved  function Baseline: 08/16/2021 INITIAL  4 Report pain < 1/10 with standing/walking for improved activity tolerance  Baseline: 08/16/2021 INITIAL  5 Amb without AD independently without significant deviations for improved function Baseline: 08/16/2021 INITIAL  6 Improve Rt knee strength to 5/5 for improved function Baseline: 08/16/2021 INITIAL     PLAN: PT FREQUENCY: 2x/week   PT DURATION: 8 weeks   PLANNED INTERVENTIONS: Therapeutic exercises, Therapeutic activity, Neuro Muscular re-education, Balance training, Gait training, Patient/Family education, Joint mobilization, Stair training, DME instructions, Dry Needling, Electrical stimulation, Cryotherapy, Moist heat, Taping, Vasopneumatic device, and Manual therapy   PLAN FOR NEXT SESSION: continue balance (enjoyed Blaze Pods) and strengthening, vaso PRN  Faustino Congress, PT, DPT 07/04/2021, 3:11 PM

## 2021-07-09 ENCOUNTER — Other Ambulatory Visit: Payer: Self-pay

## 2021-07-09 ENCOUNTER — Encounter: Payer: Self-pay | Admitting: Physical Therapy

## 2021-07-09 ENCOUNTER — Ambulatory Visit: Payer: Federal, State, Local not specified - PPO | Admitting: Physical Therapy

## 2021-07-09 DIAGNOSIS — M6281 Muscle weakness (generalized): Secondary | ICD-10-CM | POA: Diagnosis not present

## 2021-07-09 DIAGNOSIS — M25661 Stiffness of right knee, not elsewhere classified: Secondary | ICD-10-CM

## 2021-07-09 DIAGNOSIS — R2689 Other abnormalities of gait and mobility: Secondary | ICD-10-CM

## 2021-07-09 DIAGNOSIS — M25561 Pain in right knee: Secondary | ICD-10-CM | POA: Diagnosis not present

## 2021-07-09 DIAGNOSIS — R600 Localized edema: Secondary | ICD-10-CM

## 2021-07-09 NOTE — Therapy (Signed)
OUTPATIENT PHYSICAL THERAPY TREATMENT NOTE   Patient Name: Michele [name] MRN: [id number] DOB:[birth year], 46 y.o., female Today's Date: 07/09/2021  PCP: Caren Macadam, MD REFERRING PROVIDER: Meredith Pel, MD   PT End of Session - 07/09/21 1534     Visit Number 6    Number of Visits 16    Date for PT Re-Evaluation 08/16/21    PT Start Time 0258    PT Stop Time 1559    PT Time Calculation (min) 45 min    Activity Tolerance Patient tolerated treatment well    Behavior During Therapy [facility] for tasks assessed/performed                 Past Medical History:  Diagnosis Date   Diabetes mellitus without complication ([place])    Hypertension    under control with med., has been on med. x 2 yr.   Hypertrophy of breast 03/2013   Obesity    Past Surgical History:  Procedure Laterality Date   ABDOMINAL HYSTERECTOMY N/A 04/13/2020   Procedure: HYSTERECTOMY ABDOMINAL;  Surgeon: Delsa Bern, MD;  Location: [place];  Service: Gynecology;  Laterality: N/A;  AUTO INFUSION PROTOCOL   BILATERAL SALPINGECTOMY Bilateral 04/13/2020   Procedure: OPEN BILATERAL SALPINGECTOMY;  Surgeon: Delsa Bern, MD;  Location: [place];  Service: Gynecology;  Laterality: Bilateral;   BREAST REDUCTION SURGERY Bilateral 04/26/2013   Procedure: BILATERAL BREAST REDUCTION WITH LIPOSUCTION;  Surgeon: Cristine Polio, MD;  Location: [place];  Service: Plastics;  Laterality: Bilateral;   CHOLECYSTECTOMY N/A 05/10/2016   Procedure: LAPAROSCOPIC CHOLECYSTECTOMY WITH INTRAOPERATIVE CHOLANGIOGRAM;  Surgeon: Donnie Mesa, MD;  Location: [place] [facility];  Service: General;  Laterality: N/A;   FOOT SURGERY  2003; 2004   each foot - bunion, hammer toe   OSTEOCHONDRAL DEFECT REPAIR/RECONSTRUCTION Right 05/14/2021   Procedure: right knee osteochondral open allograft fro medial femoral defect;  Surgeon: Meredith Pel, MD;  Location: [place];  Service: Orthopedics;  Laterality:  Right;   Patient Active Problem List   Diagnosis Date Noted   Osteochondritis dissecans of right knee    Multinodular goiter 12/31/2020   Fibroids 04/03/2020   Patellofemoral syndrome of right knee 11/24/2019   Nonallopathic lesion of lumbar region 09/09/2019   Nonallopathic lesion of sacral region 09/09/2019   Nonallopathic lesion of thoracic region 09/09/2019   Nonallopathic lesion of rib cage 09/09/2019   Nonallopathic lesion of cervical region 09/09/2019   Acute bursitis of left shoulder 09/09/2019   Cholecystitis, acute with cholelithiasis 05/09/2016   Gynecomastia, female 02/03/2013   Back pain, acute 06/22/2012   Obesity 05/25/2010   Essential hypertension 05/25/2010    REFERRING DIAG: M95.8 (ICD-10-CM) - Osteochondral defect of femoral condyle   THERAPY DIAG:  Acute pain of right knee  Stiffness of right knee, not elsewhere classified  Other abnormalities of gait and mobility  Muscle weakness (generalized)  Localized edema  PERTINENT HISTORY: DM, HTN, obesity  PRECAUTIONS: Fall  SUBJECTIVE: Rt ant knee discomfort is still present; otherwise doing well  PAIN:  Are you having pain? No NPRS scale: 0/10  Pain location: knee Pain orientation: Right and Anterior  PAIN TYPE: tight Pain description: intermittent  Aggravating factors: letting knee stay in one position, when first gets up after sitting Relieving factors: moving knee  OBJECTIVE:    DIAGNOSTIC FINDINGS: MRI: 1.5 cm AP x 1.0 cm TR osteochondral defect of the mesial central medial femoral condyle with the displaced fragment in the suprapatellar joint space.  PATIENT SURVEYS:  06/21/21 FOTO 40 (predicted 66)   LE AROM/PROM:   A/PROM Right 06/21/21 Left 06/21/21 Right 07/02/21  Hip flexion       Hip extension       Hip abduction       Hip adduction       Hip internal rotation       Hip external rotation       Knee flexion A 101 P 107 A 137 A 118  Knee extension A -11 (LAQ) P 0 A 5 A -2   (LAQ)  Ankle dorsiflexion       Ankle plantarflexion       Ankle inversion       Ankle eversion       (Blank rows = not tested)   LE MMT:                               Deferred due to post op status MMT Right 06/21/21 Left 06/21/21  Hip flexion      Hip extension      Hip abduction      Hip adduction      Hip internal rotation      Hip external rotation      Knee flexion      Knee extension      Ankle dorsiflexion      Ankle plantarflexion      Ankle inversion      Ankle eversion      (Blank rows = not tested)  GAIT: 07/04/21:  Amb without AD independently; mild antalgic gait first few steps normalizing with increased distance   EDEMA: 06/21/2021  swelling noted in Rt knee     TODAY'S TREATMENT: 07/09/21 Therapeutic Exercise: Aerobic: NuStep L6 for 8 min, LEs only Seated:   Rt SLR 3x10, 4# Machines:  Leg Press 75# 3x10 Standing:  SLS on foam (RLE only with 1 finger UE support) with LLE movement fwd/lateral/back 2 x 10 reps each  Calf raises (focus on RLE) 3x10, off 4" step  SLS on Rt with LLE taps to Blaze Pods 5 cycles x 40 sec each, 20 sec rest between cycles  RLE RDL with 10# KB 3x10 Modalities: Vaso Rt knee x 10 min; mod pressure, 34 deg  07/04/21 Therapeutic Exercise: Aerobic: recumbent bike seat 7 level 4 for 8 min Seated:   Rt SLR 3x10, 2# Machines:  Leg Press 75# 3x10 Standing:  Heel taps with RLE on 4" step 3x10  SLS on foam (RLE only with 1 finger UE support) with LLE movement fwd/lateral/back 2 x 10 reps each  Calf raises (focus on RLE) 3x10  SLS on Rt with LLE taps to Blaze Pods 4 cycles x 30 sec each, 15 sec rest between cycles Modalities: Vaso Rt knee x 10 min; mod pressure, 34 deg  07/02/21 Therapeutic Exercise: Aerobic: recumbent bike seat 7 level 2 for 8 min Seated:  LAQ 3# 3-5 sec hold 3x10 on Rt  Rt SLR 2x10 Machines:  Leg Press 75# 3x10 Standing:  Step ups 2x10 bil on 6" step  Heel taps with RLE on 4" step 2x10 Modalities: Vaso Rt  knee x 10 min; mod pressure, 34 deg      PATIENT EDUCATION:  Education details: HEP Person educated: Patient Education method: Consulting civil engineer, Media planner, and Handouts Education comprehension: verbalized understanding, returned demonstration, and needs further education     HOME EXERCISE PROGRAM: Access Code: [employer]  URL: [email or web address] Date: 06/21/2021 Prepared by: Faustino Congress   Program Notes [email or web address] : Tr[employer]er Stretching Strap Yoga Strap Physical Therapy for Home Workout, Exercise, Pilates and Gymnastics, 10 Loops Non-Elastic Stretch Bands with Workout Guide for Women & Men. : Sports & Outdoors       Exercises Long Sitting Quad Set - 5-10 x daily - 7 x weekly - 1 sets - 5-10 reps - 5 sec hold Supine Heel Slide with Strap - 5-10 x daily - 7 x weekly - 1 sets - 5-10 reps Seated Knee Extension AROM - 5-10 x daily - 7 x weekly - 1 sets - 5-10 reps - 2-3 sec hold Seated Knee Flexion AAROM - 5-10 x daily - 7 x weekly - 1 sets - 10 reps - 10 sec hold     ASSESSMENT:   CLINICAL IMPRESSION: Continues to tolerate exercises well working on strengthening and balance exercises.  Will continue to benefit from PT to maximize function.  OBJECTIVE IMPAIRMENTS Abnormal gait, decreased activity tolerance, decreased balance, decreased endurance, decreased knowledge of use of DME, decreased mobility, difficulty walking, decreased ROM, decreased strength, increased edema, and pain.    ACTIVITY LIMITATIONS cleaning, community activity, driving, occupation, laundry, and shopping.    PERSONAL FACTORS 3+ comorbidities: DM, HTN, obesity  are also affecting patient's functional outcome.    REHAB POTENTIAL: Excellent   CLINICAL DECISION MAKING: Evolving/moderate complexity   EVALUATION COMPLEXITY: Moderate   GOALS: Goals reviewed with patient? Yes   SHORT TERM GOALS:   STG Name Target Date Goal status  1 Independent with initial HEP Baseline:  07/19/2021  INITIAL  2 Rt knee AROM improved 0-110 for improved function Baseline:  07/19/2021 INITIAL    LONG TERM GOALS:    LTG Name Target Date Goal status  1 Independent with final HEP Baseline: 08/16/2021 INITIAL  2 FOTO score improved to 66 for improved function Baseline: 08/16/2021 INITIAL  3 Rt knee ARM improved to 3-0-130 for improved function Baseline: 08/16/2021 INITIAL  4 Report pain < 1/10 with standing/walking for improved activity tolerance  Baseline: 08/16/2021 INITIAL  5 Amb without AD independently without significant deviations for improved function Baseline: 08/16/2021 INITIAL  6 Improve Rt knee strength to 5/5 for improved function Baseline: 08/16/2021 INITIAL     PLAN: PT FREQUENCY: 2x/week   PT DURATION: 8 weeks   PLANNED INTERVENTIONS: Therapeutic exercises, Therapeutic activity, Neuro Muscular re-education, Balance training, Gait training, Patient/Family education, Joint mobilization, Stair training, DME instructions, Dry Needling, Electrical stimulation, Cryotherapy, Moist heat, Taping, Vasopneumatic device, and Manual therapy   PLAN FOR NEXT SESSION: measure motion, needs MD note, continue balance (enjoyed Blaze Pods) and strengthening, vaso PRN  Faustino Congress, PT, DPT 07/09/2021, 3:52 PM

## 2021-07-11 ENCOUNTER — Encounter: Payer: Self-pay | Admitting: Family Medicine

## 2021-07-11 ENCOUNTER — Ambulatory Visit: Payer: Federal, State, Local not specified - PPO | Admitting: Physical Therapy

## 2021-07-11 ENCOUNTER — Other Ambulatory Visit: Payer: Self-pay

## 2021-07-11 ENCOUNTER — Encounter: Payer: Self-pay | Admitting: Physical Therapy

## 2021-07-11 ENCOUNTER — Ambulatory Visit (INDEPENDENT_AMBULATORY_CARE_PROVIDER_SITE_OTHER): Payer: Federal, State, Local not specified - PPO | Admitting: Orthopedic Surgery

## 2021-07-11 DIAGNOSIS — M6281 Muscle weakness (generalized): Secondary | ICD-10-CM

## 2021-07-11 DIAGNOSIS — R2689 Other abnormalities of gait and mobility: Secondary | ICD-10-CM

## 2021-07-11 DIAGNOSIS — M25561 Pain in right knee: Secondary | ICD-10-CM | POA: Diagnosis not present

## 2021-07-11 DIAGNOSIS — M25661 Stiffness of right knee, not elsewhere classified: Secondary | ICD-10-CM | POA: Diagnosis not present

## 2021-07-11 DIAGNOSIS — R600 Localized edema: Secondary | ICD-10-CM

## 2021-07-11 DIAGNOSIS — M958 Other specified acquired deformities of musculoskeletal system: Secondary | ICD-10-CM

## 2021-07-11 NOTE — Therapy (Signed)
?OUTPATIENT PHYSICAL THERAPY TREATMENT NOTE ? ? ?Patient Name: Michele [name] ?MRN: [id number] ?DOB:1975-11-01, 46 y.o., female ?Today's Date: 07/11/2021 ? ?PCP: Caren Macadam, MD ?REFERRING PROVIDER: Meredith Pel, MD ? ? PT End of Session - 07/11/21 1524   ? ? Visit Number 7   ? Number of Visits 16   ? Date for PT Re-Evaluation 08/16/21   ? PT Start Time 1515   ? PT Stop Time 3235   ? PT Time Calculation (min) 48 min   ? Activity Tolerance Patient tolerated treatment well   ? Behavior During Therapy [facility] for tasks assessed/performed   ? ?  ?  ? ?  ? ? ? ? ? ? ?Past Medical History:  ?Diagnosis Date  ? Diabetes mellitus without complication ([place])   ? Hypertension   ? under control with med., has been on med. x 2 yr.  ? Hypertrophy of breast 03/2013  ? Obesity   ? ?Past Surgical History:  ?Procedure Laterality Date  ? ABDOMINAL HYSTERECTOMY N/A 04/13/2020  ? Procedure: HYSTERECTOMY ABDOMINAL;  Surgeon: Delsa Bern, MD;  Location: [place];  Service: Gynecology;  Laterality: N/A;  AUTO INFUSION PROTOCOL  ? BILATERAL SALPINGECTOMY Bilateral 04/13/2020  ? Procedure: OPEN BILATERAL SALPINGECTOMY;  Surgeon: Delsa Bern, MD;  Location: [place];  Service: Gynecology;  Laterality: Bilateral;  ? BREAST REDUCTION SURGERY Bilateral 04/26/2013  ? Procedure: BILATERAL BREAST REDUCTION WITH LIPOSUCTION;  Surgeon: Cristine Polio, MD;  Location: [place];  Service: Plastics;  Laterality: Bilateral;  ? CHOLECYSTECTOMY N/A 05/10/2016  ? Procedure: LAPAROSCOPIC CHOLECYSTECTOMY WITH INTRAOPERATIVE CHOLANGIOGRAM;  Surgeon: Donnie Mesa, MD;  Location: [place];  Service: General;  Laterality: N/A;  ? FOOT SURGERY  2003; 2004  ? each foot - bunion, hammer toe  ? OSTEOCHONDRAL DEFECT REPAIR/RECONSTRUCTION Right 05/14/2021  ? Procedure: right knee osteochondral open allograft fro medial femoral defect;  Surgeon: Meredith Pel, MD;  Location: [place];  Service: Orthopedics;  Laterality:  Right;  ? ?Patient Active Problem List  ? Diagnosis Date Noted  ? Osteochondritis dissecans of right knee   ? Multinodular goiter 12/31/2020  ? Fibroids 04/03/2020  ? Patellofemoral syndrome of right knee 11/24/2019  ? Nonallopathic lesion of lumbar region 09/09/2019  ? Nonallopathic lesion of sacral region 09/09/2019  ? Nonallopathic lesion of thoracic region 09/09/2019  ? Nonallopathic lesion of rib cage 09/09/2019  ? Nonallopathic lesion of cervical region 09/09/2019  ? Acute bursitis of left shoulder 09/09/2019  ? Cholecystitis, acute with cholelithiasis 05/09/2016  ? Gynecomastia, female 02/03/2013  ? Back pain, acute 06/22/2012  ? Obesity 05/25/2010  ? Essential hypertension 05/25/2010  ? ? ?REFERRING DIAG: M95.8 (ICD-10-CM) - Osteochondral defect of femoral condyle  ? ?THERAPY DIAG:  ?Acute pain of right knee ? ?Stiffness of right knee, not elsewhere classified ? ?Other abnormalities of gait and mobility ? ?Muscle weakness (generalized) ? ?Localized edema ? ?PERTINENT HISTORY: DM, HTN, obesity ? ?PRECAUTIONS: Fall ? ?SUBJECTIVE: Rt anterior and lateral knee discomfort is still present but not really pain. ?PAIN:  ?Are you having pain? No ?NPRS scale: 0/10  ?Pain location: knee ?Pain orientation: Right and Anterior  ?PAIN TYPE: tight ?Pain description: intermittent  ?Aggravating factors: letting knee stay in one position, when first gets up after sitting ?Relieving factors: moving knee ? ?OBJECTIVE:  ?  ?DIAGNOSTIC FINDINGS: MRI: 1.5 cm AP x 1.0 cm TR osteochondral defect of the mesial central ?medial femoral condyle with the displaced fragment in the ?suprapatellar joint space. ?  ?  PATIENT SURVEYS:  ?06/21/21 FOTO 40 (predicted 66) ?  ?LE AROM/PROM: ?  ?A/PROM Right ?06/21/21 Left ?06/21/21 Right ?07/02/21 Right ?07/11/21  ?Hip flexion        ?Hip extension        ?Hip abduction        ?Hip adduction        ?Hip internal rotation        ?Hip external rotation        ?Knee flexion A 101 ?P 107 A 137 A 118 A:124   ?Knee extension A -11 (LAQ) ?P 0 A 5 A -2  ?(LAQ) 0  ?Ankle dorsiflexion        ?Ankle plantarflexion        ?Ankle inversion        ?Ankle eversion        ?(Blank rows = not tested) ?  ?LE MMT: ?     2/23/23Deferred due to post op status ?MMT Right ?07/11/21 Left ?07/11/21   ?Hip flexion  5 5    ?Hip extension       ?Hip abduction 5  5    ?Hip adduction 5  5    ?Hip internal rotation       ?Hip external rotation       ?Knee flexion  5 5    ?Knee extension 5-   5   ?Ankle dorsiflexion       ?Ankle plantarflexion       ?Ankle inversion       ?Ankle eversion       ?(Blank rows = not tested) ? ?GAIT: ?07/11/21: gait now WNL ?07/04/21: ? Amb without AD independently; mild antalgic gait first few steps normalizing with increased distance ?  ?EDEMA: 06/21/2021  swelling noted in Rt knee ?  ?  ?TODAY'S TREATMENT: ?07/11/21 ?Therapeutic Exercise: ?Aerobic: NuStep L6 for 8 min, LEs only ?Supine: SLR on Rt 3# 2X10, SL bridge on Rt 2X10 holding 2 sec ?Seated:  ? Rt SLR 2x10, 3#, Rt LAQ 5# 2X10 ?Machines: ? Leg Press 75# 3x10 ?Standing: ? Calf raises (focus on RLE) 3x10, off 4" step ? SLS with slider vectors X 5  ?RLE RDL with 10# KB 2x10 ?Modalities: Vaso Rt knee x 10 min; mod pressure, 34 deg ? ?07/09/21 ?Therapeutic Exercise: ?Aerobic: NuStep L6 for 8 min, LEs only ?Seated:  ? Rt SLR 3x10, 4# ?Machines: ? Leg Press 75# 3x10 ?Standing: ? SLS on foam (RLE only with 1 finger UE support) with LLE movement fwd/lateral/back 2 x 10 reps each ? Calf raises (focus on RLE) 3x10, off 4" step ? SLS on Rt with LLE taps to Blaze Pods 5 cycles x 40 sec each, 20 sec rest between cycles ? RLE RDL with 10# KB 3x10 ?Modalities: Vaso Rt knee x 10 min; mod pressure, 34 deg ? ?07/04/21 ?Therapeutic Exercise: ?Aerobic: recumbent bike seat 7 level 4 for 8 min ?Seated:  ? Rt SLR 3x10, 2# ?Machines: ? Leg Press 75# 3x10 ?Standing: ? Heel taps with RLE on 4" step 3x10 ? SLS on foam (RLE only with 1 finger UE support) with LLE movement fwd/lateral/back 2 x 10  reps each ? Calf raises (focus on RLE) 3x10 ? SLS on Rt with LLE taps to Blaze Pods 4 cycles x 30 sec each, 15 sec rest between cycles ?Modalities: Vaso Rt knee x 10 min; mod pressure, 34 deg ? ? ? ? ?  ?PATIENT EDUCATION:  ?Education details: HEP ?Person educated: Patient ?Education method: Explanation,  Demonstration, and Handouts ?Education comprehension: verbalized understanding, returned demonstration, and needs further education ?  ?  ?HOME EXERCISE PROGRAM: ?Access Code: [email or web address] ?URL: [email or web address] ?Date: 06/21/2021 ?Prepared by: Faustino Congress ?  ?Program Notes ?[email or web address] : [place] Stretching Strap Yoga Strap Physical Therapy for Home Workout, Exercise, Pilates and Gymnastics, 10 Loops Non-Elastic Stretch Bands with Workout Guide for Women & Men. : Sports & Outdoors ?  ?  ?  ?Exercises ?Long Sitting Quad Set - 5-10 x daily - 7 x weekly - 1 sets - 5-10 reps - 5 sec hold ?Supine Heel Slide with Strap - 5-10 x daily - 7 x weekly - 1 sets - 5-10 reps ?Seated Knee Extension AROM - 5-10 x daily - 7 x weekly - 1 sets - 5-10 reps - 2-3 sec hold ?Seated Knee Flexion AAROM - 5-10 x daily - 7 x weekly - 1 sets - 10 reps - 10 sec hold ?  ?  ?ASSESSMENT: ?  ?CLINICAL IMPRESSION: ?Updated measurements show she is making excellent progress thus far with with PT. Still with mild deficits with some anterior knee discomfort and would recommend to continue PT to maximize function. ? ?OBJECTIVE IMPAIRMENTS Abnormal gait, decreased activity tolerance, decreased balance, decreased endurance, decreased knowledge of use of DME, decreased mobility, difficulty walking, decreased ROM, decreased strength, increased edema, and pain.  ?  ?ACTIVITY LIMITATIONS cleaning, community activity, driving, occupation, laundry, and shopping.  ?  ?PERSONAL FACTORS 3+ comorbidities: DM, HTN, obesity  are also affecting patient's functional outcome.  ?  ?REHAB POTENTIAL: Excellent ?  ?CLINICAL DECISION MAKING:  Evolving/moderate complexity ?  ?EVALUATION COMPLEXITY: Moderate ?  ?GOALS: ?Goals reviewed with patient? Yes ?  ?SHORT TERM GOALS: ?  ?STG Name Target Date Goal status  ?1 Independent with initial HEP ?Baseline:  3/23/

## 2021-07-13 ENCOUNTER — Ambulatory Visit (INDEPENDENT_AMBULATORY_CARE_PROVIDER_SITE_OTHER): Payer: Federal, State, Local not specified - PPO | Admitting: Family Medicine

## 2021-07-13 ENCOUNTER — Encounter: Payer: Self-pay | Admitting: Family Medicine

## 2021-07-13 ENCOUNTER — Encounter: Payer: Self-pay | Admitting: Orthopedic Surgery

## 2021-07-13 VITALS — BP 118/72 | HR 80 | Temp 98.4°F | Ht 65.5 in | Wt 230.1 lb

## 2021-07-13 DIAGNOSIS — I10 Essential (primary) hypertension: Secondary | ICD-10-CM

## 2021-07-13 DIAGNOSIS — E1165 Type 2 diabetes mellitus with hyperglycemia: Secondary | ICD-10-CM | POA: Diagnosis not present

## 2021-07-13 DIAGNOSIS — E785 Hyperlipidemia, unspecified: Secondary | ICD-10-CM | POA: Diagnosis not present

## 2021-07-13 NOTE — Progress Notes (Signed)
?Michele [name] ?DOB: 1976-02-02 ?Encounter date: 07/13/2021 ? ?This is a 46 y.o. female who presents with ?Chief Complaint  ?Patient presents with  ? Follow-up  ? ? ?History of present illness: ?Had surgery at beginning of year. Walking again. Still in therapy. Doing well with this. Right knee- clean out, bone grafting. Still some swelling.  ? ?Diet has been doing well. She does wake up thirsty.  ? ?Sleep is still affected due to knee/recovery. But since making it through first few weeks, she is doing better with sleep.  ? ?Htn: lisinopril-hctz 20-12.5 daily.  ? ?DMII: new dx with A1C of 6.5 in November. She does test sugar in the morning; this morning was 133. Stays around this range after eating.  ? ?No Known Allergies ?Current Meds  ?Medication Sig  ? aspirin EC 81 MG tablet Take 81 mg by mouth 2 (two) times daily. Swallow whole.  ? blood glucose meter kit and supplies KIT Dispense based on patient and insurance preference. Use up to four times daily as directed. (FOR ICD-9 250.00, 250.01).  ? chlorhexidine (PERIDEX) 0.12 % solution Use as directed 15 mLs in the mouth or throat daily as needed (gum irritation).  ? Cholecalciferol (VITAMIN D) 50 MCG (2000 UT) CAPS Take 1 capsule (2,000 Units total) by mouth daily.  ? ELDERBERRY PO Take 50 mg by mouth daily.  ? lisinopril-hydrochlorothiazide (ZESTORETIC) 20-12.5 MG tablet TAKE 1 TABLET BY MOUTH EVERY DAY  ? Multiple Vitamins-Minerals (MULTIVITAMIN ADULTS PO) Take by mouth daily.  ? ? ?Review of Systems  ?Constitutional:  Negative for chills, fatigue and fever.  ?Respiratory:  Negative for cough, chest tightness, shortness of breath and wheezing.   ?Cardiovascular:  Negative for chest pain, palpitations and leg swelling.  ? ?Objective: ? ?BP 118/72 (BP Location: Left Arm, Patient Position: Sitting, Cuff Size: Normal)   Pulse 80   Temp 98.4 ?F (36.9 ?C) (Oral)   Ht 5' 5.5" (1.664 m)   Wt 230 lb 1.6 oz (104.4 kg)   SpO2 100%   BMI 37.71 kg/m?   Weight: 230  lb 1.6 oz (104.4 kg)  ? ?BP Readings from Last 3 Encounters:  ?07/13/21 118/72  ?05/14/21 (!) 127/91  ?03/09/21 104/78  ? ?Wt Readings from Last 3 Encounters:  ?07/13/21 230 lb 1.6 oz (104.4 kg)  ?05/14/21 229 lb 4.5 oz (104 kg)  ?03/09/21 232 lb 14.4 oz (105.6 kg)  ? ? ?Physical Exam ?Constitutional:   ?   General: She is not in acute distress. ?   Appearance: She is well-developed.  ?Cardiovascular:  ?   Rate and Rhythm: Normal rate and regular rhythm.  ?   Heart sounds: Normal heart sounds. No murmur heard. ?  No friction rub.  ?Pulmonary:  ?   Effort: Pulmonary effort is normal. No respiratory distress.  ?   Breath sounds: Normal breath sounds. No wheezing or rales.  ?Musculoskeletal:  ?   Right lower leg: No edema.  ?   Left lower leg: No edema.  ?Neurological:  ?   Mental Status: She is alert and oriented to person, place, and time.  ?Psychiatric:     ?   Behavior: Behavior normal.  ? ? ?Assessment/Plan ? ?1. Controlled type 2 diabetes mellitus with hyperglycemia, without long-term current use of insulin ([place]) ?Recheck blood work today.  Patient was newly diagnosed in December and A1c was 6.5 at that time.  She prefers to not take medications if possible.  She is gradually improving with exercise ability  post knee surgery, so this should help with sugar control.  We briefly discussed medication treatment options should A1c be elevated. ?- Microalbumin / creatinine urine ratio; Future ?- Hemoglobin A1c; Future ?- Hemoglobin A1c ?- Microalbumin / creatinine urine ratio ? ?2. Essential hypertension ?Blood pressures been well controlled with lisinopril hydrochlorothiazide combination 20-12.5 mg daily. ?- CBC with Differential/Platelet; Future ?- Comprehensive metabolic panel; Future ?- Comprehensive metabolic panel ?- CBC with Differential/Platelet ? ?3. Hyperlipidemia, unspecified hyperlipidemia type ?Has been diet controlled.  Discussed stricter lipid goals if she remains diabetic. ?- Lipid panel; Future ?-  Lipid panel ? ? ?Return for bloodwork prior to physical in may. ? ? ? ? ? ?Micheline Rough, MD ?

## 2021-07-13 NOTE — Progress Notes (Signed)
? ?Post-Op Visit Note ?  ?Patient: Michele [name]           ?Date of Birth: 1975/05/21           ?MRN: [id number] ?Visit Date: 07/11/2021 ?PCP: Caren Macadam, MD ? ? ?Assessment & Plan: ? ?Chief Complaint:  ?Chief Complaint  ?Patient presents with  ? Right Knee - Follow-up  ?   right knee open osteochondral allograft for OCD lesion on 05/14/2021  ? ?Visit Diagnoses:  ?1. Osteochondral defect of femoral condyle   ? ? ?Plan: Patient presents now 2 months out right knee osteochondral defect allograft for the medial femoral condyle.  She is doing therapy 2 times a week.  This includes the bite elliptical Strengthening as well as balancing and leg lifts.  Still has a little difficulty going downstairs.  She does desk work.  I think she should be okay to return to work 2 weeks from this coming Monday.  Okay at this time to start doing some strengthening exercises including lunges and body weight squats.  Could add very light weight once this becomes easier for her.  In terms of walking I think she is okay to start building up over the next 4 weeks to a 2 mile walking track.  Start out 1/2 mile first week 1 mile second week 1/2 miles third week 2 miles last week.  4-week return for final check.  No calf tenderness negative [place] today. ? ?Follow-Up Instructions: Return in about 4 weeks (around 08/08/2021).  ? ?Orders:  ?No orders of the defined types were placed in this encounter. ? ?No orders of the defined types were placed in this encounter. ? ? ?Imaging: ?No results found. ? ?PMFS History: ?Patient Active Problem List  ? Diagnosis Date Noted  ? Osteochondritis dissecans of right knee   ? Multinodular goiter 12/31/2020  ? Fibroids 04/03/2020  ? Patellofemoral syndrome of right knee 11/24/2019  ? Nonallopathic lesion of lumbar region 09/09/2019  ? Nonallopathic lesion of sacral region 09/09/2019  ? Nonallopathic lesion of thoracic region 09/09/2019  ? Nonallopathic lesion of rib cage 09/09/2019  ? Nonallopathic  lesion of cervical region 09/09/2019  ? Acute bursitis of left shoulder 09/09/2019  ? Cholecystitis, acute with cholelithiasis 05/09/2016  ? Gynecomastia, female 02/03/2013  ? Back pain, acute 06/22/2012  ? Obesity 05/25/2010  ? Essential hypertension 05/25/2010  ? ?Past Medical History:  ?Diagnosis Date  ? Diabetes mellitus without complication ([place])   ? Hypertension   ? under control with med., has been on med. x 2 yr.  ? Hypertrophy of breast 03/2013  ? Obesity   ?  ?Family History  ?Problem Relation Age of Onset  ? Migraines Mother   ? Arthritis Mother   ? Deep vein thrombosis Mother   ?     started after ortho surgery; lifetime  ? Hyperlipidemia Mother   ? Cancer Father 76  ?     Prostate  ? Heart disease Father   ?     bypass  ? Heart attack Father 68  ? Hypertension Father   ? Headache Brother   ? Other Brother   ?     ?something with heart; under eval  ? Glaucoma Maternal Grandmother   ? Head & neck cancer Maternal Grandmother   ? Alzheimer's disease Maternal Grandfather   ? Cancer Paternal Grandmother   ?     blood?  ? Cancer Paternal Grandfather   ?     prostate  ?  Heart attack Paternal Uncle 56  ? Thyroid disease Neg Hx   ?  ?Past Surgical History:  ?Procedure Laterality Date  ? ABDOMINAL HYSTERECTOMY N/A 04/13/2020  ? Procedure: HYSTERECTOMY ABDOMINAL;  Surgeon: Delsa Bern, MD;  Location: [place];  Service: Gynecology;  Laterality: N/A;  AUTO INFUSION PROTOCOL  ? BILATERAL SALPINGECTOMY Bilateral 04/13/2020  ? Procedure: OPEN BILATERAL SALPINGECTOMY;  Surgeon: Delsa Bern, MD;  Location: [place];  Service: Gynecology;  Laterality: Bilateral;  ? BREAST REDUCTION SURGERY Bilateral 04/26/2013  ? Procedure: BILATERAL BREAST REDUCTION WITH LIPOSUCTION;  Surgeon: Cristine Polio, MD;  Location: [place];  Service: Plastics;  Laterality: Bilateral;  ? CHOLECYSTECTOMY N/A 05/10/2016  ? Procedure: LAPAROSCOPIC CHOLECYSTECTOMY WITH INTRAOPERATIVE CHOLANGIOGRAM;  Surgeon: Donnie Mesa, MD;   Location: [place];  Service: General;  Laterality: N/A;  ? FOOT SURGERY  2003; 2004  ? each foot - bunion, hammer toe  ? OSTEOCHONDRAL DEFECT REPAIR/RECONSTRUCTION Right 05/14/2021  ? Procedure: right knee osteochondral open allograft fro medial femoral defect;  Surgeon: Meredith Pel, MD;  Location: [place];  Service: Orthopedics;  Laterality: Right;  ? ?Social History  ? ?Occupational History  ? Not on file  ?Tobacco Use  ? Smoking status: Never  ? Smokeless tobacco: Never  ?Vaping Use  ? Vaping Use: Never used  ?Substance and Sexual Activity  ? Alcohol use: Yes  ?  Comment: rarely  ? Drug use: No  ? Sexual activity: Yes  ?  Birth control/protection: I.U.D.  ? ? ? ?

## 2021-07-14 LAB — CBC WITH DIFFERENTIAL/PLATELET
Absolute Monocytes: 588 cells/uL (ref 200–950)
Basophils Absolute: 32 cells/uL (ref 0–200)
Basophils Relative: 0.3 %
Eosinophils Absolute: 116 cells/uL (ref 15–500)
Eosinophils Relative: 1.1 %
HCT: 37.3 % (ref 35.0–45.0)
Hemoglobin: 12.1 g/dL (ref 11.7–15.5)
Lymphs Abs: 1995 cells/uL (ref 850–3900)
MCH: 28.1 pg (ref 27.0–33.0)
MCHC: 32.4 g/dL (ref 32.0–36.0)
MCV: 86.7 fL (ref 80.0–100.0)
MPV: 10.5 fL (ref 7.5–12.5)
Monocytes Relative: 5.6 %
Neutro Abs: 7770 cells/uL (ref 1500–7800)
Neutrophils Relative %: 74 %
Platelets: 336 10*3/uL (ref 140–400)
RBC: 4.3 10*6/uL (ref 3.80–5.10)
RDW: 13 % (ref 11.0–15.0)
Total Lymphocyte: 19 %
WBC: 10.5 10*3/uL (ref 3.8–10.8)

## 2021-07-14 LAB — LIPID PANEL
Cholesterol: 159 mg/dL (ref <200)
HDL: 48 mg/dL — ABNORMAL LOW (ref >=50)
LDL Cholesterol (Calc): 95 mg/dL (calc)
Non-HDL Cholesterol (Calc): 111 mg/dL (calc) (ref <130)
Total CHOL/HDL Ratio: 3.3 (calc) (ref <5.0)
Triglycerides: 70 mg/dL (ref <150)

## 2021-07-14 LAB — HEMOGLOBIN A1C
Hgb A1c MFr Bld: 6.1 % of total Hgb — ABNORMAL HIGH (ref <5.7)
Mean Plasma Glucose: 128 mg/dL
eAG (mmol/L): 7.1 mmol/L

## 2021-07-14 LAB — COMPREHENSIVE METABOLIC PANEL
AG Ratio: 1.4 (calc) (ref 1.0–2.5)
ALT: 12 U/L (ref 6–29)
AST: 13 U/L (ref 10–30)
Albumin: 4.2 g/dL (ref 3.6–5.1)
Alkaline phosphatase (APISO): 97 U/L (ref 31–125)
BUN: 14 mg/dL (ref 7–25)
CO2: 24 mmol/L (ref 20–32)
Calcium: 9.9 mg/dL (ref 8.6–10.2)
Chloride: 104 mmol/L (ref 98–110)
Creat: 0.75 mg/dL (ref 0.50–0.99)
Globulin: 3.1 g/dL (calc) (ref 1.9–3.7)
Glucose, Bld: 105 mg/dL — ABNORMAL HIGH (ref 65–99)
Potassium: 4 mmol/L (ref 3.5–5.3)
Sodium: 141 mmol/L (ref 135–146)
Total Bilirubin: 0.5 mg/dL (ref 0.2–1.2)
Total Protein: 7.3 g/dL (ref 6.1–8.1)

## 2021-07-14 LAB — MICROALBUMIN / CREATININE URINE RATIO
Creatinine, Urine: 83 mg/dL (ref 20–275)
Microalb Creat Ratio: 2 mcg/mg creat (ref <30)
Microalb, Ur: 0.2 mg/dL

## 2021-07-14 LAB — EXTRA URINE SPECIMEN

## 2021-07-16 ENCOUNTER — Encounter: Payer: Self-pay | Admitting: Physical Therapy

## 2021-07-16 ENCOUNTER — Ambulatory Visit: Payer: Federal, State, Local not specified - PPO | Admitting: Physical Therapy

## 2021-07-16 ENCOUNTER — Other Ambulatory Visit: Payer: Self-pay

## 2021-07-16 DIAGNOSIS — M6281 Muscle weakness (generalized): Secondary | ICD-10-CM

## 2021-07-16 DIAGNOSIS — M25661 Stiffness of right knee, not elsewhere classified: Secondary | ICD-10-CM

## 2021-07-16 DIAGNOSIS — R2689 Other abnormalities of gait and mobility: Secondary | ICD-10-CM | POA: Diagnosis not present

## 2021-07-16 DIAGNOSIS — M25561 Pain in right knee: Secondary | ICD-10-CM | POA: Diagnosis not present

## 2021-07-16 DIAGNOSIS — R600 Localized edema: Secondary | ICD-10-CM

## 2021-07-16 NOTE — Therapy (Signed)
?OUTPATIENT PHYSICAL THERAPY TREATMENT NOTE ? ? ?Patient Name: Michele [name] ?MRN: [id number] ?DOB:18-Apr-1976, 46 y.o., female ?Today's Date: 07/16/2021 ? ?PCP: Caren Macadam, MD ?REFERRING PROVIDER: Meredith Pel, MD ? ? PT End of Session - 07/16/21 1512   ? ? Visit Number 8   ? Number of Visits 16   ? Date for PT Re-Evaluation 08/16/21   ? PT Start Time 1509   ? PT Stop Time 1751   ? PT Time Calculation (min) 46 min   ? Activity Tolerance Patient tolerated treatment well   ? Behavior During Therapy [facility] for tasks assessed/performed   ? ?  ?  ? ?  ? ? ? ? ? ? ? ?Past Medical History:  ?Diagnosis Date  ? Diabetes mellitus without complication ([place])   ? Hypertension   ? under control with med., has been on med. x 2 yr.  ? Hypertrophy of breast 03/2013  ? Obesity   ? ?Past Surgical History:  ?Procedure Laterality Date  ? ABDOMINAL HYSTERECTOMY N/A 04/13/2020  ? Procedure: HYSTERECTOMY ABDOMINAL;  Surgeon: Delsa Bern, MD;  Location: [place];  Service: Gynecology;  Laterality: N/A;  AUTO INFUSION PROTOCOL  ? BILATERAL SALPINGECTOMY Bilateral 04/13/2020  ? Procedure: OPEN BILATERAL SALPINGECTOMY;  Surgeon: Delsa Bern, MD;  Location: [place];  Service: Gynecology;  Laterality: Bilateral;  ? BREAST REDUCTION SURGERY Bilateral 04/26/2013  ? Procedure: BILATERAL BREAST REDUCTION WITH LIPOSUCTION;  Surgeon: Cristine Polio, MD;  Location: [place];  Service: Plastics;  Laterality: Bilateral;  ? CHOLECYSTECTOMY N/A 05/10/2016  ? Procedure: LAPAROSCOPIC CHOLECYSTECTOMY WITH INTRAOPERATIVE CHOLANGIOGRAM;  Surgeon: Donnie Mesa, MD;  Location: [place];  Service: General;  Laterality: N/A;  ? FOOT SURGERY  2003; 2004  ? each foot - bunion, hammer toe  ? OSTEOCHONDRAL DEFECT REPAIR/RECONSTRUCTION Right 05/14/2021  ? Procedure: right knee osteochondral open allograft fro medial femoral defect;  Surgeon: Meredith Pel, MD;  Location: [place];  Service: Orthopedics;  Laterality:  Right;  ? ?Patient Active Problem List  ? Diagnosis Date Noted  ? Osteochondritis dissecans of right knee   ? Multinodular goiter 12/31/2020  ? Fibroids 04/03/2020  ? Patellofemoral syndrome of right knee 11/24/2019  ? Nonallopathic lesion of lumbar region 09/09/2019  ? Nonallopathic lesion of sacral region 09/09/2019  ? Nonallopathic lesion of thoracic region 09/09/2019  ? Nonallopathic lesion of rib cage 09/09/2019  ? Nonallopathic lesion of cervical region 09/09/2019  ? Acute bursitis of left shoulder 09/09/2019  ? Cholecystitis, acute with cholelithiasis 05/09/2016  ? Gynecomastia, female 02/03/2013  ? Back pain, acute 06/22/2012  ? Obesity 05/25/2010  ? Essential hypertension 05/25/2010  ? ? ?REFERRING DIAG: M95.8 (ICD-10-CM) - Osteochondral defect of femoral condyle  ? ?THERAPY DIAG:  ?Acute pain of right knee ? ?Stiffness of right knee, not elsewhere classified ? ?Other abnormalities of gait and mobility ? ?Muscle weakness (generalized) ? ?Localized edema ? ?PERTINENT HISTORY: DM, HTN, obesity ? ?PRECAUTIONS: Fall ? ?SUBJECTIVE: able to start some squats and lunges, no jumping  ?PAIN:  ?Are you having pain? No ?NPRS scale: 0/10  ?Pain location: knee ?Pain orientation: Right and Anterior  ?PAIN TYPE: tight ?Pain description: intermittent  ?Aggravating factors: letting knee stay in one position, when first gets up after sitting ?Relieving factors: moving knee ? ?OBJECTIVE:  ?  ?DIAGNOSTIC FINDINGS: MRI: 1.5 cm AP x 1.0 cm TR osteochondral defect of the mesial central ?medial femoral condyle with the displaced fragment in the ?suprapatellar joint space. ?  ?  PATIENT SURVEYS:  ?06/21/21 FOTO 40 (predicted 66) ?  ?LE AROM/PROM: ?  ?A/PROM Right ?06/21/21 Left ?06/21/21 Right ?07/02/21 Right ?07/11/21  ?Hip flexion        ?Hip extension        ?Hip abduction        ?Hip adduction        ?Hip internal rotation        ?Hip external rotation        ?Knee flexion A 101 ?P 107 A 137 A 118 A:124  ?Knee extension A -11 (LAQ) ?P  0 A 5 A -2  ?(LAQ) 0  ?Ankle dorsiflexion        ?Ankle plantarflexion        ?Ankle inversion        ?Ankle eversion        ?(Blank rows = not tested) ?  ?LE MMT: ?     2/23/23Deferred due to post op status ?MMT Right ?07/11/21 Left ?07/11/21   ?Hip flexion  5 5    ?Hip extension       ?Hip abduction 5  5    ?Hip adduction 5  5    ?Hip internal rotation       ?Hip external rotation       ?Knee flexion  5 5    ?Knee extension 5-   5   ?Ankle dorsiflexion       ?Ankle plantarflexion       ?Ankle inversion       ?Ankle eversion       ?(Blank rows = not tested) ? ?GAIT: ?07/11/21: gait now WNL ?07/04/21: ? Amb without AD independently; mild antalgic gait first few steps normalizing with increased distance ?  ?EDEMA: 06/21/2021  swelling noted in Rt knee ?  ?  ?TODAY'S TREATMENT: ?07/16/21 ?Therapeutic Exercise: ?Aerobic: NuStep L8 for 8 min, LEs only ? ?Machines: ? Leg Press 75# 3x10 ? Knee Extension 10# 3x10 (min/mod A from LLE) ?  ?Standing: ? Squats 3x10 on ramp (heels elevated) ?Calf raises (focus on RLE) 3x10, off incline board ?Alternating lunges, partial range x 10 reps bil ? RLE RDL with 10# KB 2x10 ? ?Modalities: Vaso Rt knee x 10 min; mod pressure, 34 deg ? ?07/11/21 ?Therapeutic Exercise: ?Aerobic: NuStep L6 for 8 min, LEs only ?Supine: SLR on Rt 3# 2X10, SL bridge on Rt 2X10 holding 2 sec ?Seated:  ? Rt SLR 2x10, 3#, Rt LAQ 5# 2X10 ?Machines: ? Leg Press 75# 3x10 ?Standing: ? Calf raises (focus on RLE) 3x10, off 4" step ? SLS with slider vectors X 5  ?RLE RDL with 10# KB 2x10 ?Modalities: Vaso Rt knee x 10 min; mod pressure, 34 deg ? ?07/09/21 ?Therapeutic Exercise: ?Aerobic: NuStep L6 for 8 min, LEs only ?Seated:  ? Rt SLR 3x10, 4# ?Machines: ? Leg Press 75# 3x10 ?Standing: ? SLS on foam (RLE only with 1 finger UE support) with LLE movement fwd/lateral/back 2 x 10 reps each ? Calf raises (focus on RLE) 3x10, off 4" step ? SLS on Rt with LLE taps to [employer] 5 cycles x 40 sec each, 20 sec rest between cycles ? RLE  RDL with 10# KB 3x10 ?Modalities: Vaso Rt knee x 10 min; mod pressure, 34 deg ?  ?PATIENT EDUCATION:  ?Education details: HEP ?Person educated: Patient ?Education method: Explanation, Demonstration, and Handouts ?Education comprehension: verbalized understanding, returned demonstration, and needs further education ?  ?  ?HOME EXERCISE PROGRAM: ?Access Code: [email or web address] ?URL: [email or web address] ?Date:   06/21/2021 ?Prepared by: Stephanie Matthews ?  ?Program Notes ?[email or web address] : [employer] Stretching Strap Yoga Strap Physical Therapy for Home Workout, Exercise, Pilates and Gymnastics, 10 Loops Non-Elastic Stretch Bands with Workout Guide for Women & Men. : Sports & Outdoors ?  ?  ?  ?Exercises ?Long Sitting Quad Set - 5-10 x daily - 7 x weekly - 1 sets - 5-10 reps - 5 sec hold ?Supine Heel Slide with Strap - 5-10 x daily - 7 x weekly - 1 sets - 5-10 reps ?Seated Knee Extension AROM - 5-10 x daily - 7 x weekly - 1 sets - 5-10 reps - 2-3 sec hold ?Seated Knee Flexion AAROM - 5-10 x daily - 7 x weekly - 1 sets - 10 reps - 10 sec hold ?  ?  ?ASSESSMENT: ?  ?CLINICAL IMPRESSION: ?Progressed strengthening exercises today as allowed by MD, with expected muscle fatigue noted at the end of session.  Will continue to benefit from PT, and may be ready to decrease frequency or transition to community fitness in the coming weeks.   ? ?OBJECTIVE IMPAIRMENTS Abnormal gait, decreased activity tolerance, decreased balance, decreased endurance, decreased knowledge of use of DME, decreased mobility, difficulty walking, decreased ROM, decreased strength, increased edema, and pain.  ?  ?ACTIVITY LIMITATIONS cleaning, community activity, driving, occupation, laundry, and shopping.  ?  ?PERSONAL FACTORS 3+ comorbidities: DM, HTN, obesity  are also affecting patient's functional outcome.  ?  ?REHAB POTENTIAL: Excellent ?  ?CLINICAL DECISION MAKING: Evolving/moderate complexity ?  ?EVALUATION COMPLEXITY: Moderate ?  ?GOALS: ?Goals  reviewed with patient? Yes ?  ?SHORT TERM GOALS: ?  ?STG Name Target Date Goal status  ?1 Independent with initial HEP ?Baseline:  07/19/2021 MET  ?2 Rt knee AROM improved 0-110 for improved function ?

## 2021-07-18 ENCOUNTER — Ambulatory Visit: Payer: Federal, State, Local not specified - PPO | Admitting: Physical Therapy

## 2021-07-18 ENCOUNTER — Other Ambulatory Visit: Payer: Self-pay

## 2021-07-18 ENCOUNTER — Encounter: Payer: Self-pay | Admitting: Physical Therapy

## 2021-07-18 DIAGNOSIS — M25561 Pain in right knee: Secondary | ICD-10-CM

## 2021-07-18 DIAGNOSIS — M6281 Muscle weakness (generalized): Secondary | ICD-10-CM | POA: Diagnosis not present

## 2021-07-18 DIAGNOSIS — M25661 Stiffness of right knee, not elsewhere classified: Secondary | ICD-10-CM | POA: Diagnosis not present

## 2021-07-18 DIAGNOSIS — R2689 Other abnormalities of gait and mobility: Secondary | ICD-10-CM

## 2021-07-18 DIAGNOSIS — R600 Localized edema: Secondary | ICD-10-CM

## 2021-07-18 NOTE — Therapy (Signed)
?OUTPATIENT PHYSICAL THERAPY TREATMENT NOTE ? ? ?Patient Name: Michele [name] ?MRN: [id number] ?DOB:[birth year], 46 y.o., female ?Today's Date: 07/18/2021 ? ?PCP: Caren Macadam, MD ?REFERRING PROVIDER: Meredith Pel, MD ? ? PT End of Session - 07/18/21 1514   ? ? Visit Number 9   ? Number of Visits 16   ? Date for PT Re-Evaluation 08/16/21   ? PT Start Time 1511   ? PT Stop Time 1600   ? PT Time Calculation (min) 49 min   ? Activity Tolerance Patient tolerated treatment well   ? Behavior During Therapy [facility] for tasks assessed/performed   ? ?  ?  ? ?  ? ? ? ? ? ? ? ? ?Past Medical History:  ?Diagnosis Date  ? Diabetes mellitus without complication ([place])   ? Hypertension   ? under control with med., has been on med. x 2 yr.  ? Hypertrophy of breast 03/2013  ? Obesity   ? ?Past Surgical History:  ?Procedure Laterality Date  ? ABDOMINAL HYSTERECTOMY N/A 04/13/2020  ? Procedure: HYSTERECTOMY ABDOMINAL;  Surgeon: Delsa Bern, MD;  Location: [place];  Service: Gynecology;  Laterality: N/A;  AUTO INFUSION PROTOCOL  ? BILATERAL SALPINGECTOMY Bilateral 04/13/2020  ? Procedure: OPEN BILATERAL SALPINGECTOMY;  Surgeon: Delsa Bern, MD;  Location: [place];  Service: Gynecology;  Laterality: Bilateral;  ? BREAST REDUCTION SURGERY Bilateral 04/26/2013  ? Procedure: BILATERAL BREAST REDUCTION WITH LIPOSUCTION;  Surgeon: Cristine Polio, MD;  Location: [place];  Service: Plastics;  Laterality: Bilateral;  ? CHOLECYSTECTOMY N/A 05/10/2016  ? Procedure: LAPAROSCOPIC CHOLECYSTECTOMY WITH INTRAOPERATIVE CHOLANGIOGRAM;  Surgeon: Donnie Mesa, MD;  Location: [place];  Service: General;  Laterality: N/A;  ? FOOT SURGERY  2003; 2004  ? each foot - bunion, hammer toe  ? OSTEOCHONDRAL DEFECT REPAIR/RECONSTRUCTION Right 05/14/2021  ? Procedure: right knee osteochondral open allograft fro medial femoral defect;  Surgeon: Meredith Pel, MD;  Location: [place];  Service: Orthopedics;   Laterality: Right;  ? ?Patient Active Problem List  ? Diagnosis Date Noted  ? Osteochondritis dissecans of right knee   ? Multinodular goiter 12/31/2020  ? Fibroids 04/03/2020  ? Patellofemoral syndrome of right knee 11/24/2019  ? Nonallopathic lesion of lumbar region 09/09/2019  ? Nonallopathic lesion of sacral region 09/09/2019  ? Nonallopathic lesion of thoracic region 09/09/2019  ? Nonallopathic lesion of rib cage 09/09/2019  ? Nonallopathic lesion of cervical region 09/09/2019  ? Acute bursitis of left shoulder 09/09/2019  ? Cholecystitis, acute with cholelithiasis 05/09/2016  ? Gynecomastia, female 02/03/2013  ? Back pain, acute 06/22/2012  ? Obesity 05/25/2010  ? Essential hypertension 05/25/2010  ? ? ?REFERRING DIAG: M95.8 (ICD-10-CM) - Osteochondral defect of femoral condyle  ? ?THERAPY DIAG:  ?Acute pain of right knee ? ?Stiffness of right knee, not elsewhere classified ? ?Other abnormalities of gait and mobility ? ?Muscle weakness (generalized) ? ?Localized edema ? ?PERTINENT HISTORY: DM, HTN, obesity ? ?PRECAUTIONS: Fall ? ?SUBJECTIVE: able to start some squats and lunges, no jumping  ?PAIN:  ?Are you having pain? No ?NPRS scale: 0/10  ?Pain location: knee ?Pain orientation: Right and Anterior  ?PAIN TYPE: tight ?Pain description: intermittent  ?Aggravating factors: letting knee stay in one position, when first gets up after sitting ?Relieving factors: moving knee ? ?OBJECTIVE:  ?  ?DIAGNOSTIC FINDINGS: MRI: 1.5 cm AP x 1.0 cm TR osteochondral defect of the mesial central ?medial femoral condyle with the displaced fragment in the ?suprapatellar joint space. ?  ?  PATIENT SURVEYS:  ?06/21/21 FOTO 40 (predicted 66) ?  ?LE AROM/PROM: ?  ?A/PROM Right ?06/21/21 Left ?06/21/21 Right ?07/02/21 Right ?07/11/21  ?Hip flexion        ?Hip extension        ?Hip abduction        ?Hip adduction        ?Hip internal rotation        ?Hip external rotation        ?Knee flexion A 101 ?P 107 A 137 A 118 A:124  ?Knee extension A  -11 (LAQ) ?P 0 A 5 A -2  ?(LAQ) 0  ?Ankle dorsiflexion        ?Ankle plantarflexion        ?Ankle inversion        ?Ankle eversion        ?(Blank rows = not tested) ?  ?LE MMT: ?     2/23/23Deferred due to post op status ?MMT Right ?07/11/21 Left ?07/11/21   ?Hip flexion  5 5    ?Hip extension       ?Hip abduction 5  5    ?Hip adduction 5  5    ?Hip internal rotation       ?Hip external rotation       ?Knee flexion  5 5    ?Knee extension 5-   5   ?Ankle dorsiflexion       ?Ankle plantarflexion       ?Ankle inversion       ?Ankle eversion       ?(Blank rows = not tested) ? ?GAIT: ?07/11/21: gait now WNL ?07/04/21: ? Amb without AD independently; mild antalgic gait first few steps normalizing with increased distance ?  ?EDEMA: 06/21/2021  swelling noted in Rt knee ?  ?  ?TODAY'S TREATMENT: ?07/18/21 ?Therapeutic Exercise: ?Aerobic: Recumbent Bike Seat 7 L5 x 8 min ? ?Machines: ? Leg Press 100# 3x10 ? Knee Extension 10# 3x10 (min/mod A from LLE) ?  ?Standing: ? Squats 2x10 with TRX ? Lateral lunges with TRX 2x10 bil ? Curtsy lunges with TRX x10 ? ?Modalities: Vaso Rt knee x 10 min; mod pressure, 34 deg ? ?07/16/21 ?Therapeutic Exercise: ?Aerobic: NuStep L8 for 8 min, LEs only ? ?Machines: ? Leg Press 75# 3x10 ? Knee Extension 10# 3x10 (min/mod A from LLE) ?  ?Standing: ? Squats 3x10 on ramp (heels elevated) ?Calf raises (focus on RLE) 3x10, off incline board ?Alternating lunges, partial range x 10 reps bil ? RLE RDL with 10# KB 2x10 ? ?Modalities: Vaso Rt knee x 10 min; mod pressure, 34 deg ? ?07/11/21 ?Therapeutic Exercise: ?Aerobic: NuStep L6 for 8 min, LEs only ?Supine: SLR on Rt 3# 2X10, SL bridge on Rt 2X10 holding 2 sec ?Seated:  ? Rt SLR 2x10, 3#, Rt LAQ 5# 2X10 ?Machines: ? Leg Press 75# 3x10 ?Standing: ? Calf raises (focus on RLE) 3x10, off 4" step ? SLS with slider vectors X 5  ?RLE RDL with 10# KB 2x10 ?Modalities: Vaso Rt knee x 10 min; mod pressure, 34 deg ? ? ? ?PATIENT EDUCATION:  ?Education details: HEP ?Person  educated: Patient ?Education method: Explanation, Demonstration, and Handouts ?Education comprehension: verbalized understanding, returned demonstration, and needs further education ?  ?  ?HOME EXERCISE PROGRAM: ?Access Code: [email or web address] ?URL: [email or web address] ?Date: 06/21/2021 ?Prepared by: Faustino Congress ?  ?Program Notes ?[email or web address] : [employer] Stretching Strap Yoga Strap Physical Therapy for Home Workout, Exercise, Pilates and Gymnastics, 10 Loops Non-Elastic Stretch Bands with Workout  Guide for Women & Men. : Sports & Outdoors ?  ?  ?  ?Exercises ?Long Sitting Quad Set - 5-10 x daily - 7 x weekly - 1 sets - 5-10 reps - 5 sec hold ?Supine Heel Slide with Strap - 5-10 x daily - 7 x weekly - 1 sets - 5-10 reps ?Seated Knee Extension AROM - 5-10 x daily - 7 x weekly - 1 sets - 5-10 reps - 2-3 sec hold ?Seated Knee Flexion AAROM - 5-10 x daily - 7 x weekly - 1 sets - 10 reps - 10 sec hold ?  ?  ?ASSESSMENT: ?  ?CLINICAL IMPRESSION: ?Expected soreness reported after last session, with continuation of lunges and squats with different variety.  Progressing well with strengthening activities. ? ?OBJECTIVE IMPAIRMENTS Abnormal gait, decreased activity tolerance, decreased balance, decreased endurance, decreased knowledge of use of DME, decreased mobility, difficulty walking, decreased ROM, decreased strength, increased edema, and pain.  ?  ?ACTIVITY LIMITATIONS cleaning, community activity, driving, occupation, laundry, and shopping.  ?  ?PERSONAL FACTORS 3+ comorbidities: DM, HTN, obesity  are also affecting patient's functional outcome.  ?  ?REHAB POTENTIAL: Excellent ?  ?CLINICAL DECISION MAKING: Evolving/moderate complexity ?  ?EVALUATION COMPLEXITY: Moderate ?  ?GOALS: ?Goals reviewed with patient? Yes ?  ?SHORT TERM GOALS: ?  ?STG Name Target Date Goal status  ?1 Independent with initial HEP ?Baseline:  07/19/2021 MET  ?2 Rt knee AROM improved 0-110 for improved function ?Baseline:  07/19/2021 MET   ?  ?LONG TERM GOALS:  ?  ?LTG Name Target Date Goal status  ?1 Independent with final HEP ?Baseline: 08/16/2021 ongoing  ?2 FOTO score improved to 66 for improved function ?Baseline: 08/16/2021 ongoing  ?3 Rt kn

## 2021-07-23 ENCOUNTER — Encounter: Payer: Self-pay | Admitting: Physical Therapy

## 2021-07-23 ENCOUNTER — Ambulatory Visit: Payer: Federal, State, Local not specified - PPO | Admitting: Physical Therapy

## 2021-07-23 ENCOUNTER — Other Ambulatory Visit: Payer: Self-pay

## 2021-07-23 DIAGNOSIS — M25561 Pain in right knee: Secondary | ICD-10-CM | POA: Diagnosis not present

## 2021-07-23 DIAGNOSIS — M25661 Stiffness of right knee, not elsewhere classified: Secondary | ICD-10-CM | POA: Diagnosis not present

## 2021-07-23 DIAGNOSIS — R600 Localized edema: Secondary | ICD-10-CM

## 2021-07-23 DIAGNOSIS — M6281 Muscle weakness (generalized): Secondary | ICD-10-CM

## 2021-07-23 DIAGNOSIS — R2689 Other abnormalities of gait and mobility: Secondary | ICD-10-CM

## 2021-07-23 NOTE — Therapy (Signed)
?OUTPATIENT PHYSICAL THERAPY TREATMENT NOTE ? ? ?Patient Name: Michele [name] ?MRN: [id number] ?DOB:08-09-1975, 46 y.o., female ?Today's Date: 07/23/2021 ? ?PCP: Caren Macadam, MD ?REFERRING PROVIDER: Meredith Pel, MD ? ? PT End of Session - 07/23/21 1536   ? ? Visit Number 10   ? Number of Visits 16   ? Date for PT Re-Evaluation 08/16/21   ? PT Start Time 0093   ? PT Stop Time 8182   ? PT Time Calculation (min) 46 min   ? Activity Tolerance Patient tolerated treatment well   ? Behavior During Therapy [facility] for tasks assessed/performed   ? ?  ?  ? ?  ? ? ? ? ? ? ? ? ?Past Medical History:  ?Diagnosis Date  ? Diabetes mellitus without complication ([place])   ? Hypertension   ? under control with med., has been on med. x 2 yr.  ? Hypertrophy of breast 03/2013  ? Obesity   ? ?Past Surgical History:  ?Procedure Laterality Date  ? ABDOMINAL HYSTERECTOMY N/A 04/13/2020  ? Procedure: HYSTERECTOMY ABDOMINAL;  Surgeon: Delsa Bern, MD;  Location: [place];  Service: Gynecology;  Laterality: N/A;  AUTO INFUSION PROTOCOL  ? BILATERAL SALPINGECTOMY Bilateral 04/13/2020  ? Procedure: OPEN BILATERAL SALPINGECTOMY;  Surgeon: Delsa Bern, MD;  Location: [place];  Service: Gynecology;  Laterality: Bilateral;  ? BREAST REDUCTION SURGERY Bilateral 04/26/2013  ? Procedure: BILATERAL BREAST REDUCTION WITH LIPOSUCTION;  Surgeon: Cristine Polio, MD;  Location: [place];  Service: Plastics;  Laterality: Bilateral;  ? CHOLECYSTECTOMY N/A 05/10/2016  ? Procedure: LAPAROSCOPIC CHOLECYSTECTOMY WITH INTRAOPERATIVE CHOLANGIOGRAM;  Surgeon: Donnie Mesa, MD;  Location: [place];  Service: General;  Laterality: N/A;  ? FOOT SURGERY  2003; 2004  ? each foot - bunion, hammer toe  ? OSTEOCHONDRAL DEFECT REPAIR/RECONSTRUCTION Right 05/14/2021  ? Procedure: right knee osteochondral open allograft fro medial femoral defect;  Surgeon: Meredith Pel, MD;  Location: [place];  Service: Orthopedics;   Laterality: Right;  ? ?Patient Active Problem List  ? Diagnosis Date Noted  ? Osteochondritis dissecans of right knee   ? Multinodular goiter 12/31/2020  ? Fibroids 04/03/2020  ? Patellofemoral syndrome of right knee 11/24/2019  ? Nonallopathic lesion of lumbar region 09/09/2019  ? Nonallopathic lesion of sacral region 09/09/2019  ? Nonallopathic lesion of thoracic region 09/09/2019  ? Nonallopathic lesion of rib cage 09/09/2019  ? Nonallopathic lesion of cervical region 09/09/2019  ? Acute bursitis of left shoulder 09/09/2019  ? Cholecystitis, acute with cholelithiasis 05/09/2016  ? Gynecomastia, female 02/03/2013  ? Back pain, acute 06/22/2012  ? Obesity 05/25/2010  ? Essential hypertension 05/25/2010  ? ? ?REFERRING DIAG: M95.8 (ICD-10-CM) - Osteochondral defect of femoral condyle  ? ?THERAPY DIAG:  ?Acute pain of right knee ? ?Stiffness of right knee, not elsewhere classified ? ?Other abnormalities of gait and mobility ? ?Muscle weakness (generalized) ? ?Localized edema ? ?PERTINENT HISTORY: DM, HTN, obesity ? ?PRECAUTIONS: Fall ? ?SUBJECTIVE: She says no pain but she started back at work today so some tightness from this. ?PAIN:  ?Are you having pain? No ?NPRS scale: 0/10  ?Pain location: knee ?Pain orientation: Right and Anterior  ?PAIN TYPE: tight ?Pain description: intermittent  ?Aggravating factors: letting knee stay in one position, when first gets up after sitting ?Relieving factors: moving knee ? ?OBJECTIVE:  ?  ?DIAGNOSTIC FINDINGS: MRI: 1.5 cm AP x 1.0 cm TR osteochondral defect of the mesial central ?medial femoral condyle with the displaced fragment  in the ?suprapatellar joint space. ?  ?PATIENT SURVEYS:  ?06/21/21 FOTO 40 (predicted 66) ?  ?LE AROM/PROM: ?  ?A/PROM Right ?06/21/21 Left ?06/21/21 Right ?07/02/21 Right ?07/11/21  ?Hip flexion        ?Hip extension        ?Hip abduction        ?Hip adduction        ?Hip internal rotation        ?Hip external rotation        ?Knee flexion A 101 ?P 107 A 137 A  118 A:124  ?Knee extension A -11 (LAQ) ?P 0 A 5 A -2  ?(LAQ) 0  ?Ankle dorsiflexion        ?Ankle plantarflexion        ?Ankle inversion        ?Ankle eversion        ?(Blank rows = not tested) ?  ?LE MMT: ?     2/23/23Deferred due to post op status ?MMT Right ?07/11/21 Left ?07/11/21   ?Hip flexion  5 5    ?Hip extension       ?Hip abduction 5  5    ?Hip adduction 5  5    ?Hip internal rotation       ?Hip external rotation       ?Knee flexion  5 5    ?Knee extension 5-   5   ?Ankle dorsiflexion       ?Ankle plantarflexion       ?Ankle inversion       ?Ankle eversion       ?(Blank rows = not tested) ? ?GAIT: ?07/11/21: gait now WNL ?07/04/21: ? Amb without AD independently; mild antalgic gait first few steps normalizing with increased distance ?  ?EDEMA: 06/21/2021  swelling noted in Rt knee ?  ?  ?TODAY'S TREATMENT: ?07/23/21 ?Therapeutic Exercise: ?Aerobic: Nu step L7 X 6:30 min LE only ? ?Machines: ? Knee Extension 10# 3x10 (min/mod A from LLE) ? Hamstring curl machine DL 35# 3X10 ?  ?Standing: ? Gastroc stretch 30 sec X 2 on slantboard ? Quad stretch on Rt 2X30 sec ? Squats 2x10 on ball on wall with 5 sec hold ? Decline squats 10# 2X10 ? Lateral lunges x10 bil ?  ? ?Modalities: Vaso Rt knee x 10 min; mod pressure, 34 deg ? ?07/18/21 ?Therapeutic Exercise: ?Aerobic: Recumbent Bike Seat 7 L5 x 8 min ? ?Machines: ? Leg Press 100# 3x10 ? Knee Extension 10# 3x10 (min/mod A from LLE) ?  ?Standing: ? Squats 2x10 with TRX ? Lateral lunges with TRX 2x10 bil ? Curtsy lunges with TRX x10 ? ?Modalities: Vaso Rt knee x 10 min; mod pressure, 34 deg ? ?07/16/21 ?Therapeutic Exercise: ?Aerobic: NuStep L8 for 8 min, LEs only ? ?Machines: ? Leg Press 75# 3x10 ? Knee Extension 10# 3x10 (min/mod A from LLE) ?  ?Standing: ? Squats 3x10 on ramp (heels elevated) ?Calf raises (focus on RLE) 3x10, off incline board ?Alternating lunges, partial range x 10 reps bil ? RLE RDL with 10# KB 2x10 ? ?Modalities: Vaso Rt knee x 10 min; mod pressure, 34  deg ? ? ?PATIENT EDUCATION:  ?Education details: HEP ?Person educated: Patient ?Education method: Explanation, Demonstration, and Handouts ?Education comprehension: verbalized understanding, returned demonstration, and needs further education ?  ?  ?HOME EXERCISE PROGRAM: ?Access Code: [email or web address] ?URL: [email or web address] ?Date: 06/21/2021 ?Prepared by: Faustino Congress ?  ?Program Notes ?[email or web address] : [facility]ret[employer] Strap Physical Therapy for Home  Workout, Exercise, Pilates and Gymnastics, 10 Loops Non-Elastic Stretch Bands with Workout Guide for Women & Men. : Sports & Outdoors ?  ?  ?  ?Exercises ?Long Sitting Quad Set - 5-10 x daily - 7 x weekly - 1 sets - 5-10 reps - 5 sec hold ?Supine Heel Slide with Strap - 5-10 x daily - 7 x weekly - 1 sets - 5-10 reps ?Seated Knee Extension AROM - 5-10 x daily - 7 x weekly - 1 sets - 5-10 reps - 2-3 sec hold ?Seated Knee Flexion AAROM - 5-10 x daily - 7 x weekly - 1 sets - 10 reps - 10 sec hold ?  ?  ?ASSESSMENT: ?  ?CLINICAL IMPRESSION: ?Some tightness from being back at work but able to tolerate strength progressions well today. She does still have some fatigue noted but is progressing well with functional strength. Continue POC ? ?OBJECTIVE IMPAIRMENTS Abnormal gait, decreased activity tolerance, decreased balance, decreased endurance, decreased knowledge of use of DME, decreased mobility, difficulty walking, decreased ROM, decreased strength, increased edema, and pain.  ?  ?ACTIVITY LIMITATIONS cleaning, community activity, driving, occupation, laundry, and shopping.  ?  ?PERSONAL FACTORS 3+ comorbidities: DM, HTN, obesity  are also affecting patient's functional outcome.  ?  ?REHAB POTENTIAL: Excellent ?  ?CLINICAL DECISION MAKING: Evolving/moderate complexity ?  ?EVALUATION COMPLEXITY: Moderate ?  ?GOALS: ?Goals reviewed with patient? Yes ?  ?SHORT TERM GOALS: ?  ?STG Name Target Date Goal status  ?1 Independent with initial  HEP ?Baseline:  07/19/2021 MET  ?2 Rt knee AROM improved 0-110 for improved function ?Baseline:  07/19/2021 MET  ?  ?LONG TERM GOALS:  ?  ?LTG Name Target Date Goal status  ?1 Independent with final HEP ?Baseline: 08/16/2021

## 2021-07-25 ENCOUNTER — Encounter: Payer: Self-pay | Admitting: Physical Therapy

## 2021-07-25 ENCOUNTER — Ambulatory Visit: Payer: Federal, State, Local not specified - PPO | Admitting: Physical Therapy

## 2021-07-25 DIAGNOSIS — M6281 Muscle weakness (generalized): Secondary | ICD-10-CM | POA: Diagnosis not present

## 2021-07-25 DIAGNOSIS — R600 Localized edema: Secondary | ICD-10-CM

## 2021-07-25 DIAGNOSIS — M25661 Stiffness of right knee, not elsewhere classified: Secondary | ICD-10-CM | POA: Diagnosis not present

## 2021-07-25 DIAGNOSIS — R2689 Other abnormalities of gait and mobility: Secondary | ICD-10-CM

## 2021-07-25 DIAGNOSIS — M25561 Pain in right knee: Secondary | ICD-10-CM | POA: Diagnosis not present

## 2021-07-25 NOTE — Therapy (Signed)
?OUTPATIENT PHYSICAL THERAPY TREATMENT NOTE ? ? ?Patient Name: Michele [name] ?MRN: [id number] ?DOB:08/07/1975, 46 y.o., female ?Today's Date: 07/25/2021 ? ?PCP: Caren Macadam, MD ?REFERRING PROVIDER: Meredith Pel, MD ? ? PT End of Session - 07/25/21 1517   ? ? Visit Number 11   ? Number of Visits 16   ? Date for PT Re-Evaluation 08/16/21   ? PT Start Time 1515   ? PT Stop Time 0102   ? PT Time Calculation (min) 51 min   ? Activity Tolerance Patient tolerated treatment well   ? Behavior During Therapy [facility] for tasks assessed/performed   ? ?  ?  ? ?  ? ? ? ? ? ? ? ? ? ?Past Medical History:  ?Diagnosis Date  ? Diabetes mellitus without complication ([place])   ? Hypertension   ? under control with med., has been on med. x 2 yr.  ? Hypertrophy of breast 03/2013  ? Obesity   ? ?Past Surgical History:  ?Procedure Laterality Date  ? ABDOMINAL HYSTERECTOMY N/A 04/13/2020  ? Procedure: HYSTERECTOMY ABDOMINAL;  Surgeon: Delsa Bern, MD;  Location: [place];  Service: Gynecology;  Laterality: N/A;  AUTO INFUSION PROTOCOL  ? BILATERAL SALPINGECTOMY Bilateral 04/13/2020  ? Procedure: OPEN BILATERAL SALPINGECTOMY;  Surgeon: Delsa Bern, MD;  Location: [place];  Service: Gynecology;  Laterality: Bilateral;  ? BREAST REDUCTION SURGERY Bilateral 04/26/2013  ? Procedure: BILATERAL BREAST REDUCTION WITH LIPOSUCTION;  Surgeon: Cristine Polio, MD;  Location: [place];  Service: Plastics;  Laterality: Bilateral;  ? CHOLECYSTECTOMY N/A 05/10/2016  ? Procedure: LAPAROSCOPIC CHOLECYSTECTOMY WITH INTRAOPERATIVE CHOLANGIOGRAM;  Surgeon: Donnie Mesa, MD;  Location: [place];  Service: General;  Laterality: N/A;  ? FOOT SURGERY  2003; 2004  ? each foot - bunion, hammer toe  ? OSTEOCHONDRAL DEFECT REPAIR/RECONSTRUCTION Right 05/14/2021  ? Procedure: right knee osteochondral open allograft fro medial femoral defect;  Surgeon: Meredith Pel, MD;  Location: [place];  Service: Orthopedics;   Laterality: Right;  ? ?Patient Active Problem List  ? Diagnosis Date Noted  ? Osteochondritis dissecans of right knee   ? Multinodular goiter 12/31/2020  ? Fibroids 04/03/2020  ? Patellofemoral syndrome of right knee 11/24/2019  ? Nonallopathic lesion of lumbar region 09/09/2019  ? Nonallopathic lesion of sacral region 09/09/2019  ? Nonallopathic lesion of thoracic region 09/09/2019  ? Nonallopathic lesion of rib cage 09/09/2019  ? Nonallopathic lesion of cervical region 09/09/2019  ? Acute bursitis of left shoulder 09/09/2019  ? Cholecystitis, acute with cholelithiasis 05/09/2016  ? Gynecomastia, female 02/03/2013  ? Back pain, acute 06/22/2012  ? Obesity 05/25/2010  ? Essential hypertension 05/25/2010  ? ? ?REFERRING DIAG: M95.8 (ICD-10-CM) - Osteochondral defect of femoral condyle  ? ?THERAPY DIAG:  ?Acute pain of right knee ? ?Stiffness of right knee, not elsewhere classified ? ?Other abnormalities of gait and mobility ? ?Muscle weakness (generalized) ? ?Localized edema ? ?PERTINENT HISTORY: DM, HTN, obesity ? ?PRECAUTIONS: Fall ? ?SUBJECTIVE: knee is going well  ?PAIN:  ?Are you having pain? No ?NPRS scale: 0/10  ?Pain location: knee ?Pain orientation: Right and Anterior  ?PAIN TYPE: tight ?Pain description: intermittent  ?Aggravating factors: letting knee stay in one position, when first gets up after sitting ?Relieving factors: moving knee ? ?OBJECTIVE:  ?  ?DIAGNOSTIC FINDINGS: MRI: 1.5 cm AP x 1.0 cm TR osteochondral defect of the mesial central ?medial femoral condyle with the displaced fragment in the ?suprapatellar joint space. ?  ?PATIENT SURVEYS:  ?  06/21/21 FOTO 40 (predicted 66) ?07/18/21 FOTO 62 ?  ?LE AROM/PROM: ?  ?A/PROM Right ?06/21/21 Left ?06/21/21 Right ?07/02/21 Right ?07/11/21  ?Hip flexion        ?Hip extension        ?Hip abduction        ?Hip adduction        ?Hip internal rotation        ?Hip external rotation        ?Knee flexion A 101 ?P 107 A 137 A 118 A:124  ?Knee extension A -11 (LAQ) ?P 0  A 5 A -2  ?(LAQ) 0  ?Ankle dorsiflexion        ?Ankle plantarflexion        ?Ankle inversion        ?Ankle eversion        ?(Blank rows = not tested) ?  ?LE MMT: ?     2/23/23Deferred due to post op status ?MMT Right ?07/11/21 Left ?07/11/21   ?Hip flexion  5 5    ?Hip extension       ?Hip abduction 5  5    ?Hip adduction 5  5    ?Hip internal rotation       ?Hip external rotation       ?Knee flexion  5 5    ?Knee extension 5-   5   ?Ankle dorsiflexion       ?Ankle plantarflexion       ?Ankle inversion       ?Ankle eversion       ?(Blank rows = not tested) ? ?GAIT: ?07/11/21: gait now WNL ?07/04/21: ? Amb without AD independently; mild antalgic gait first few steps normalizing with increased distance ?  ?EDEMA: 06/21/2021  swelling noted in Rt knee ?  ?  ?TODAY'S TREATMENT: ?07/25/21 ?Therapeutic Exercise: ?Aerobic: Bike L6 x 8 min ? ?Machines: ? Leg Press 100# 3x10 ? Knee Extension 15# 3x10 (min/mod A from LLE) ? Hamstring curl machine DL 35# 3X10 ?  ?Standing: ? Decline squats 10# 2X10 ? Walking lunges 20' x 2; 10# DB in goblet hold ? Lateral squat x10 bil; with calf raise between sides ?Modalities: Vaso Rt knee x 10 min; mod pressure, 34 deg   ? ?07/23/21 ?Therapeutic Exercise: ?Aerobic: Nu step L7 X 6:30 min LE only ? ?Machines: ? Knee Extension 10# 3x10 (min/mod A from LLE) ? Hamstring curl machine DL 35# 3X10 ?  ?Standing: ? Gastroc stretch 30 sec X 2 on slantboard ? Quad stretch on Rt 2X30 sec ? Squats 2x10 on ball on wall with 5 sec hold ? Decline squats 10# 2X10 ? Lateral lunges x10 bil ?  ? ?Modalities: Vaso Rt knee x 10 min; mod pressure, 34 deg ? ?07/18/21 ?Therapeutic Exercise: ?Aerobic: Recumbent Bike Seat 7 L5 x 8 min ? ?Machines: ? Leg Press 100# 3x10 ? Knee Extension 10# 3x10 (min/mod A from LLE) ?  ?Standing: ? Squats 2x10 with TRX ? Lateral lunges with TRX 2x10 bil ? Curtsy lunges with TRX x10 ? ?Modalities: Vaso Rt knee x 10 min; mod pressure, 34 deg ? ?07/16/21 ?Therapeutic Exercise: ?Aerobic: NuStep L8  for 8 min, LEs only ? ?Machines: ? Leg Press 75# 3x10 ? Knee Extension 10# 3x10 (min/mod A from LLE) ?  ?Standing: ? Squats 3x10 on ramp (heels elevated) ?Calf raises (focus on RLE) 3x10, off incline board ?Alternating lunges, partial range x 10 reps bil ? RLE RDL with 10# KB 2x10 ? ?Modalities: Vaso Rt  knee x 10 min; mod pressure, 34 deg ? ? ?PATIENT EDUCATION:  ?Education details: HEP ?Person educated: Patient ?Education method: Explanation, Demonstration, and Handouts ?Education comprehension: verbalized understanding, returned demonstration, and needs further education ?  ?  ?HOME EXERCISE PROGRAM: ?Access Code: [email or web address] ?URL: [email or web address] ?Date: 06/21/2021 ?Prepared by: Faustino Congress ?  ?Program Notes ?[email or web address] : [facility] Stretching Strap Yoga Strap Physical Therapy for Home Workout, Exercise, Pilates and Gymnastics, 10 Loops Non-Elastic Stretch Bands with Workout Guide for Women & Men. : Sports & Outdoors ?  ?  ?  ?Exercises ?Long Sitting Quad Set - 5-10 x daily - 7 x weekly - 1 sets - 5-10 reps - 5 sec hold ?Supine Heel Slide with Strap - 5-10 x daily - 7 x weekly - 1 sets - 5-10 reps ?Seated Knee Extension AROM - 5-10 x daily - 7 x weekly - 1 sets - 5-10 reps - 2-3 sec hold ?Seated Knee Flexion AAROM - 5-10 x daily - 7 x weekly - 1 sets - 10 reps - 10 sec hold ?  ?  ?ASSESSMENT: ?  ?CLINICAL IMPRESSION: ?Pt tolerated session well today and continues to progress strengthening exercises.  Will decr freq to 1x/wk and transition more to home program.   ? ?OBJECTIVE IMPAIRMENTS Abnormal gait, decreased activity tolerance, decreased balance, decreased endurance, decreased knowledge of use of DME, decreased mobility, difficulty walking, decreased ROM, decreased strength, increased edema, and pain.  ?  ?ACTIVITY LIMITATIONS cleaning, community activity, driving, occupation, laundry, and shopping.  ?  ?PERSONAL FACTORS 3+ comorbidities: DM, HTN, obesity  are also affecting patient's  functional outcome.  ?  ?REHAB POTENTIAL: Excellent ?  ?CLINICAL DECISION MAKING: Evolving/moderate complexity ?  ?EVALUATION COMPLEXITY: Moderate ?  ?GOALS: ?Goals reviewed with patient? Yes ?  ?SHORT TERM GOAL

## 2021-07-31 ENCOUNTER — Ambulatory Visit: Payer: Federal, State, Local not specified - PPO | Admitting: Physical Therapy

## 2021-07-31 ENCOUNTER — Encounter: Payer: Self-pay | Admitting: Physical Therapy

## 2021-07-31 ENCOUNTER — Other Ambulatory Visit: Payer: Self-pay

## 2021-07-31 DIAGNOSIS — M25661 Stiffness of right knee, not elsewhere classified: Secondary | ICD-10-CM | POA: Diagnosis not present

## 2021-07-31 DIAGNOSIS — R2689 Other abnormalities of gait and mobility: Secondary | ICD-10-CM | POA: Diagnosis not present

## 2021-07-31 DIAGNOSIS — M25561 Pain in right knee: Secondary | ICD-10-CM

## 2021-07-31 DIAGNOSIS — R600 Localized edema: Secondary | ICD-10-CM

## 2021-07-31 DIAGNOSIS — M6281 Muscle weakness (generalized): Secondary | ICD-10-CM | POA: Diagnosis not present

## 2021-07-31 NOTE — Therapy (Signed)
?OUTPATIENT PHYSICAL THERAPY TREATMENT NOTE ? ? ?Patient Name: Michele [name] ?MRN: [id number] ?DOB:[birth year], 46 y.o., female ?Today's Date: 07/31/2021 ? ?PCP: Caren Macadam, MD ?REFERRING PROVIDER: Meredith Pel, MD ? ? PT End of Session - 07/31/21 1519   ? ? Visit Number 12   ? Number of Visits 16   ? Date for PT Re-Evaluation 08/16/21   ? PT Start Time 8841   ? PT Stop Time 6606   ? PT Time Calculation (min) 46 min   ? Activity Tolerance Patient tolerated treatment well   ? Behavior During Therapy [facility] for tasks assessed/performed   ? ?  ?  ? ?  ? ? ? ? ? ? ? ? ? ? ?Past Medical History:  ?Diagnosis Date  ? Diabetes mellitus without complication ([place])   ? Hypertension   ? under control with med., has been on med. x 2 yr.  ? Hypertrophy of breast 03/2013  ? Obesity   ? ?Past Surgical History:  ?Procedure Laterality Date  ? ABDOMINAL HYSTERECTOMY N/A 04/13/2020  ? Procedure: HYSTERECTOMY ABDOMINAL;  Surgeon: Delsa Bern, MD;  Location: [place];  Service: Gynecology;  Laterality: N/A;  AUTO INFUSION PROTOCOL  ? BILATERAL SALPINGECTOMY Bilateral 04/13/2020  ? Procedure: OPEN BILATERAL SALPINGECTOMY;  Surgeon: Delsa Bern, MD;  Location: [place];  Service: Gynecology;  Laterality: Bilateral;  ? BREAST REDUCTION SURGERY Bilateral 04/26/2013  ? Procedure: BILATERAL BREAST REDUCTION WITH LIPOSUCTION;  Surgeon: Cristine Polio, MD;  Location: [place];  Service: Plastics;  Laterality: Bilateral;  ? CHOLECYSTECTOMY N/A 05/10/2016  ? Procedure: LAPAROSCOPIC CHOLECYSTECTOMY WITH INTRAOPERATIVE CHOLANGIOGRAM;  Surgeon: Donnie Mesa, MD;  Location: [place];  Service: General;  Laterality: N/A;  ? FOOT SURGERY  2003; 2004  ? each foot - bunion, hammer toe  ? OSTEOCHONDRAL DEFECT REPAIR/RECONSTRUCTION Right 05/14/2021  ? Procedure: right knee osteochondral open allograft fro medial femoral defect;  Surgeon: Meredith Pel, MD;  Location: [place];  Service: Orthopedics;   Laterality: Right;  ? ?Patient Active Problem List  ? Diagnosis Date Noted  ? Osteochondritis dissecans of right knee   ? Multinodular goiter 12/31/2020  ? Fibroids 04/03/2020  ? Patellofemoral syndrome of right knee 11/24/2019  ? Nonallopathic lesion of lumbar region 09/09/2019  ? Nonallopathic lesion of sacral region 09/09/2019  ? Nonallopathic lesion of thoracic region 09/09/2019  ? Nonallopathic lesion of rib cage 09/09/2019  ? Nonallopathic lesion of cervical region 09/09/2019  ? Acute bursitis of left shoulder 09/09/2019  ? Cholecystitis, acute with cholelithiasis 05/09/2016  ? Gynecomastia, female 02/03/2013  ? Back pain, acute 06/22/2012  ? Obesity 05/25/2010  ? Essential hypertension 05/25/2010  ? ? ?REFERRING DIAG: M95.8 (ICD-10-CM) - Osteochondral defect of femoral condyle  ? ?THERAPY DIAG:  ?Acute pain of right knee ? ?Stiffness of right knee, not elsewhere classified ? ?Other abnormalities of gait and mobility ? ?Muscle weakness (generalized) ? ?Localized edema ? ?PERTINENT HISTORY: DM, HTN, obesity ? ?PRECAUTIONS: Fall ? ?SUBJECTIVE:  She does not have access to gym. She has been doing her HEP.   ? ?PAIN:  ?Are you having pain? No ?NPRS scale:  0/10  ?Pain location: knee ?Pain orientation: Right and Anterior  ?PAIN TYPE: tight ?Pain description: intermittent  ?Aggravating factors: letting knee stay in one position, when first gets up after sitting ?Relieving factors: moving knee ? ?OBJECTIVE:  ?  ?DIAGNOSTIC FINDINGS: MRI: 1.5 cm AP x 1.0 cm TR osteochondral defect of the mesial central ?medial femoral condyle  with the displaced fragment in the ?suprapatellar joint space. ?  ?PATIENT SURVEYS:  ?06/21/21 FOTO 40 (predicted 66) ?07/18/21 FOTO 62 ?  ?LE AROM/PROM: ?  ?A/PROM Right ?06/21/21 Left ?06/21/21 Right ?07/02/21 Right ?07/11/21  ?Hip flexion        ?Hip extension        ?Hip abduction        ?Hip adduction        ?Hip internal rotation        ?Hip external rotation        ?Knee flexion A 101 ?P 107 A  137 A 118 A:124  ?Knee extension A -11 (LAQ) ?P 0 A 5 A -2  ?(LAQ) 0  ?Ankle dorsiflexion        ?Ankle plantarflexion        ?Ankle inversion        ?Ankle eversion        ?(Blank rows = not tested) ?  ?LE MMT: ?     2/23/23Deferred due to post op status ?MMT Right ?07/11/21 Left ?07/11/21   ?Hip flexion  5 5    ?Hip extension       ?Hip abduction 5  5    ?Hip adduction 5  5    ?Hip internal rotation       ?Hip external rotation       ?Knee flexion  5 5    ?Knee extension 5-   5   ?Ankle dorsiflexion       ?Ankle plantarflexion       ?Ankle inversion       ?Ankle eversion       ?(Blank rows = not tested) ? ?GAIT: ?07/11/21: gait now WNL ?07/04/21: ? Amb without AD independently; mild antalgic gait first few steps normalizing with increased distance ?  ?EDEMA: 06/21/2021  swelling noted in Rt knee ?  ?  ?TODAY'S TREATMENT: ?07/31/2021 ?Leg press BLEs 100# 25 reps ? ? ? 07/31/21 1602  ?PT Education  ?Education Details Access Code: [email or web address]  ?Person(s) Educated Patient  ?Methods Explanation;Demonstration;Tactile cues;Verbal cues;Handout  ?Comprehension Verbalized understanding;Returned demonstration;Verbal cues required;Tactile cues required;Need further instruction  ? ?Pt performed 10 reps of added exercises for comprehension.  ? ?Access Code: [email or web address] ?URL: [email or web address] ?Date: 07/31/2021 ?Prepared by: Jamey Reas ? ?Exercises ?Reviewed using handout  (she has 2-3# cuff wt at home) ?- Long Sitting Quad Set add towel roll under ankle - 5-10 x daily - 7 x weekly - 1 sets - 5-10 reps - 5 sec hold ?- Supine Heel Slide with Strap can add ball under calf - 5-10 x daily - 7 x weekly - 1 sets - 5-10 reps ?- Seated Knee Extension AROM add cuff wt - 5-10 x daily - 7 x weekly - 1 sets - 5-10 reps - 2-3 sec hold ?- Seated Knee Flexion AAROM  - 5-10 x daily - 7 x weekly - 1 sets - 10 reps - 10 sec hold ?- Tandem Stance in Corner  - 1 x daily - 7 x weekly - 1 sets - 2 reps - 30 seconds hold ?- Standing Heel Raise   - 2-3 x daily - 7 x weekly - 1 sets - 15 reps - 3 seconds hold ?- Single Leg Stance  - 2-3 x daily - 7 x weekly - 1 sets - 5 reps ?- Standing Knee Flexion AROM with Chair Support add cuff wt - 2-3 x daily - 7 x weekly - 1 sets - 15 reps -  5 seconds hold ? ?added ?- Standing 3-Way Kick  - 1 x daily - 7 x weekly - 1 sets - 10 reps  (reviewed stance on RLE) ?- Standard Lunge  - 1 x daily - 7 x weekly - 1 sets - 10 reps - 5 seconds hold (between 2 chairs for deeper lunge alt. Trailing LE) ?- Lateral Lunge  - 1 x daily - 7 x weekly - 1 sets - 10 reps - 5 seconds hold (chair in front both from right & left side alternating which leg is brought in to other LE) ?- Forward Lunge with Rotation  - 1 x daily - 7 x weekly - 1 sets - 5-10 reps - 5 seconds hold (between 2 chairs rotating towards trailing LE alternating lead LE) ?- Curtsy Lunge  - 1 x daily - 7 x weekly - 1 sets - 10 reps - 5 seconds hold (sink in front both right & left curtsy) ?- 3-Way Lunge on Slider  - 1 x daily - 7 x weekly - 1 sets - 5-10 reps - 5 seconds hold  (sink to side for safety if go too far) ?- Squat with Chair Touch  - 1 x daily - 7 x weekly - 1 sets - 10 reps - 5 seconds hold (chair behind enables deeper squat) ?- Squat to Heel Raise  - 1 x daily - 7 x weekly - 1 sets - 10 reps - 5 seconds hold  (chair behind & sink in front) ?- Standing Squat to Heel Raise with Resistance  - 1 x daily - 7 x weekly - 1 sets - 10 reps - 5 seconds hold  (blue theraband) ? ?07/25/21 ?Therapeutic Exercise: ?Aerobic: Bike L6 x 8 min ? ?Machines: ? Leg Press 100# 3x10 ? Knee Extension 15# 3x10 (min/mod A from LLE) ? Hamstring curl machine DL 35# 3X10 ?  ?Standing: ? Decline squats 10# 2X10 ? Walking lunges 20' x 2; 10# DB in goblet hold ? Lateral squat x10 bil; with calf raise between sides ?Modalities: Vaso Rt knee x 10 min; mod pressure, 34 deg   ? ?07/23/21 ?Therapeutic Exercise: ?Aerobic: Nu step L7 X 6:30 min LE only ? ?Machines: ? Knee Extension 10# 3x10 (min/mod A  from LLE) ? Hamstring curl machine DL 35# 3X10 ?  ?Standing: ? Gastroc stretch 30 sec X 2 on slantboard ? Quad stretch on Rt 2X30 sec ? Squats 2x10 on ball on wall with 5 sec hold ? Decline squats 10# 2X10 ?

## 2021-08-09 ENCOUNTER — Encounter: Payer: Federal, State, Local not specified - PPO | Admitting: Physical Therapy

## 2021-08-10 ENCOUNTER — Encounter: Payer: Self-pay | Admitting: Physical Therapy

## 2021-08-10 ENCOUNTER — Ambulatory Visit: Payer: Federal, State, Local not specified - PPO | Admitting: Physical Therapy

## 2021-08-10 ENCOUNTER — Ambulatory Visit (INDEPENDENT_AMBULATORY_CARE_PROVIDER_SITE_OTHER): Payer: Federal, State, Local not specified - PPO

## 2021-08-10 ENCOUNTER — Ambulatory Visit (INDEPENDENT_AMBULATORY_CARE_PROVIDER_SITE_OTHER): Payer: Federal, State, Local not specified - PPO | Admitting: Orthopedic Surgery

## 2021-08-10 DIAGNOSIS — M25562 Pain in left knee: Secondary | ICD-10-CM

## 2021-08-10 DIAGNOSIS — R600 Localized edema: Secondary | ICD-10-CM

## 2021-08-10 DIAGNOSIS — M6281 Muscle weakness (generalized): Secondary | ICD-10-CM

## 2021-08-10 DIAGNOSIS — M25661 Stiffness of right knee, not elsewhere classified: Secondary | ICD-10-CM | POA: Diagnosis not present

## 2021-08-10 DIAGNOSIS — M25561 Pain in right knee: Secondary | ICD-10-CM

## 2021-08-10 DIAGNOSIS — M25462 Effusion, left knee: Secondary | ICD-10-CM | POA: Diagnosis not present

## 2021-08-10 DIAGNOSIS — R2689 Other abnormalities of gait and mobility: Secondary | ICD-10-CM | POA: Diagnosis not present

## 2021-08-10 NOTE — Therapy (Signed)
?OUTPATIENT PHYSICAL THERAPY TREATMENT NOTE ? ? ?Patient Name: Michele [name] ?MRN: [id number] ?DOB:Oct 02, 1975, 46 y.o., female ?Today's Date: 08/10/2021 ? ?PCP: Caren Macadam, MD ?REFERRING PROVIDER: Meredith Pel, MD ? ? PT End of Session - 08/10/21 0846   ? ? Visit Number 13   ? Number of Visits 16   ? Date for PT Re-Evaluation 08/16/21   ? PT Start Time 0845   ? PT Stop Time 0930   ? PT Time Calculation (min) 45 min   ? Activity Tolerance Patient tolerated treatment well   ? Behavior During Therapy [facility] for tasks assessed/performed   ? ?  ?  ? ?  ? ? ? ? ? ? ? ? ? ? ? ?Past Medical History:  ?Diagnosis Date  ? Diabetes mellitus without complication ([place])   ? Hypertension   ? under control with med., has been on med. x 2 yr.  ? Hypertrophy of breast 03/2013  ? Obesity   ? ?Past Surgical History:  ?Procedure Laterality Date  ? ABDOMINAL HYSTERECTOMY N/A 04/13/2020  ? Procedure: HYSTERECTOMY ABDOMINAL;  Surgeon: Delsa Bern, MD;  Location: [place];  Service: Gynecology;  Laterality: N/A;  AUTO INFUSION PROTOCOL  ? BILATERAL SALPINGECTOMY Bilateral 04/13/2020  ? Procedure: OPEN BILATERAL SALPINGECTOMY;  Surgeon: Delsa Bern, MD;  Location: [place];  Service: Gynecology;  Laterality: Bilateral;  ? BREAST REDUCTION SURGERY Bilateral 04/26/2013  ? Procedure: BILATERAL BREAST REDUCTION WITH LIPOSUCTION;  Surgeon: Cristine Polio, MD;  Location: [place];  Service: Plastics;  Laterality: Bilateral;  ? CHOLECYSTECTOMY N/A 05/10/2016  ? Procedure: LAPAROSCOPIC CHOLECYSTECTOMY WITH INTRAOPERATIVE CHOLANGIOGRAM;  Surgeon: Donnie Mesa, MD;  Location: [place];  Service: General;  Laterality: N/A;  ? FOOT SURGERY  2003; 2004  ? each foot - bunion, hammer toe  ? OSTEOCHONDRAL DEFECT REPAIR/RECONSTRUCTION Right 05/14/2021  ? Procedure: right knee osteochondral open allograft fro medial femoral defect;  Surgeon: Meredith Pel, MD;  Location: [place];  Service: Orthopedics;   Laterality: Right;  ? ?Patient Active Problem List  ? Diagnosis Date Noted  ? Osteochondritis dissecans of right knee   ? Multinodular goiter 12/31/2020  ? Fibroids 04/03/2020  ? Patellofemoral syndrome of right knee 11/24/2019  ? Nonallopathic lesion of lumbar region 09/09/2019  ? Nonallopathic lesion of sacral region 09/09/2019  ? Nonallopathic lesion of thoracic region 09/09/2019  ? Nonallopathic lesion of rib cage 09/09/2019  ? Nonallopathic lesion of cervical region 09/09/2019  ? Acute bursitis of left shoulder 09/09/2019  ? Cholecystitis, acute with cholelithiasis 05/09/2016  ? Gynecomastia, female 02/03/2013  ? Back pain, acute 06/22/2012  ? Obesity 05/25/2010  ? Essential hypertension 05/25/2010  ? ? ?REFERRING DIAG: M95.8 (ICD-10-CM) - Osteochondral defect of femoral condyle  ? ?THERAPY DIAG:  ?Acute pain of right knee ? ?Stiffness of right knee, not elsewhere classified ? ?Other abnormalities of gait and mobility ? ?Muscle weakness (generalized) ? ?Localized edema ? ?PERTINENT HISTORY: DM, HTN, obesity ? ?PRECAUTIONS: Fall ? ?SUBJECTIVE: relays her knee feels fine in terms of pain, still with some tightness. She will see MD today after PT.  ? ?PAIN:  ?Are you having pain? No ?NPRS scale:  0/10  ?Pain location: knee ?Pain orientation: Right and Anterior  ?PAIN TYPE: tight ?Pain description: intermittent  ?Aggravating factors: letting knee stay in one position, when first gets up after sitting ?Relieving factors: moving knee ? ?OBJECTIVE:  ? ?  ?PATIENT SURVEYS:  ?06/21/21 FOTO 40 (predicted 66) ?07/18/21 FOTO 62 ?  ?  LE AROM/PROM: ?  ?A/PROM Right ?06/21/21 Left ?06/21/21 Right ?07/02/21 Right ?07/11/21 Right ?08/10/21  ?Hip flexion         ?Hip extension         ?Hip abduction         ?Hip adduction         ?Hip internal rotation         ?Hip external rotation         ?Knee flexion A 101 ?P 107 A 137 A 118 A:124 A:125  ?Knee extension A -11 (LAQ) ?P 0 A 5 A -2  ?(LAQ) 0 0  ?Ankle dorsiflexion         ?Ankle  plantarflexion         ?Ankle inversion         ?Ankle eversion         ?(Blank rows = not tested) ?  ?LE MMT: ?     2/23/23Deferred due to post op status ?MMT Right ?07/11/21 Left ?07/11/21 Right ?08/10/21  ?Hip flexion  '5 5  5  '$ ?Hip extension       ?Hip abduction '5  5  5  '$ ?Hip adduction '5  5  5  '$ ?Hip internal rotation       ?Hip external rotation       ?Knee flexion  '5 5  5  '$ ?Knee extension 5-   5 5  ?Ankle dorsiflexion       ?Ankle plantarflexion       ?Ankle inversion       ?Ankle eversion       ?(Blank rows = not tested) ? ?GAIT: ?07/11/21: gait now WNL ?07/04/21: ? Amb without AD independently; mild antalgic gait first few steps normalizing with increased distance ?  ?EDEMA: 06/21/2021  swelling noted in Rt knee ?  ?  ?TODAY'S TREATMENT: ?08/10/21 ?Bike L6 X 8 min ?Leg press DL 100# 3X10, SL 50# X 15 ea ?Knee Extension machine 15# 3x10 (min/mod A from LLE) ?Hamstring curl machine DL 35# 3X10 ?Standing quad stretch with one UE support 30 sec X 2 bilat ?Squat to chair touch X 10 reps ?Lateral lunge X 10 reps bilat ?Fwd lunge X 10 bilat ?Step ups with alternate leg march on 6.5 inch curb X 10 bilat ? ? ?07/31/2021 ?Leg press BLEs 100# 25 reps ? ? ? 07/31/21 1602  ?PT Education  ?Education Details Access Code: [email or web address]  ?Person(s) Educated Patient  ?Methods Explanation;Demonstration;Tactile cues;Verbal cues;Handout  ?Comprehension Verbalized understanding;Returned demonstration;Verbal cues required;Tactile cues required;Need further instruction  ? ?Pt performed 10 reps of added exercises for comprehension.  ? ?Access Code: [email or web address] ?URL: [email or web address] ?Date: 07/31/2021 ?Prepared by: Jamey Reas ? ?Exercises ?Reviewed using handout  (she has 2-3# cuff wt at home) ?- Long Sitting Quad Set add towel roll under ankle - 5-10 x daily - 7 x weekly - 1 sets - 5-10 reps - 5 sec hold ?- Supine Heel Slide with Strap can add ball under calf - 5-10 x daily - 7 x weekly - 1 sets - 5-10 reps ?- Seated Knee  Extension AROM add cuff wt - 5-10 x daily - 7 x weekly - 1 sets - 5-10 reps - 2-3 sec hold ?- Seated Knee Flexion AAROM  - 5-10 x daily - 7 x weekly - 1 sets - 10 reps - 10 sec hold ?- Tandem Stance in Corner  - 1 x daily - 7 x weekly - 1 sets - 2 reps - 30 seconds hold ?-  Standing Heel Raise  - 2-3 x daily - 7 x weekly - 1 sets - 15 reps - 3 seconds hold ?- Single Leg Stance  - 2-3 x daily - 7 x weekly - 1 sets - 5 reps ?- Standing Knee Flexion AROM with Chair Support add cuff wt - 2-3 x daily - 7 x weekly - 1 sets - 15 reps - 5 seconds hold ? ?added ?- Standing 3-Way Kick  - 1 x daily - 7 x weekly - 1 sets - 10 reps  (reviewed stance on RLE) ?- Standard Lunge  - 1 x daily - 7 x weekly - 1 sets - 10 reps - 5 seconds hold (between 2 chairs for deeper lunge alt. Trailing LE) ?- Lateral Lunge  - 1 x daily - 7 x weekly - 1 sets - 10 reps - 5 seconds hold (chair in front both from right & left side alternating which leg is brought in to other LE) ?- Forward Lunge with Rotation  - 1 x daily - 7 x weekly - 1 sets - 5-10 reps - 5 seconds hold (between 2 chairs rotating towards trailing LE alternating lead LE) ?- Curtsy Lunge  - 1 x daily - 7 x weekly - 1 sets - 10 reps - 5 seconds hold (sink in front both right & left curtsy) ?- 3-Way Lunge on Slider  - 1 x daily - 7 x weekly - 1 sets - 5-10 reps - 5 seconds hold  (sink to side for safety if go too far) ?- Squat with Chair Touch  - 1 x daily - 7 x weekly - 1 sets - 10 reps - 5 seconds hold (chair behind enables deeper squat) ?- Squat to Heel Raise  - 1 x daily - 7 x weekly - 1 sets - 10 reps - 5 seconds hold  (chair behind & sink in front) ?- Standing Squat to Heel Raise with Resistance  - 1 x daily - 7 x weekly - 1 sets - 10 reps - 5 seconds hold  (blue theraband) ? ?07/25/21 ?Therapeutic Exercise: ?Aerobic: Bike L6 x 8 min ? ?Machines: ? Leg Press 100# 3x10 ? Knee Extension 15# 3x10 (min/mod A from LLE) ? Hamstring curl machine DL 35# 3X10 ?  ?Standing: ? Decline squats  10# 2X10 ? Walking lunges 20' x 2; 10# DB in goblet hold ? Lateral squat x10 bil; with calf raise between sides ?Modalities: Vaso Rt knee x 10 min; mod pressure, 34 deg   ? ?07/23/21 ?Therapeutic Exercise: ?Aerobic: Nu

## 2021-08-11 ENCOUNTER — Encounter: Payer: Self-pay | Admitting: Orthopedic Surgery

## 2021-08-11 MED ORDER — METHYLPREDNISOLONE ACETATE 40 MG/ML IJ SUSP
40.0000 mg | INTRAMUSCULAR | Status: AC | PRN
  Administered 2021-08-10: 40 mg via INTRA_ARTICULAR

## 2021-08-11 MED ORDER — BUPIVACAINE HCL 0.25 % IJ SOLN
4.0000 mL | INTRAMUSCULAR | Status: AC | PRN
  Administered 2021-08-10: 4 mL via INTRA_ARTICULAR

## 2021-08-11 MED ORDER — LIDOCAINE HCL 1 % IJ SOLN
5.0000 mL | INTRAMUSCULAR | Status: AC | PRN
  Administered 2021-08-10: 5 mL

## 2021-08-11 NOTE — Progress Notes (Signed)
? ?Office Visit Note ?  ?Patient: Michele [name]           ?Date of Birth: 29-Mar-1976           ?MRN: [id number] ?Visit Date: 08/10/2021 ?Requested by: Caren Macadam, MD ?[address] ?[place],  [place] [address] ?PCP: Caren Macadam, MD ? ?Subjective: ?Chief Complaint  ?Patient presents with  ? Right Knee - Routine Post Op  ?    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 ?  ? ? 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  She is doing well overall from that.  In physical therapy.  Does report some occasional stiffness in the nonoperative leg.  No mechanical symptoms on the right.  She does report some left knee swelling and pain however.  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  Doing squats and lunges and physical therapy.  Symptoms on the left-hand side of been bothering her for 2 weeks.  Stairs make it worse. ?             ?ROS: All systems reviewed are negative as they relate to the chief complaint within the history of present illness.  Patient denies  fevers or chills. ? ? ?Assessment & Plan: ?Visit Diagnoses:  ?1. Left knee pain, unspecified chronicity   ? ? 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  She does have fairly significant left knee effusion and radiographs do not show an obvious arthritic source of that.  With her history of large osteochondral defect on the right-hand side and no mechanical symptoms and swelling on the left-hand side my concern is that she may have similar problem on the left that she had on the right.  Aspiration and injection of that left knee is performed today.  MRI scan of that left knee indicated based on presence of significant 40 cc effusion with possibility of osteochondritis dissecans lesion on the left-hand side.  Plan to see her back after that study. ? ?Follow-Up Instructions: No follow-ups  on file.  ? ?Orders:  ?Orders Placed This Encounter  ?Procedures  ? XR KNEE 3 VIEW LEFT  ? MR Knee Left w/o contrast  ? ?No orders of the defined types were placed in this encounter. ? ? ? ? Procedures: ?Large Joint Inj: L knee on 08/10/2021 8:37 AM ?Indications: diagnostic evaluation, joint swelling and pain ?Details: 18 G 1.5 in needle, superolateral approach ? ?Arthrogram: No ? ?Medications: 5 mL lidocaine 1 %; 40 mg methylPREDNISolone acetate 40 MG/ML; 4 mL bupivacaine 0.25 % ?Outcome: tolerated well, no immediate complications ?Procedure, treatment alternatives, risks and benefits explained, specific risks discussed. Consent was given by the patient. Immediately prior to procedure a time out was called to verify the correct patient, procedure, equipment, support staff and site/side marked as required. Patient was prepped and draped in the usual sterile fashion.  ? ? ? ? ?Clinical Data: ?No additional findings. ? ?Objective: ?Vital Signs: There were no vitals taken for this visit. ? ?Physical Exam:  ? ?Constitutional: Patient appears well-developed ?HEENT:  ?Head: Normocephalic ?Eyes:EOM are normal ?Neck: Normal range of motion ?Cardiovascular: Normal rate ?Pulmonary/chest: Effort normal ?Neurologic: Patient is alert ?Skin: Skin is warm ?Psychiatric: Patient has normal mood and affect ? ? ?Ortho Exam: Ortho exam demonstrates full active and passive range of motion on the left.  On the right her  range of motion is 0-1 20 with no effusion.  On the left she has a moderate effusion with no joint line tenderness.  Collateral and cruciate ligaments are stable.  McMurray compression testing equivocal for medial and lateral compartment pathology.  Pedal pulses palpable.  No other masses lymphadenopathy or skin changes noted in that left knee region. ? ?Specialty Comments:  ?No specialty comments available. ? ?Imaging: ?No results found. ? ? ?PMFS History: ?Patient Active Problem List  ? Diagnosis Date Noted  ?  Osteochondritis dissecans of right knee   ? Multinodular goiter 12/31/2020  ? Fibroids 04/03/2020  ? Patellofemoral syndrome of right knee 11/24/2019  ? Nonallopathic lesion of lumbar region 09/09/2019  ? Nonallopathic lesion of sacral region 09/09/2019  ? Nonallopathic lesion of thoracic region 09/09/2019  ? Nonallopathic lesion of rib cage 09/09/2019  ? Nonallopathic lesion of cervical region 09/09/2019  ? Acute bursitis of left shoulder 09/09/2019  ? Cholecystitis, acute with cholelithiasis 05/09/2016  ? Gynecomastia, female 02/03/2013  ? Back pain, acute 06/22/2012  ? Obesity 05/25/2010  ? Essential hypertension 05/25/2010  ? ?Past Medical History:  ?Diagnosis Date  ? Diabetes mellitus without complication ([place])   ? Hypertension   ? under control with med., has been on med. x 2 yr.  ? Hypertrophy of breast 03/2013  ? Obesity   ?  ?Family History  ?Problem Relation Age of Onset  ? Migraines Mother   ? Arthritis Mother   ? Deep vein thrombosis Mother   ?     started after ortho surgery; lifetime  ? Hyperlipidemia Mother   ? Cancer Father 4  ?     Prostate  ? Heart disease Father   ?     bypass  ? Heart attack Father 63  ? Hypertension Father   ? Headache Brother   ? Other Brother   ?     ?something with heart; under eval  ? Glaucoma Maternal Grandmother   ? Head & neck cancer Maternal Grandmother   ? Alzheimer's disease Maternal Grandfather   ? Cancer Paternal Grandmother   ?     blood?  ? Cancer Paternal Grandfather   ?     prostate  ? Heart attack Paternal Uncle 68  ? Thyroid disease Neg Hx   ?  ?Past Surgical History:  ?Procedure Laterality Date  ? ABDOMINAL HYSTERECTOMY N/A 04/13/2020  ? Procedure: HYSTERECTOMY ABDOMINAL;  Surgeon: Delsa Bern, MD;  Location: [place];  Service: Gynecology;  Laterality: N/A;  AUTO INFUSION PROTOCOL  ? BILATERAL SALPINGECTOMY Bilateral 04/13/2020  ? Procedure: OPEN BILATERAL SALPINGECTOMY;  Surgeon: Delsa Bern, MD;  Location: [place];  Service: Gynecology;  Laterality:  Bilateral;  ? BREAST REDUCTION SURGERY Bilateral 04/26/2013  ? Procedure: BILATERAL BREAST REDUCTION WITH LIPOSUCTION;  Surgeon: Cristine Polio, MD;  Location: [place];  Service: Plastics;  Laterality: Bilateral;  ? CHOLECYSTECTOMY N/A 05/10/2016  ? Procedure: LAPAROSCOPIC CHOLECYSTECTOMY WITH INTRAOPERATIVE CHOLANGIOGRAM;  Surgeon: Donnie Mesa, MD;  Location: [place];  Service: General;  Laterality: N/A;  ? FOOT SURGERY  2003; 2004  ? each foot - bunion, hammer toe  ? OSTEOCHONDRAL DEFECT REPAIR/RECONSTRUCTION Right 05/14/2021  ? Procedure: right knee osteochondral open allograft fro medial femoral defect;  Surgeon: Meredith Pel, MD;  Location: [place];  Service: Orthopedics;  Laterality: Right;  ? ?Social History  ? ?Occupational History  ? Not on file  ?Tobacco Use  ? Smoking status: Never  ? Smokeless tobacco: Never  ?Vaping  Use  ? Vaping Use: Never used  ?Substance and Sexual Activity  ? Alcohol use: Yes  ?  Comment: rarely  ? Drug use: No  ? Sexual activity: Yes  ?  Birth control/protection: I.U.D.  ? ? ? ? ? ?

## 2021-08-16 ENCOUNTER — Telehealth: Payer: Self-pay | Admitting: Orthopedic Surgery

## 2021-08-16 ENCOUNTER — Encounter: Payer: Self-pay | Admitting: Physical Therapy

## 2021-08-16 ENCOUNTER — Ambulatory Visit: Payer: Federal, State, Local not specified - PPO | Admitting: Physical Therapy

## 2021-08-16 DIAGNOSIS — R600 Localized edema: Secondary | ICD-10-CM

## 2021-08-16 DIAGNOSIS — M25661 Stiffness of right knee, not elsewhere classified: Secondary | ICD-10-CM | POA: Diagnosis not present

## 2021-08-16 DIAGNOSIS — R2689 Other abnormalities of gait and mobility: Secondary | ICD-10-CM | POA: Diagnosis not present

## 2021-08-16 DIAGNOSIS — M6281 Muscle weakness (generalized): Secondary | ICD-10-CM

## 2021-08-16 DIAGNOSIS — M25561 Pain in right knee: Secondary | ICD-10-CM | POA: Diagnosis not present

## 2021-08-16 NOTE — Therapy (Signed)
?OUTPATIENT PHYSICAL THERAPY TREATMENT NOTE ?DISCHARGE SUMMARY ? ? ?Patient Name: Michele [name] ?MRN: [id number] ?DOB:1975/10/14, 46 y.o., female ?Today's Date: 08/16/2021 ? ?PCP: Caren Macadam, MD ?REFERRING PROVIDER: Meredith Pel, MD ? ? PT End of Session - 08/16/21 1430   ? ? Visit Number 14   ? Number of Visits --   ? Date for PT Re-Evaluation 08/16/21   ? PT Start Time 1428   ? PT Stop Time 6010   ? PT Time Calculation (min) 36 min   ? Activity Tolerance Patient tolerated treatment well   ? Behavior During Therapy [facility] for tasks assessed/performed   ? ?  ?  ? ?  ? ? ? ? ? ? ? ? ? ? ? ? ?Past Medical History:  ?Diagnosis Date  ? Diabetes mellitus without complication ([place])   ? Hypertension   ? under control with med., has been on med. x 2 yr.  ? Hypertrophy of breast 03/2013  ? Obesity   ? ?Past Surgical History:  ?Procedure Laterality Date  ? ABDOMINAL HYSTERECTOMY N/A 04/13/2020  ? Procedure: HYSTERECTOMY ABDOMINAL;  Surgeon: Delsa Bern, MD;  Location: [place];  Service: Gynecology;  Laterality: N/A;  AUTO INFUSION PROTOCOL  ? BILATERAL SALPINGECTOMY Bilateral 04/13/2020  ? Procedure: OPEN BILATERAL SALPINGECTOMY;  Surgeon: Delsa Bern, MD;  Location: [place];  Service: Gynecology;  Laterality: Bilateral;  ? BREAST REDUCTION SURGERY Bilateral 04/26/2013  ? Procedure: BILATERAL BREAST REDUCTION WITH LIPOSUCTION;  Surgeon: Cristine Polio, MD;  Location: [place];  Service: Plastics;  Laterality: Bilateral;  ? CHOLECYSTECTOMY N/A 05/10/2016  ? Procedure: LAPAROSCOPIC CHOLECYSTECTOMY WITH INTRAOPERATIVE CHOLANGIOGRAM;  Surgeon: Donnie Mesa, MD;  Location: [place];  Service: General;  Laterality: N/A;  ? FOOT SURGERY  2003; 2004  ? each foot - bunion, hammer toe  ? OSTEOCHONDRAL DEFECT REPAIR/RECONSTRUCTION Right 05/14/2021  ? Procedure: right knee osteochondral open allograft fro medial femoral defect;  Surgeon: Meredith Pel, MD;  Location: [place];   Service: Orthopedics;  Laterality: Right;  ? ?Patient Active Problem List  ? Diagnosis Date Noted  ? Osteochondritis dissecans of right knee   ? Multinodular goiter 12/31/2020  ? Fibroids 04/03/2020  ? Patellofemoral syndrome of right knee 11/24/2019  ? Nonallopathic lesion of lumbar region 09/09/2019  ? Nonallopathic lesion of sacral region 09/09/2019  ? Nonallopathic lesion of thoracic region 09/09/2019  ? Nonallopathic lesion of rib cage 09/09/2019  ? Nonallopathic lesion of cervical region 09/09/2019  ? Acute bursitis of left shoulder 09/09/2019  ? Cholecystitis, acute with cholelithiasis 05/09/2016  ? Gynecomastia, female 02/03/2013  ? Back pain, acute 06/22/2012  ? Obesity 05/25/2010  ? Essential hypertension 05/25/2010  ? ? ?REFERRING DIAG: M95.8 (ICD-10-CM) - Osteochondral defect of femoral condyle  ? ?THERAPY DIAG:  ?Acute pain of right knee ? ?Stiffness of right knee, not elsewhere classified ? ?Other abnormalities of gait and mobility ? ?Muscle weakness (generalized) ? ?Localized edema ? ?PERTINENT HISTORY: DM, HTN, obesity ? ?PRECAUTIONS: Fall ? ?SUBJECTIVE: Rt knee is doing well, Lt knee feels better than last week; has MRI on Sunday ? ?PAIN:  ?Are you having pain? No ?NPRS scale:  0/10  ?Pain location: knee ?Pain orientation: Right and Anterior  ?PAIN TYPE: tight ?Pain description: intermittent  ?Aggravating factors: letting knee stay in one position, when first gets up after sitting ?Relieving factors: moving knee ? ?OBJECTIVE:  ? ?  ?PATIENT SURVEYS:  ?06/21/21 FOTO 40 (predicted 66) ?07/18/21 FOTO 62 ?08/16/21 FOTO 77 ?  ?  LE AROM/PROM: ?  ?A/PROM Right ?06/21/21 Left ?06/21/21 Right ?07/02/21 Right ?07/11/21 Right ?08/10/21 Right ?08/16/21  ?Hip flexion          ?Hip extension          ?Hip abduction          ?Hip adduction          ?Hip internal rotation          ?Hip external rotation          ?Knee flexion A 101 ?P 107 A 137 A 118 A:124 A:125 A 139  ?Knee extension A -11 (LAQ) ?P 0 A 5 A -2  ?(LAQ) 0 0 3  (hyperextension)  ?Ankle dorsiflexion          ?Ankle plantarflexion          ?Ankle inversion          ?Ankle eversion          ?(Blank rows = not tested) ?  ?LE MMT: ?     2/23/23Deferred due to post op status ?MMT Right ?07/11/21 Left ?07/11/21 Right ?08/10/21  ?Hip flexion  '5 5  5  '$ ?Hip extension       ?Hip abduction '5  5  5  '$ ?Hip adduction '5  5  5  '$ ?Hip internal rotation       ?Hip external rotation       ?Knee flexion  '5 5  5  '$ ?Knee extension 5-   5 5  ?Ankle dorsiflexion       ?Ankle plantarflexion       ?Ankle inversion       ?Ankle eversion       ?(Blank rows = not tested) ? ?GAIT: ?07/11/21: gait now WNL ?07/04/21: ? Amb without AD independently; mild antalgic gait first few steps normalizing with increased distance ?  ?EDEMA: 06/21/2021  swelling noted in Rt knee ?  ?  ?TODAY'S TREATMENT: ?08/16/21 ?Therex: ?L6 x 8 min; recumbent bike ?Rt knee AROM measurements: See above ?Dicussed HEP and community fitness options including app based exercises to perform at home ? ? ? ?08/10/21 ?Bike L6 X 8 min ?Leg press DL 100# 3X10, SL 50# X 15 ea ?Knee Extension machine 15# 3x10 (min/mod A from LLE) ?Hamstring curl machine DL 35# 3X10 ?Standing quad stretch with one UE support 30 sec X 2 bilat ?Squat to chair touch X 10 reps ?Lateral lunge X 10 reps bilat ?Fwd lunge X 10 bilat ?Step ups with alternate leg march on 6.5 inch curb X 10 bilat ? ? ?07/31/2021 ?Leg press BLEs 100# 25 reps ? ? ? 07/31/21 1602  ?PT Education  ?Education Details Access Code: [email or web address]  ?Person(s) Educated Patient  ?Methods Explanation;Demonstration;Tactile cues;Verbal cues;Handout  ?Comprehension Verbalized understanding;Returned demonstration;Verbal cues required;Tactile cues required;Need further instruction  ? ?Pt performed 10 reps of added exercises for comprehension.  ? ? ?PATIENT EDUCATION:  ?Education details: HEP ?Person educated: Patient ?Education method: Explanation, Demonstration, and Handouts ?Education comprehension: verbalized  understanding, returned demonstration, and needs further education ?  ?  ?HOME EXERCISE PROGRAM: ?Access Code: [email or web address] ?URL: [email or web address] ?Date: 07/31/2021 ?Prepared by: Jamey Reas ? ?Exercises ?Reviewed using handout  (she has 2-3# cuff wt at home) ?- Long Sitting Quad Set add towel roll under ankle - 5-10 x daily - 7 x weekly - 1 sets - 5-10 reps - 5 sec hold ?- Supine Heel Slide with Strap can add ball under calf - 5-10 x daily - 7 x weekly -  1 sets - 5-10 reps ?- Seated Knee Extension AROM add cuff wt - 5-10 x daily - 7 x weekly - 1 sets - 5-10 reps - 2-3 sec hold ?- Seated Knee Flexion AAROM  - 5-10 x daily - 7 x weekly - 1 sets - 10 reps - 10 sec hold ?- Tandem Stance in Corner  - 1 x daily - 7 x weekly - 1 sets - 2 reps - 30 seconds hold ?- Standing Heel Raise  - 2-3 x daily - 7 x weekly - 1 sets - 15 reps - 3 seconds hold ?- Single Leg Stance  - 2-3 x daily - 7 x weekly - 1 sets - 5 reps ?- Standing Knee Flexion AROM with Chair Support add cuff wt - 2-3 x daily - 7 x weekly - 1 sets - 15 reps - 5 seconds hold ? ?added ?- Standing 3-Way Kick  - 1 x daily - 7 x weekly - 1 sets - 10 reps  (reviewed stance on RLE) ?- Standard Lunge  - 1 x daily - 7 x weekly - 1 sets - 10 reps - 5 seconds hold (between 2 chairs for deeper lunge alt. Trailing LE) ?- Lateral Lunge  - 1 x daily - 7 x weekly - 1 sets - 10 reps - 5 seconds hold (chair in front both from right & left side alternating which leg is brought in to other LE) ?- Forward Lunge with Rotation  - 1 x daily - 7 x weekly - 1 sets - 5-10 reps - 5 seconds hold (between 2 chairs rotating towards trailing LE alternating lead LE) ?- Curtsy Lunge  - 1 x daily - 7 x weekly - 1 sets - 10 reps - 5 seconds hold (sink in front both right & left curtsy) ?- 3-Way Lunge on Slider  - 1 x daily - 7 x weekly - 1 sets - 5-10 reps - 5 seconds hold  (sink to side for safety if go too far) ?- Squat with Chair Touch  - 1 x daily - 7 x weekly - 1 sets - 10  reps - 5 seconds hold (chair behind enables deeper squat) ?- Squat to Heel Raise  - 1 x daily - 7 x weekly - 1 sets - 10 reps - 5 seconds hold  (chair behind & sink in front) ?- Standing Squat to Heel Raise with [place]

## 2021-08-16 NOTE — Telephone Encounter (Signed)
error 

## 2021-08-19 ENCOUNTER — Ambulatory Visit
Admission: RE | Admit: 2021-08-19 | Discharge: 2021-08-19 | Disposition: A | Discharge status: Home / Self Care | Payer: Federal, State, Local not specified - PPO | Source: Ambulatory Visit | Attending: Orthopedic Surgery | Admitting: Orthopedic Surgery

## 2021-08-19 DIAGNOSIS — M25562 Pain in left knee: Secondary | ICD-10-CM | POA: Diagnosis not present

## 2021-08-29 ENCOUNTER — Encounter: Payer: Self-pay | Admitting: Endocrinology

## 2021-08-29 ENCOUNTER — Ambulatory Visit (INDEPENDENT_AMBULATORY_CARE_PROVIDER_SITE_OTHER): Payer: Federal, State, Local not specified - PPO | Admitting: Endocrinology

## 2021-08-29 VITALS — BP 110/78 | HR 73 | Ht 65.5 in | Wt 232.6 lb

## 2021-08-29 DIAGNOSIS — E042 Nontoxic multinodular goiter: Secondary | ICD-10-CM | POA: Diagnosis not present

## 2021-08-29 LAB — T4, FREE: Free T4: 0.82 ng/dL (ref 0.60–1.60)

## 2021-08-29 LAB — TSH: TSH: 1.32 u[IU]/mL (ref 0.35–5.50)

## 2021-08-29 NOTE — Progress Notes (Signed)
? ?Subjective:  ? ? Patient ID: Michele [name], female    DOB: 1975/07/26, 46 y.o.   MRN: [id number] ? ?HPI ?Pt returns for f/u of MNG (dx'ed 2022; cytol of 2 nodules was cat 2) ?Past Medical History:  ?Diagnosis Date  ? Diabetes mellitus without complication ([place])   ? Hypertension   ? under control with med., has been on med. x 2 yr.  ? Hypertrophy of breast 03/2013  ? Obesity   ? ? ?Past Surgical History:  ?Procedure Laterality Date  ? ABDOMINAL HYSTERECTOMY N/A 04/13/2020  ? Procedure: HYSTERECTOMY ABDOMINAL;  Surgeon: Delsa Bern, MD;  Location: [place];  Service: Gynecology;  Laterality: N/A;  AUTO INFUSION PROTOCOL  ? BILATERAL SALPINGECTOMY Bilateral 04/13/2020  ? Procedure: OPEN BILATERAL SALPINGECTOMY;  Surgeon: Delsa Bern, MD;  Location: [place];  Service: Gynecology;  Laterality: Bilateral;  ? BREAST REDUCTION SURGERY Bilateral 04/26/2013  ? Procedure: BILATERAL BREAST REDUCTION WITH LIPOSUCTION;  Surgeon: Cristine Polio, MD;  Location: [place];  Service: Plastics;  Laterality: Bilateral;  ? CHOLECYSTECTOMY N/A 05/10/2016  ? Procedure: LAPAROSCOPIC CHOLECYSTECTOMY WITH INTRAOPERATIVE CHOLANGIOGRAM;  Surgeon: Donnie Mesa, MD;  Location: [place];  Service: General;  Laterality: N/A;  ? FOOT SURGERY  2003; 2004  ? each foot - bunion, hammer toe  ? OSTEOCHONDRAL DEFECT REPAIR/RECONSTRUCTION Right 05/14/2021  ? Procedure: right knee osteochondral open allograft fro medial femoral defect;  Surgeon: Meredith Pel, MD;  Location: [place];  Service: Orthopedics;  Laterality: Right;  ? ? ?Social History  ? ?Socioeconomic History  ? Marital status: Married  ?  Spouse name: Not on file  ? Number of children: Not on file  ? Years of education: Not on file  ? Highest education level: Not on file  ?Occupational History  ? Not on file  ?Tobacco Use  ? Smoking status: Never  ? Smokeless tobacco: Never  ?Vaping Use  ? Vaping Use: Never used  ?Substance and Sexual Activity  ?  Alcohol use: Yes  ?  Comment: rarely  ? Drug use: No  ? Sexual activity: Yes  ?  Birth control/protection: I.U.D.  ?Other Topics Concern  ? Not on file  ?Social History Narrative  ? Not on file  ? ?Social Determinants of Health  ? ?Financial Resource Strain: Not on file  ?Food Insecurity: Not on file  ?Transportation Needs: Not on file  ?Physical Activity: Not on file  ?Stress: Not on file  ?Social Connections: Not on file  ?Intimate Partner Violence: Not on file  ? ? ?Current Outpatient Medications on File Prior to Visit  ?Medication Sig Dispense Refill  ? aspirin EC 81 MG tablet Take 81 mg by mouth 2 (two) times daily. Swallow whole.    ? blood glucose meter kit and supplies KIT Dispense based on patient and insurance preference. Use up to four times daily as directed. (FOR ICD-9 250.00, 250.01). 1 each 0  ? chlorhexidine (PERIDEX) 0.12 % solution Use as directed 15 mLs in the mouth or throat daily as needed (gum irritation).    ? Cholecalciferol (VITAMIN D) 50 MCG (2000 UT) CAPS Take 1 capsule (2,000 Units total) by mouth daily. 30 capsule   ? ELDERBERRY PO Take 50 mg by mouth daily.    ? lisinopril-hydrochlorothiazide (ZESTORETIC) 20-12.5 MG tablet TAKE 1 TABLET BY MOUTH EVERY DAY 90 tablet 1  ? methocarbamol (ROBAXIN) 500 MG tablet Take 1 tablet (500 mg total) by mouth every 8 (eight) hours as needed. 30 tablet  0  ? Multiple Vitamins-Minerals (MULTIVITAMIN ADULTS PO) Take by mouth daily.    ? ?No current facility-administered medications on file prior to visit.  ? ? ?No Known Allergies ? ?Family History  ?Problem Relation Age of Onset  ? Migraines Mother   ? Arthritis Mother   ? Deep vein thrombosis Mother   ?     started after ortho surgery; lifetime  ? Hyperlipidemia Mother   ? Cancer Father 68  ?     Prostate  ? Heart disease Father   ?     bypass  ? Heart attack Father 22  ? Hypertension Father   ? Headache Brother   ? Other Brother   ?     ?something with heart; under eval  ? Glaucoma Maternal Grandmother    ? Head & neck cancer Maternal Grandmother   ? Alzheimer's disease Maternal Grandfather   ? Cancer Paternal Grandmother   ?     blood?  ? Cancer Paternal Grandfather   ?     prostate  ? Heart attack Paternal Uncle 27  ? Thyroid disease Neg Hx   ? ? ?BP 110/78 (BP Location: Left Arm, Patient Position: Sitting, Cuff Size: Normal)   Pulse 73   Ht 5' 5.5" (1.664 m)   Wt 232 lb 9.6 oz (105.5 kg)   SpO2 98%   BMI 38.12 kg/m?  ? ? ?Review of Systems ? ?   ?Objective:  ? Physical Exam ?VITAL SIGNS:  See vs page.  ?GENERAL: no distress.   ?NECK: 3x normal size (R>L), with MNG surface. ? ? ?Lab Results  ?Component Value Date  ? TSH 1.32 08/29/2021  ? ?   ?Assessment & Plan:  ?MNG: due for recheck this year.  No medication is needed ? ?Patient Instructions  ?Blood tests are requested for you today.  We'll let you know about the results.   ?Let's recheck the ultrasound.  you will receive a phone call, about a day and time for an appointment.   ?You should have an endocrinology follow-up appointment in 1 year.   ? ? ?

## 2021-08-29 NOTE — Patient Instructions (Addendum)
Blood tests are requested for you today.  We'll let you know about the results.   ?Let's recheck the ultrasound.  you will receive a phone call, about a day and time for an appointment.   ?You should have an endocrinology follow-up appointment in 1 year.   ?

## 2021-09-05 ENCOUNTER — Ambulatory Visit (INDEPENDENT_AMBULATORY_CARE_PROVIDER_SITE_OTHER): Payer: Federal, State, Local not specified - PPO | Admitting: Surgical

## 2021-09-05 ENCOUNTER — Encounter: Payer: Self-pay | Admitting: Orthopedic Surgery

## 2021-09-05 ENCOUNTER — Ambulatory Visit
Admission: RE | Admit: 2021-09-05 | Discharge: 2021-09-05 | Disposition: A | Discharge status: Home / Self Care | Payer: Federal, State, Local not specified - PPO | Source: Ambulatory Visit | Attending: Endocrinology | Admitting: Endocrinology

## 2021-09-05 DIAGNOSIS — M238X2 Other internal derangements of left knee: Secondary | ICD-10-CM

## 2021-09-05 DIAGNOSIS — E042 Nontoxic multinodular goiter: Secondary | ICD-10-CM | POA: Diagnosis not present

## 2021-09-05 NOTE — Progress Notes (Signed)
? ?Office Visit Note ?  ?Patient: Michele [name]           ?Date of Birth: [birth year]           ?MRN: [id number] ?Visit Date: 09/05/2021 ?Requested by: Caren Macadam, MD ?[address] ?[place],  [place] [address] ?PCP: Caren Macadam, MD ? ?Subjective: ?Chief Complaint  ?Patient presents with  ? Left Knee - Follow-up  ? ? ?HPI: Michele [name] is a 46 y.o. female who presents to the office for MRI review. Patient states that since last office visit when she had the injection, she feels "back to normal".  Does not feel like the fluid has reaccumulated.  No mechanical or instability symptoms.  Does not really feel any stiffness in the knee. ? ?MRI results revealed: ?MR Knee Left w/o contrast ? ?Result Date: 08/19/2021 ?CLINICAL DATA:  Left knee pain for several weeks. EXAM: MRI OF THE LEFT KNEE WITHOUT CONTRAST TECHNIQUE: Multiplanar, multisequence MR imaging of the left knee was performed. No intravenous contrast was administered. COMPARISON:  Radiographs dated August 10, 2021 FINDINGS: MENISCI Medial: Intact. Lateral: Intact. LIGAMENTS Cruciates: ACL and PCL are intact. Collaterals: Medial collateral ligament is intact. Lateral collateral ligament complex is intact. CARTILAGE Patellofemoral:  No chondral defect. Medial: Advanced articular cartilage thinning with focal full-thickness cartilage defect. Lateral: Articular cartilage thinning without evidence of full-thickness defect JOINT: Moderate joint effusion. Normal Hoffa's fat-pad. No plical thickening. POPLITEAL FOSSA: Popliteus tendon is intact. No Baker's cyst. EXTENSOR MECHANISM: Intact quadriceps tendon. Intact patellar tendon. Intact lateral patellar retinaculum. Intact medial patellar retinaculum. Intact MPFL. BONES: No aggressive osseous lesion. No fracture or dislocation. Other: No fluid collection or hematoma. Muscles are normal. IMPRESSION: 1. 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 2. Moderate knee joint effusion, likely reactive. 3. Medial and lateral menisci are intact. 4. Cruciate and collateral ligaments are intact. Quadriceps tendon and patellar tendon are also maintained. 5. No evidence of fracture or osteonecrosis. Electronically Signed   By: Keane Police D.O.   On: 08/19/2021 23:23   ? ?             ?ROS: All systems reviewed are negative as they relate to the chief complaint within the history of present illness.  Patient denies fevers or chills. ? ?Assessment & Plan: ?Visit Diagnoses:  ?1. 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 ? ?Plan: Michele [name] is a 46 y.o. female who presents to the office for review of left knee MRI scan.  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  She began to notice left knee symptoms and had subsequent injection with evaluation with MRI scan due to her history of OCD in the right knee.  Left knee MRI reviewed today with her and it does show a focal full-thickness cartilage defect in the medial femoral condyle with no other significant degenerative changes noted in the medial, lateral, patellofemoral compartment.  Discussed the options available to patient.  Main options would be living with her knee as is versus left knee arthroscopy with microfracture and bio cartilage application.  Discussed that the arthroscopic procedure gives her the best chance long-term at avoiding progression of this chondral defect to encompassing medial compartment arthritis.  If left untreated, patient understands that there is a large likelihood that this chondral defect will increase in size and progressed to arthritis of the knee  with likely need for knee replacement in the future though this could be anywhere between 5 to 20 years before she needs this procedure done if at all.  She will consider her options and discuss these options with her family.  Do  think that the best long-term option for this knee would be to have the arthroscopic procedure and she understands this.  Discussed the recovery timeframe.  She will take several weeks in order to make her decision.  If she has any further questions, she is welcome to call the office or send a message on MyChart. ? ?Follow-Up Instructions: No follow-ups on file.  ? ?Orders:  ?No orders of the defined types were placed in this encounter. ? ?No orders of the defined types were placed in this encounter. ? ? ? ? Procedures: ?No procedures performed ? ? ?Clinical Data: ?No additional findings. ? ?Objective: ?Vital Signs: There were no vitals taken for this visit. ? ?Physical Exam:  ?Constitutional: Patient appears well-developed ?HEENT:  ?Head: Normocephalic ?Eyes:EOM are normal ?Neck: Normal range of motion ?Cardiovascular: Normal rate ?Pulmonary/chest: Effort normal ?Neurologic: Patient is alert ?Skin: Skin is warm ?Psychiatric: Patient has normal mood and affect ? ?Ortho Exam: Ortho exam demonstrates left knee with 0 degrees extension and 120 degrees knee flexion.  Positive small effusion.  No calf tenderness.  Negative Homans' sign.  Tenderness over the medial joint line, worse tenderness with hyperflexion of the knee.  No tenderness over the lateral joint line.  No pain with hip range of motion.  Able to perform straight leg raise with good quad strength. ? ?Specialty Comments:  ?No specialty comments available. ? ?Imaging: ?No results found. ? ? ?PMFS History: ?Patient Active Problem List  ? Diagnosis Date Noted  ? Osteochondritis dissecans of right knee   ? Multinodular goiter 12/31/2020  ? Fibroids 04/03/2020  ? Patellofemoral syndrome of right knee 11/24/2019  ? Nonallopathic lesion of lumbar region 09/09/2019  ? Nonallopathic lesion of sacral region 09/09/2019  ? Nonallopathic lesion of thoracic region 09/09/2019  ? Nonallopathic lesion of rib cage 09/09/2019  ? Nonallopathic lesion of cervical region  09/09/2019  ? Acute bursitis of left shoulder 09/09/2019  ? Cholecystitis, acute with cholelithiasis 05/09/2016  ? Gynecomastia, female 02/03/2013  ? Back pain, acute 06/22/2012  ? Obesity 05/25/2010  ? Essential hypertension 05/25/2010  ? ?Past Medical History:  ?Diagnosis Date  ? Diabetes mellitus without complication ([place])   ? Hypertension   ? under control with med., has been on med. x 2 yr.  ? Hypertrophy of breast 03/2013  ? Obesity   ?  ?Family History  ?Problem Relation Age of Onset  ? Migraines Mother   ? Arthritis Mother   ? Deep vein thrombosis Mother   ?     started after ortho surgery; lifetime  ? Hyperlipidemia Mother   ? Cancer Father 39  ?     Prostate  ? Heart disease Father   ?     bypass  ? Heart attack Father 43  ? Hypertension Father   ? Headache Brother   ? Other Brother   ?     ?something with heart; under eval  ? Glaucoma Maternal Grandmother   ? Head & neck cancer Maternal Grandmother   ? Alzheimer's disease Maternal Grandfather   ? Cancer Paternal Grandmother   ?     blood?  ? Cancer Paternal Grandfather   ?     prostate  ? Heart attack Paternal Uncle 43  ?  Thyroid disease Neg Hx   ?  ?Past Surgical History:  ?Procedure Laterality Date  ? ABDOMINAL HYSTERECTOMY N/A 04/13/2020  ? Procedure: HYSTERECTOMY ABDOMINAL;  Surgeon: Delsa Bern, MD;  Location: [place];  Service: Gynecology;  Laterality: N/A;  AUTO INFUSION PROTOCOL  ? BILATERAL SALPINGECTOMY Bilateral 04/13/2020  ? Procedure: OPEN BILATERAL SALPINGECTOMY;  Surgeon: Delsa Bern, MD;  Location: [place];  Service: Gynecology;  Laterality: Bilateral;  ? BREAST REDUCTION SURGERY Bilateral 04/26/2013  ? Procedure: BILATERAL BREAST REDUCTION WITH LIPOSUCTION;  Surgeon: Cristine Polio, MD;  Location: [employer];  Service: Plastics;  Laterality: Bilateral;  ? CHOLECYSTECTOMY N/A 05/10/2016  ? Procedure: LAPAROSCOPIC CHOLECYSTECTOMY WITH INTRAOPERATIVE CHOLANGIOGRAM;  Surgeon: Donnie Mesa, MD;  Location: [place];  Service:  General;  Laterality: N/A;  ? FOOT SURGERY  2003; 2004  ? each foot - bunion, hammer toe  ? OSTEOCHONDRAL DEFECT REPAIR/RECONSTRUCTION Right 05/14/2021  ? Procedure: right knee osteochondral open allograft fro med

## 2021-09-10 ENCOUNTER — Encounter: Payer: Self-pay | Admitting: Family Medicine

## 2021-09-10 ENCOUNTER — Ambulatory Visit (INDEPENDENT_AMBULATORY_CARE_PROVIDER_SITE_OTHER): Payer: Federal, State, Local not specified - PPO | Admitting: Family Medicine

## 2021-09-10 VITALS — BP 102/62 | HR 75 | Temp 98.8°F | Ht 65.75 in | Wt 232.2 lb

## 2021-09-10 DIAGNOSIS — E785 Hyperlipidemia, unspecified: Secondary | ICD-10-CM

## 2021-09-10 DIAGNOSIS — I10 Essential (primary) hypertension: Secondary | ICD-10-CM | POA: Diagnosis not present

## 2021-09-10 DIAGNOSIS — B360 Pityriasis versicolor: Secondary | ICD-10-CM

## 2021-09-10 DIAGNOSIS — Z0000 Encounter for general adult medical examination without abnormal findings: Secondary | ICD-10-CM

## 2021-09-10 DIAGNOSIS — E1165 Type 2 diabetes mellitus with hyperglycemia: Secondary | ICD-10-CM

## 2021-09-10 DIAGNOSIS — R739 Hyperglycemia, unspecified: Secondary | ICD-10-CM

## 2021-09-10 MED ORDER — CLOTRIMAZOLE 1 % EX CREA: 1.0000 "application " | TOPICAL_CREAM | Freq: Two times a day (BID) | CUTANEOUS | 0 refills | Status: DC

## 2021-09-10 NOTE — Progress Notes (Signed)
Michele [name] ?DOB: April 20, 1976 ?Encounter date: 09/10/2021 ? ?This is a 46 y.o. female who presents for complete physical  ? ?History of present illness/Additional concerns: ?Last visit with me was 07/13/21.  ? ?HTN: Lisinopril-hydrochlorothiazide 20-12.5. has been running well.  ?DMII: Diet and exercise controlled. Not checking blood sugars regularly. Eating pretty well. Work schedule changed and she can't walk in mornings, but planning to get back on track with regular exercise. Her office is closing down and she will be at home full time so will have more time for this.  ? ?Left knee pain: following with ortho. Considering arthroscopic surgery for chondral defect. She noted when she was in therapy that left knee was feeling tight. Since getting fluid off the knee it's not bothering her. ? ?Following with endo, Dr. Loanne Drilling: multinodular goiter. [place] repeated 09/05/21: enlarged [place] thyroid gland. No new or enlarging thyroid nodules; previously biopsied nodules remain similar in size/morphology and other small or benign appearing nodules do not meet criteria for follow up or biopsy. ? ?Follows with Dr. Cletis Media for gyn care. Last visit was august. Had mammogram at that time. Per patient states it was ok. Last pap 2020.  ? ?Has some pale spots appearing across forehead. Not itchy or tender. Not noted anywhere else on body. Was using vitamin D, but no improvement. Noticed after her surgery. No changes in skin care routine.  ? ?Past Medical History:  ?Diagnosis Date  ? Cholecystitis, acute with cholelithiasis 05/09/2016  ? Diabetes mellitus without complication ([place])   ? Hypertension   ? under control with med., has been on med. x 2 yr.  ? Hypertrophy of breast 03/2013  ? Obesity   ? ?Past Surgical History:  ?Procedure Laterality Date  ? ABDOMINAL HYSTERECTOMY N/A 04/13/2020  ? Procedure: HYSTERECTOMY ABDOMINAL;  Surgeon: Delsa Bern, MD;  Location: [place];  Service: Gynecology;  Laterality: N/A;  AUTO INFUSION PROTOCOL   ? BILATERAL SALPINGECTOMY Bilateral 04/13/2020  ? Procedure: OPEN BILATERAL SALPINGECTOMY;  Surgeon: Delsa Bern, MD;  Location: [place];  Service: Gynecology;  Laterality: Bilateral;  ? BREAST REDUCTION SURGERY Bilateral 04/26/2013  ? Procedure: BILATERAL BREAST REDUCTION WITH LIPOSUCTION;  Surgeon: Cristine Polio, MD;  Location: [place];  Service: Plastics;  Laterality: Bilateral;  ? CHOLECYSTECTOMY N/A 05/10/2016  ? Procedure: LAPAROSCOPIC CHOLECYSTECTOMY WITH INTRAOPERATIVE CHOLANGIOGRAM;  Surgeon: Donnie Mesa, MD;  Location: [place];  Service: General;  Laterality: N/A;  ? FOOT SURGERY  2003; 2004  ? each foot - bunion, hammer toe  ? OSTEOCHONDRAL DEFECT REPAIR/RECONSTRUCTION Right 05/14/2021  ? Procedure: right knee osteochondral open allograft fro medial femoral defect;  Surgeon: Meredith Pel, MD;  Location: [place];  Service: Orthopedics;  Laterality: Right;  ? ?No Known Allergies ?Current Meds  ?Medication Sig  ? blood glucose meter kit and supplies KIT Dispense based on patient and insurance preference. Use up to four times daily as directed. (FOR ICD-9 250.00, 250.01).  ? chlorhexidine (PERIDEX) 0.12 % solution Use as directed 15 mLs in the mouth or throat daily as needed (gum irritation).  ? Cholecalciferol (VITAMIN D) 50 MCG (2000 UT) CAPS Take 1 capsule (2,000 Units total) by mouth daily.  ? clotrimazole (CLOTRIMAZOLE AF) 1 % cream Apply 1 application. topically 2 (two) times daily.  ? ELDERBERRY PO Take 50 mg by mouth daily.  ? lisinopril-hydrochlorothiazide (ZESTORETIC) 20-12.5 MG tablet TAKE 1 TABLET BY MOUTH EVERY DAY  ? Multiple Vitamins-Minerals (MULTIVITAMIN ADULTS PO) Take by mouth daily.  ? ?Social  History  ? ?Tobacco Use  ? Smoking status: Never  ? Smokeless tobacco: Never  ?Substance Use Topics  ? Alcohol use: Yes  ?  Comment: rarely  ? ?Family History  ?Problem Relation Age of Onset  ? Migraines Mother   ? Arthritis Mother   ? Deep vein  thrombosis Mother   ?     started after ortho surgery; lifetime  ? Hyperlipidemia Mother   ? Cancer Father 7  ?     Prostate  ? Heart disease Father   ?     bypass  ? Heart attack Father 43  ? Hypertension Father   ? Headache Brother   ? Other Brother   ?     ?something with heart; under eval  ? Glaucoma Maternal Grandmother   ? Head & neck cancer Maternal Grandmother   ? Alzheimer's disease Maternal Grandfather   ? Cancer Paternal Grandmother   ?     blood?  ? Cancer Paternal Grandfather   ?     prostate  ? Heart attack Paternal Uncle 30  ? Thyroid disease Neg Hx   ? ? ? ?Review of Systems  ?Constitutional:  Negative for activity change, appetite change, chills, fatigue, fever and unexpected weight change.  ?HENT:  Negative for congestion, ear pain, hearing loss, sinus pressure, sinus pain, sore throat and trouble swallowing.   ?Eyes:  Negative for pain and visual disturbance.  ?Respiratory:  Negative for cough, chest tightness, shortness of breath and wheezing.   ?Cardiovascular:  Negative for chest pain, palpitations and leg swelling.  ?Gastrointestinal:  Negative for abdominal pain, blood in stool, constipation, diarrhea, nausea and vomiting.  ?Genitourinary:  Negative for difficulty urinating and menstrual problem.  ?Musculoskeletal:  Negative for arthralgias and back pain.  ?Skin:  Negative for rash.  ?Neurological:  Negative for dizziness, weakness, numbness and headaches.  ?Hematological:  Negative for adenopathy. Does not bruise/bleed easily.  ?Psychiatric/Behavioral:  Negative for sleep disturbance and suicidal ideas. The patient is not nervous/anxious.   ? ?CBC:  ?Lab Results  ?Component Value Date  ? WBC 10.5 07/13/2021  ? HGB 12.1 07/13/2021  ? HCT 37.3 07/13/2021  ? MCH 28.1 07/13/2021  ? MCHC 32.4 07/13/2021  ? RDW 13.0 07/13/2021  ? PLT 336 07/13/2021  ? MPV 10.5 07/13/2021  ? ?CMP: ?Lab Results  ?Component Value Date  ? NA 141 07/13/2021  ? K 4.0 07/13/2021  ? CL 104 07/13/2021  ? CO2 24 07/13/2021   ? ANIONGAP 10 05/10/2021  ? GLUCOSE 105 (H) 07/13/2021  ? BUN 14 07/13/2021  ? CREATININE 0.75 07/13/2021  ? GFRAA >60 05/14/2016  ? GFRAA >89 05/06/2016  ? CALCIUM 9.9 07/13/2021  ? PROT 7.3 07/13/2021  ? BILITOT 0.5 07/13/2021  ? ALKPHOS 83 03/09/2021  ? ALT 12 07/13/2021  ? AST 13 07/13/2021  ? ?LIPID: ?Lab Results  ?Component Value Date  ? CHOL 159 07/13/2021  ? TRIG 70 07/13/2021  ? HDL 48 (L) 07/13/2021  ? [place] 95 07/13/2021  ? ? ?Objective: ? ?BP 102/62 (BP Location: Left Arm, Patient Position: Sitting, Cuff Size: Large)   Pulse 75   Temp 98.8 ?F (37.1 ?C) (Oral)   Ht 5' 5.75" (1.67 m)   Wt 232 lb 3.2 oz (105.3 kg)   SpO2 99%   BMI 37.76 kg/m?   Weight: 232 lb 3.2 oz (105.3 kg)  ? ?BP Readings from Last 3 Encounters:  ?09/10/21 102/62  ?08/29/21 110/78  ?07/13/21 118/72  ? ?Wt Readings  from Last 3 Encounters:  ?09/10/21 232 lb 3.2 oz (105.3 kg)  ?08/29/21 232 lb 9.6 oz (105.5 kg)  ?07/13/21 230 lb 1.6 oz (104.4 kg)  ? ? ?Physical Exam ?Constitutional:   ?   General: She is not in acute distress. ?   Appearance: She is well-developed.  ?HENT:  ?   Head: Normocephalic and atraumatic.  ?   Right Ear: External ear normal.  ?   Left Ear: External ear normal.  ?   Mouth/Throat:  ?   Pharynx: No oropharyngeal exudate.  ?Eyes:  ?   Conjunctiva/sclera: Conjunctivae normal.  ?   Pupils: Pupils are equal, round, and reactive to light.  ?Neck:  ?   Thyroid: No thyromegaly.  ?Cardiovascular:  ?   Rate and Rhythm: Normal rate and regular rhythm.  ?   Heart sounds: Normal heart sounds. No murmur heard. ?  No friction rub. No gallop.  ?Pulmonary:  ?   Effort: Pulmonary effort is normal.  ?   Breath sounds: Normal breath sounds.  ?Abdominal:  ?   General: Bowel sounds are normal. There is no distension.  ?   Palpations: Abdomen is soft. There is no mass.  ?   Tenderness: There is no abdominal tenderness. There is no guarding.  ?   Hernia: No hernia is present.  ?Musculoskeletal:     ?   General: No tenderness or  deformity. Normal range of motion.  ?   Cervical back: Normal range of motion and neck supple.  ?Lymphadenopathy:  ?   Cervical: No cervical adenopathy.  ?Skin: ?   General: Skin is warm and dry.  ?   Findings: No rash

## 2021-09-10 NOTE — Patient Instructions (Signed)
Let us know if your blood pressure is regularly less than 110/65. We may be able to decrease medication in that case.  ?

## 2021-09-22 IMAGING — CR DG ABDOMEN 1V
1 series · 1 of 1 positions shown · non-contrast
Comparison: No prior abdomen or pelvic imaging. Lumbar radiograph
06/09/2019

CLINICAL DATA: IUD checkup.

EXAM:
ABDOMEN - 1 VIEW

[t abdomen supine]
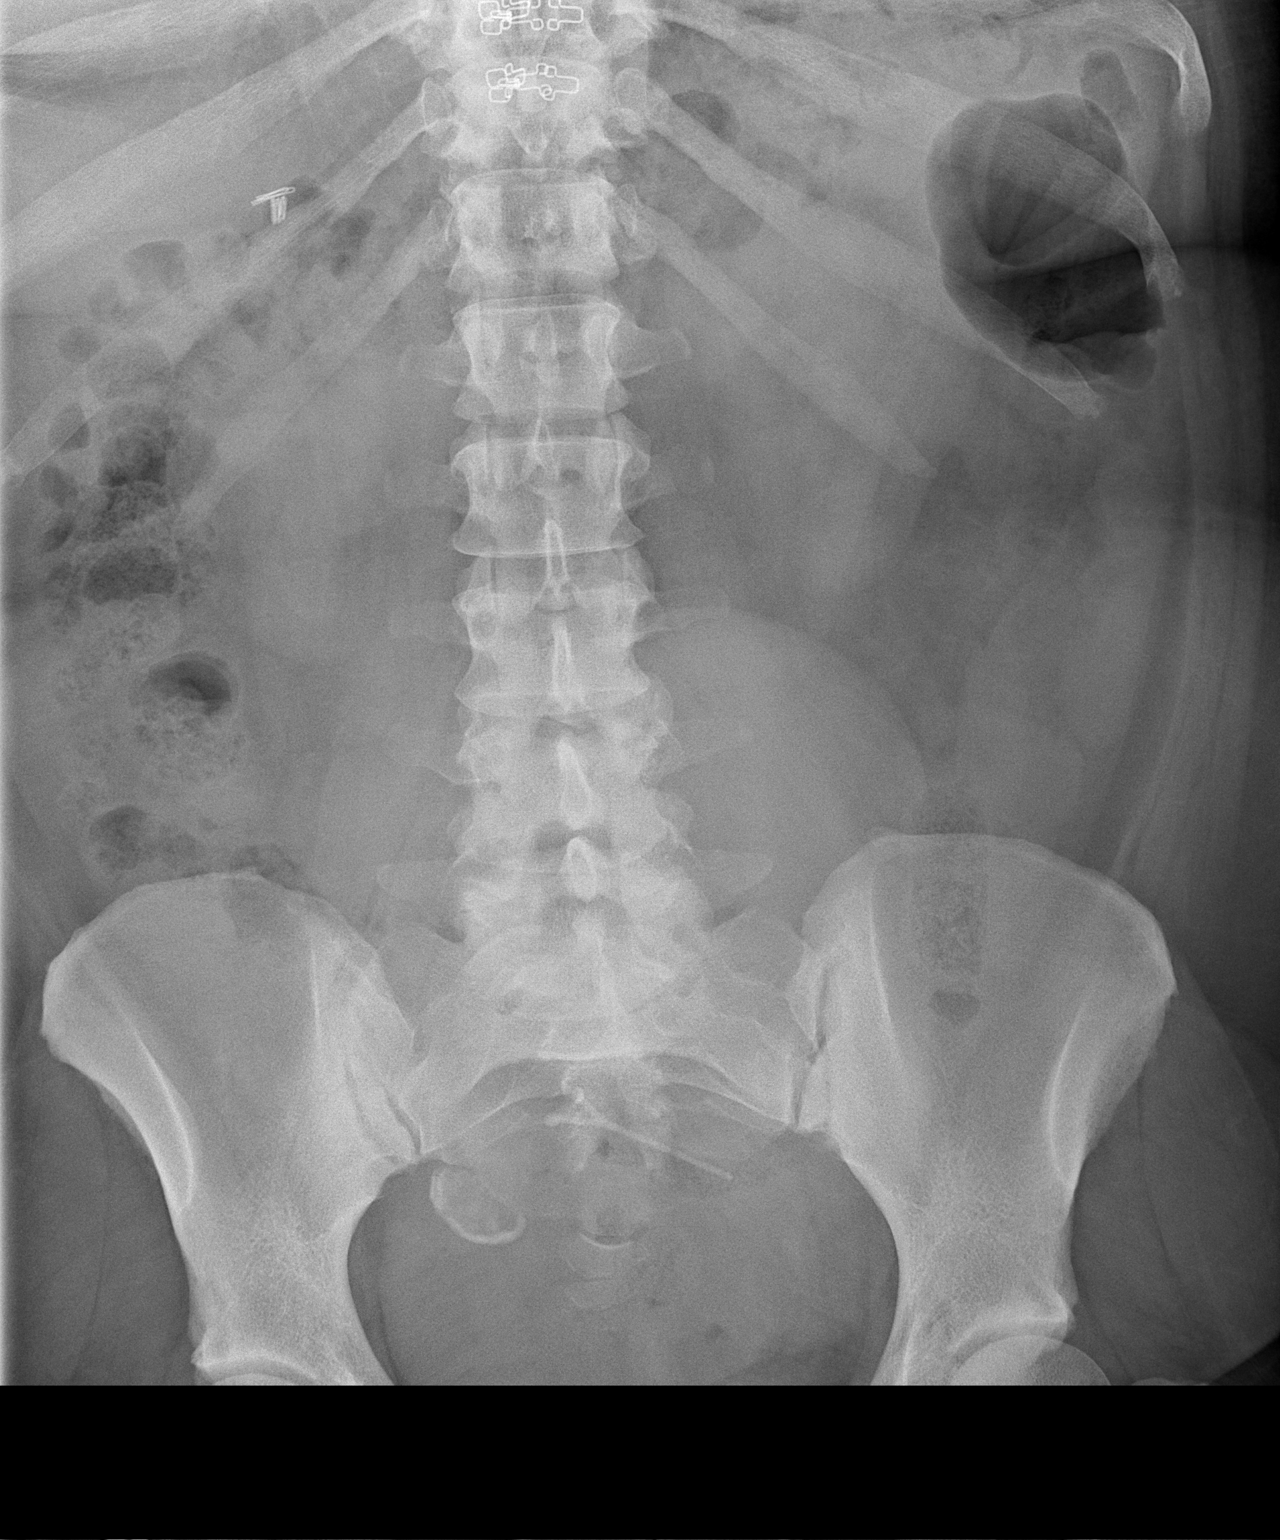

[1 of 1 positions shown; findings below may reference images not displayed]

FINDINGS: There is a Y-shaped IUD in the central pelvis, the stem is directed
to the left with arms directed to the right. This is in the midline.
There is a 2.9 cm curvilinear calcification in the right pelvis just
below the sacrum. Surgical clips in the right upper quadrant typical
of cholecystectomy. Normal bowel gas pattern without evidence of
obstruction. Stable osseous structures from prior.
IMPRESSION: 1. A Y-shaped IUD in the central pelvis, the stem is directed to the
left with arms directed to the right. This is unchanged in position
from prior lumbar spine radiograph, however more detailed
localization is limited on this single supine view.
2. A 2.9 cm curvilinear calcification in the right pelvis just below
the sacrum may be a uterine fibroid.

## 2021-10-09 ENCOUNTER — Other Ambulatory Visit: Payer: Self-pay | Admitting: *Deleted

## 2021-10-09 MED ORDER — LISINOPRIL-HYDROCHLOROTHIAZIDE 20-12.5 MG PO TABS: 1.0000 | ORAL_TABLET | Freq: Every day | ORAL | 1 refills | Status: DC

## 2021-12-24 NOTE — Telephone Encounter (Signed)
Cloretta Ned, can you set up [facility] Dinning to see Dr. Marlou Sa on Friday

## 2021-12-28 ENCOUNTER — Ambulatory Visit (INDEPENDENT_AMBULATORY_CARE_PROVIDER_SITE_OTHER): Payer: Federal, State, Local not specified - PPO | Admitting: Orthopedic Surgery

## 2021-12-28 ENCOUNTER — Encounter: Payer: Self-pay | Admitting: Orthopedic Surgery

## 2021-12-28 DIAGNOSIS — M238X2 Other internal derangements of left knee: Secondary | ICD-10-CM | POA: Diagnosis not present

## 2021-12-28 MED ORDER — METHYLPREDNISOLONE ACETATE 40 MG/ML IJ SUSP
40.0000 mg | INTRAMUSCULAR | Status: AC | PRN
  Administered 2021-12-28: 40 mg via INTRA_ARTICULAR

## 2021-12-28 MED ORDER — LIDOCAINE HCL 1 % IJ SOLN
5.0000 mL | INTRAMUSCULAR | Status: AC | PRN
  Administered 2021-12-28: 5 mL

## 2021-12-28 MED ORDER — BUPIVACAINE HCL 0.25 % IJ SOLN
4.0000 mL | INTRAMUSCULAR | Status: AC | PRN
  Administered 2021-12-28: 4 mL via INTRA_ARTICULAR

## 2021-12-28 NOTE — Progress Notes (Signed)
Office Visit Note   Patient: Michele [name]           Date of Birth: [birth year]           MRN: [id number] Visit Date: 12/28/2021 Requested by: No referring provider defined for this encounter. PCP: Caren Macadam, MD (Inactive)  Subjective: Chief Complaint  Patient presents with   Left Knee - Pain    HPI: Michele [name] is a 46 year old patient with left knee pain.  Has known history of chondral defect on the medial femoral condyle.  Has developed some swelling.  Doing reasonably well from her right knee osteochondral allograft.  Has had prior aspiration and injection before which helped.  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              ROS: All systems reviewed are negative as they relate to the chief complaint within the history of present illness.  Patient denies  fevers or chills.   Assessment & Plan: Visit Diagnoses: No diagnosis found.  Plan: Impression is left knee osteochondral defect and effusion.  Plan is aspiration and injection today with nonweightbearing quad strengthening exercises.  Discussed the change in exercise plan with her.  Follow-up in November.  May discuss operative treatment at that time which would be arthroscopy and possible microfracture and bio cartilage.  Defect size slightly smaller on this left knee than the right knee.  Follow-Up Instructions: No follow-ups on file.   Orders:  No orders of the defined types were placed in this encounter.  No orders of the defined types were placed in this encounter.     Procedures: Large Joint Inj: L knee on 12/28/2021 9:33 PM Indications: diagnostic evaluation, joint swelling and pain Details: 18 G 1.5 in needle, superolateral approach  Arthrogram: No  Medications: 5 mL lidocaine 1 %; 40 mg methylPREDNISolone acetate 40 MG/ML; 4 mL bupivacaine 0.25 % Outcome: tolerated well, no immediate complications Procedure, treatment alternatives, risks and benefits explained, specific risks  discussed. Consent was given by the patient. Immediately prior to procedure a time out was called to verify the correct patient, procedure, equipment, support staff and site/side marked as required. Patient was prepped and draped in the usual sterile fashion.       Clinical Data: No additional findings.  Objective: Vital Signs: There were no vitals taken for this visit.  Physical Exam:   Constitutional: Patient appears well-developed HEENT:  Head: Normocephalic Eyes:EOM are normal Neck: Normal range of motion Cardiovascular: Normal rate Pulmonary/chest: Effort normal Neurologic: Patient is alert Skin: Skin is warm Psychiatric: Patient has normal mood and affect   Ortho Exam: Ortho exam demonstrates moderate effusion in the left knee.  Extensor mechanism intact.  Collateral cruciate ligaments are stable.  Pedal pulses palpable.  Ankle dorsiflexion intact.  No groin pain with internal/external rotation of the left hip.  Gait antalgic to the left.  Specialty Comments:  No specialty comments available.  Imaging: No results found.   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   Nonallopathic lesion of lumbar region 09/09/2019   Nonallopathic lesion of sacral region 09/09/2019   Nonallopathic lesion of thoracic region 09/09/2019   Nonallopathic lesion of rib cage 09/09/2019   Nonallopathic lesion of cervical region 09/09/2019   Acute bursitis of left shoulder 09/09/2019  Gynecomastia, female 02/03/2013   Back pain, acute 06/22/2012   Obesity 05/25/2010   Essential hypertension 05/25/2010   Past Medical History:  Diagnosis Date   Cholecystitis, acute with cholelithiasis 05/09/2016   Diabetes mellitus without complication ([place])    Hypertension    under control with med., has been on med. x 2 yr.    Hypertrophy of breast 03/2013   Obesity     Family History  Problem Relation Age of Onset   Migraines Mother    Arthritis Mother    Deep vein thrombosis Mother        started after ortho surgery; lifetime   Hyperlipidemia Mother    Cancer Father 24       Prostate   Heart disease Father        bypass   Heart attack Father 54   Hypertension Father    Headache Brother    Other Brother        ?something with heart; under eval   Glaucoma Maternal Grandmother    Head & neck cancer Maternal Grandmother    Alzheimer's disease Maternal Grandfather    Cancer Paternal Grandmother        blood?   Cancer Paternal Grandfather        prostate   Heart attack Paternal Uncle 62   Thyroid disease Neg Hx     Past Surgical History:  Procedure Laterality Date   ABDOMINAL HYSTERECTOMY N/A 04/13/2020   Procedure: HYSTERECTOMY ABDOMINAL;  Surgeon: Delsa Bern, MD;  Location: [facility];  Service: Gynecology;  Laterality: N/A;  AUTO INFUSION PROTOCOL   BILATERAL SALPINGECTOMY Bilateral 04/13/2020   Procedure: OPEN BILATERAL SALPINGECTOMY;  Surgeon: Delsa Bern, MD;  Location: [place];  Service: Gynecology;  Laterality: Bilateral;   BREAST REDUCTION SURGERY Bilateral 04/26/2013   Procedure: BILATERAL BREAST REDUCTION WITH LIPOSUCTION;  Surgeon: Cristine Polio, MD;  Location: [place] [place];  Service: Plastics;  Laterality: Bilateral;   CHOLECYSTECTOMY N/A 05/10/2016   Procedure: LAPAROSCOPIC CHOLECYSTECTOMY WITH INTRAOPERATIVE CHOLANGIOGRAM;  Surgeon: Donnie Mesa, MD;  Location: [facility];  Service: General;  Laterality: N/A;   FOOT SURGERY  2003; 2004   each foot - bunion, hammer toe   OSTEOCHONDRAL DEFECT REPAIR/RECONSTRUCTION Right 05/14/2021   Procedure: right knee osteochondral open allograft fro medial femoral defect;  Surgeon: Meredith Pel, MD;  Location: [facility];  Service: Orthopedics;  Laterality: Right;   Social History   Occupational History   Not on  file  Tobacco Use   Smoking status: Never   Smokeless tobacco: Never  Vaping Use   Vaping Use: Never used  Substance and Sexual Activity   Alcohol use: Yes    Comment: rarely   Drug use: No   Sexual activity: Yes    Birth control/protection: I.U.D.

## 2022-01-02 DIAGNOSIS — Z01419 Encounter for gynecological examination (general) (routine) without abnormal findings: Secondary | ICD-10-CM | POA: Diagnosis not present

## 2022-01-02 DIAGNOSIS — Z1231 Encounter for screening mammogram for malignant neoplasm of breast: Secondary | ICD-10-CM | POA: Diagnosis not present

## 2022-03-18 ENCOUNTER — Ambulatory Visit: Payer: Federal, State, Local not specified - PPO | Admitting: Orthopedic Surgery

## 2022-03-20 ENCOUNTER — Ambulatory Visit (INDEPENDENT_AMBULATORY_CARE_PROVIDER_SITE_OTHER): Payer: Federal, State, Local not specified - PPO | Admitting: Surgical

## 2022-03-20 DIAGNOSIS — M238X2 Other internal derangements of left knee: Secondary | ICD-10-CM

## 2022-03-22 ENCOUNTER — Encounter: Payer: Self-pay | Admitting: Orthopedic Surgery

## 2022-03-22 MED ORDER — BUPIVACAINE HCL 0.25 % IJ SOLN
4.0000 mL | INTRAMUSCULAR | Status: AC | PRN
  Administered 2022-03-20: 4 mL via INTRA_ARTICULAR

## 2022-03-22 MED ORDER — LIDOCAINE HCL 1 % IJ SOLN
5.0000 mL | INTRAMUSCULAR | Status: AC | PRN
  Administered 2022-03-20: 5 mL

## 2022-03-22 MED ORDER — METHYLPREDNISOLONE ACETATE 40 MG/ML IJ SUSP
40.0000 mg | INTRAMUSCULAR | Status: AC | PRN
  Administered 2022-03-20: 40 mg via INTRA_ARTICULAR

## 2022-03-22 NOTE — Progress Notes (Signed)
Office Visit Note   Patient: Michele [name]           Date of Birth: 04-14-1976           MRN: [id number] Visit Date: 03/20/2022 Requested by: No referring provider defined for this encounter. PCP: Caren Macadam, MD (Inactive)  Subjective: Chief Complaint  Patient presents with   Left Leg - Follow-up    HPI: Michele [name] is a 46 y.o. female who presents to the office reporting left knee swelling.  Swelling returned several weeks ago after aspiration/injection by Dr. Marlou Sa about 3 months ago.  She has history of left knee medial femoral condyle chondral defect.  No new injury.  Not really having any mechanical symptoms.  Just a lot of swelling.  She did have good relief from the pain from the last injection..                ROS: All systems reviewed are negative as they relate to the chief complaint within the history of present illness.  Patient denies fevers or chills.  Assessment & Plan: Visit Diagnoses:  1. 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  She returns with recurrent swelling of her left knee following aspiration injection at her last visit.  She has small to medium size chondral defect that was noted on MRI scan earlier this year in the spring.  We have discussed surgical intervention for this problem before and discussed that again today.  She also has history of right knee osteochondral allograft that is doing well and not really having any problems with the right knee.  She would like to avoid such a procedure on the left.  After discussing options, she would like to proceed with aspiration and injection of the left knee again today with return in March 2 repeat MRI and discuss surgical intervention at that time.  Need repeat MRI at that time in order to evaluate the size of the chondral defect at that point in time.  She understands that delaying surgery for another 6  months or so increases the likelihood that we will need to do an osteochondral allograft rather than just doing microfracture with bio cartilage through arthroscopy.  She would still like to proceed.  50 cc of inflammatory nonpurulent synovial fluid was aspirated from the left knee and cortisone injection administered.  Patient tolerated procedure well.  Follow-up with the office in March.  Follow-Up Instructions: No follow-ups on file.   Orders:  No orders of the defined types were placed in this encounter.  No orders of the defined types were placed in this encounter.     Procedures: Large Joint Inj: L knee on 03/20/2022 1:27 PM Indications: diagnostic evaluation, joint swelling and pain Details: 18 G 1.5 in needle, superolateral approach  Arthrogram: No  Medications: 5 mL lidocaine 1 %; 40 mg methylPREDNISolone acetate 40 MG/ML; 4 mL bupivacaine 0.25 % Aspirate: 50 mL Outcome: tolerated well, no immediate complications Procedure, treatment alternatives, risks and benefits explained, specific risks discussed. Consent was given by the patient. Immediately prior to procedure a time out was called to verify the correct patient, procedure, equipment, support staff and site/side marked as required. Patient was prepped and draped in the usual sterile fashion.       Clinical Data: No additional findings.  Objective: Vital Signs: There were no vitals taken for this visit.  Physical Exam:  Constitutional: Patient appears well-developed HEENT:  Head: Normocephalic Eyes:EOM are normal Neck: Normal range of motion Cardiovascular: Normal rate Pulmonary/chest: Effort normal Neurologic: Patient is alert Skin: Skin is warm Psychiatric: Patient has normal mood and affect  Ortho Exam: Ortho exam demonstrates left knee with large effusion.  She has tenderness over the medial joint line, primarily over the femur and no tenderness over the lateral joint line.  Excellent range of motion with  0 degrees extension and 125 degrees of knee flexion.  No calf tenderness.  Negative Homans' sign.  She has no pain with hip range of motion.  Able to perform straight leg raise without extensor lag and excellent quad strength rated 5/5.  No cellulitis or skin changes noted.  Ambulates without antalgia.  Specialty Comments:  No specialty comments available.  Imaging: No results found.   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   Nonallopathic lesion of lumbar region 09/09/2019   Nonallopathic lesion of sacral region 09/09/2019   Nonallopathic lesion of thoracic region 09/09/2019   Nonallopathic lesion of rib cage 09/09/2019   Nonallopathic lesion of cervical region 09/09/2019   Acute bursitis of left shoulder 09/09/2019   Gynecomastia, female 02/03/2013   Back pain, acute 06/22/2012   Obesity 05/25/2010   Essential hypertension 05/25/2010   Past Medical History:  Diagnosis Date   Cholecystitis, acute with cholelithiasis 05/09/2016   Diabetes mellitus without complication ([place])    Hypertension    under control with med., has been on med. x 2 yr.   Hypertrophy of breast 03/2013   Obesity     Family History  Problem Relation Age of Onset   Migraines Mother    Arthritis Mother    Deep vein thrombosis Mother        started after ortho surgery; lifetime   Hyperlipidemia Mother    Cancer Father 31       Prostate   Heart disease Father        bypass   Heart attack Father 9   Hypertension Father    Headache Brother    Other Brother        ?something with heart; under eval   Glaucoma Maternal Grandmother    Head & neck cancer Maternal Grandmother    Alzheimer's disease Maternal Grandfather    Cancer Paternal Grandmother        blood?   Cancer Paternal Grandfather        prostate   Heart attack  Paternal Uncle 89   Thyroid disease Neg Hx     Past Surgical History:  Procedure Laterality Date   ABDOMINAL HYSTERECTOMY N/A 04/13/2020   Procedure: HYSTERECTOMY ABDOMINAL;  Surgeon: Delsa Bern, MD;  Location: [place];  Service: Gynecology;  Laterality: N/A;  AUTO INFUSION PROTOCOL   BILATERAL SALPINGECTOMY Bilateral 04/13/2020   Procedure: OPEN BILATERAL SALPINGECTOMY;  Surgeon: Delsa Bern, MD;  Location: [place];  Service: Gynecology;  Laterality: Bilateral;   BREAST REDUCTION SURGERY Bilateral 04/26/2013   Procedure: BILATERAL BREAST REDUCTION WITH LIPOSUCTION;  Surgeon: Cristine Polio, MD;  Location: [place];  Service: Plastics;  Laterality: Bilateral;   CHOLECYSTECTOMY N/A 05/10/2016   Procedure: LAPAROSCOPIC CHOLECYSTECTOMY WITH INTRAOPERATIVE CHOLANGIOGRAM;  Surgeon: Donnie Mesa, MD;  Location: [place];  Service: General;  Laterality: N/A;   FOOT SURGERY  2003; 2004  each foot - bunion, hammer toe   OSTEOCHONDRAL DEFECT REPAIR/RECONSTRUCTION Right 05/14/2021   Procedure: right knee osteochondral open allograft fro medial femoral defect;  Surgeon: Meredith Pel, MD;  Location: [place];  Service: Orthopedics;  Laterality: Right;   Social History   Occupational History   Not on file  Tobacco Use   Smoking status: Never   Smokeless tobacco: Never  Vaping Use   Vaping Use: Never used  Substance and Sexual Activity   Alcohol use: Yes    Comment: rarely   Drug use: No   Sexual activity: Yes    Birth control/protection: I.U.D.

## 2022-04-05 ENCOUNTER — Other Ambulatory Visit: Payer: Self-pay | Admitting: Family

## 2022-04-16 ENCOUNTER — Telehealth: Payer: Self-pay | Admitting: Family Medicine

## 2022-04-16 NOTE — Telephone Encounter (Signed)
Pt was formerly a Pt with Dr. Ethlyn Gallery. Pt would like a TOC to Dr. Volanda Napoleon. Would MD be willing to take her on as a Pt?  LOV:  05/23  Also, Pt called to request a refill of the following lisinopril-hydrochlorothiazide (ZESTORETIC) 20-12.5 MG tablet  Please advise.  [id number] [place] Phone: [phone] Fax: [phone]

## 2022-05-01 NOTE — Telephone Encounter (Signed)
Ok

## 2022-05-07 NOTE — Telephone Encounter (Signed)
Pt's TOC scheduled for 05/15/22.

## 2022-05-15 ENCOUNTER — Ambulatory Visit: Payer: Federal, State, Local not specified - PPO | Admitting: Family Medicine

## 2022-05-15 ENCOUNTER — Encounter: Payer: Self-pay | Admitting: Family Medicine

## 2022-05-15 VITALS — BP 118/82 | HR 79 | Temp 98.8°F | Ht 65.75 in | Wt 233.6 lb

## 2022-05-15 DIAGNOSIS — E042 Nontoxic multinodular goiter: Secondary | ICD-10-CM

## 2022-05-15 DIAGNOSIS — Z23 Encounter for immunization: Secondary | ICD-10-CM

## 2022-05-15 DIAGNOSIS — I10 Essential (primary) hypertension: Secondary | ICD-10-CM | POA: Diagnosis not present

## 2022-05-15 DIAGNOSIS — R7303 Prediabetes: Secondary | ICD-10-CM | POA: Diagnosis not present

## 2022-05-15 MED ORDER — LISINOPRIL-HYDROCHLOROTHIAZIDE 20-12.5 MG PO TABS: 1.0000 | ORAL_TABLET | Freq: Every day | ORAL | 3 refills | Status: DC

## 2022-05-15 NOTE — Progress Notes (Signed)
Subjective:    Patient ID: Michele [name], female    DOB: Nov 11, 1975, 47 y.o.   MRN: [id number]  Chief Complaint  Patient presents with   Establish Care    HPI Patient was seen today for f/u and TOC, previously seen by Dr. Inocente Salles.  Pt states she is doing well overall.  Taking lisinopril-hydrochlorothiazide 20-12.5 mg daily.  BP typically 120s over 80s at home.  Patient denies headaches, CP, LE edema.  Seen by Ortho for knee pain.  Status post right knee surgery.  Now experiencing left knee edema.  Now walking 2 miles 3 times per week for exercise due to to the pain.  H/o preDM.  Doing lifestyle modifications.  Past Medical History:  Diagnosis Date   Cholecystitis, acute with cholelithiasis 05/09/2016   Diabetes mellitus without complication ([place])    Hypertension    under control with med., has been on med. x 2 yr.   Hypertrophy of breast 03/2013   Obesity     No Known Allergies  ROS General: Denies fever, chills, night sweats, changes in weight, changes in appetite HEENT: Denies headaches, ear pain, changes in vision, rhinorrhea, sore throat +thyromegaly CV: Denies CP, palpitations, SOB, orthopnea Pulm: Denies SOB, cough, wheezing GI: Denies abdominal pain, nausea, vomiting, diarrhea, constipation GU: Denies dysuria, hematuria, frequency, vaginal discharge Msk: Denies muscle cramps, joint pains +L knee pain Neuro: Denies weakness, numbness, tingling Skin: Denies rashes, bruising Psych: Denies depression, anxiety, hallucinations      Objective:    Blood pressure 118/82, pulse 79, temperature 98.8 F (37.1 C), temperature source Oral, height 5' 5.75" (1.67 m), weight 233 lb 9.6 oz (106 kg), SpO2 100 %.  Gen. Pleasant, well-nourished, in no distress, normal affect   HEENT: /AT, face symmetric, conjunctiva clear, no scleral icterus, PERRLA, EOMI, nares patent without drainage, pharynx without erythema or exudate. Neck: No JVD, thyromegaly right lobe greater than  left with palpable nodule in right lobe.  Lungs: no accessory muscle use, CTAB, no wheezes or rales Cardiovascular: RRR, no m/r/g, no peripheral edema Musculoskeletal: No deformities, no cyanosis or clubbing, normal tone Neuro:  A&Ox3, CN II-XII intact, normal gait Skin:  Warm, no lesions/ rash   Wt Readings from Last 3 Encounters:  05/15/22 233 lb 9.6 oz (106 kg)  09/10/21 232 lb 3.2 oz (105.3 kg)  08/29/21 232 lb 9.6 oz (105.5 kg)    Lab Results  Component Value Date   WBC 10.5 07/13/2021   HGB 12.1 07/13/2021   HCT 37.3 07/13/2021   PLT 336 07/13/2021   GLUCOSE 105 (H) 07/13/2021   CHOL 159 07/13/2021   TRIG 70 07/13/2021   HDL 48 (L) 07/13/2021   LDLCALC 95 07/13/2021   ALT 12 07/13/2021   AST 13 07/13/2021   NA 141 07/13/2021   K 4.0 07/13/2021   CL 104 07/13/2021   CREATININE 0.75 07/13/2021   BUN 14 07/13/2021   CO2 24 07/13/2021   TSH 1.32 08/29/2021   HGBA1C 6.1 (H) 07/13/2021   MICROALBUR 0.2 07/13/2021    Assessment/Plan:  Essential hypertension  -Controlled -Continue current medications - Plan: lisinopril-hydrochlorothiazide (ZESTORETIC) 20-12.5 MG tablet  Nontoxic multinodular goiter -Stable -Thyroid ultrasound on 09/05/2021 with enlarged multinodular thyroid gland.  No new or enlarging thyroid nodules.  Nodules labeled 1-3 in the right thyroid lobe for previously biopsied remain similar in size and morphology.  Additional small and/or benign-appearing thyroid nodules do not meet criteria for further dedicated follow-up for biopsy. -Continue to monitor  Need for  immunization against influenza  - Plan: Flu Vaccine QUAD 6+ mos PF IM (Fluarix Quad PF)  Prediabetes -Hemoglobin A1c 6.1% on 07/13/2021. -Lifestyle modifications -Will check A1c at next [facility] for CPE.  F/u as needed in the next few months for CPE.  Grier Mitts, MD

## 2022-06-01 IMAGING — US US THYROID
1 series · 13 of 25 positions shown · non-contrast
Comparison: None.

CLINICAL DATA: Thyromegaly

EXAM:
THYROID ULTRASOUND
TECHNIQUE: Ultrasound examination of the thyroid gland and adjacent soft
tissues was performed.

[Series 1: us thyroid · 0.08mm/px · 13 of 39 slices shown]
[im 1/39]
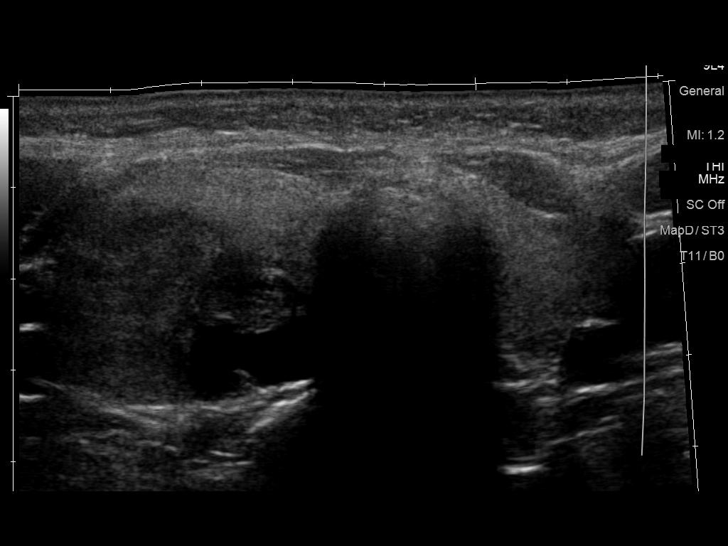
[im 4/39]
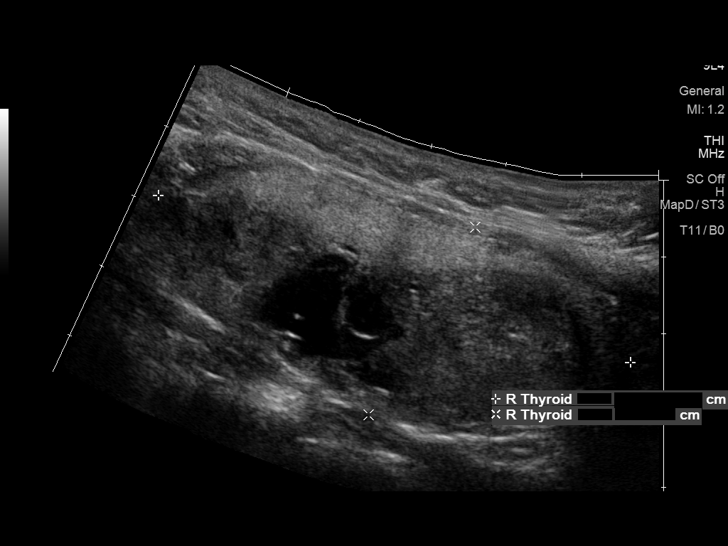
[im 7/39]
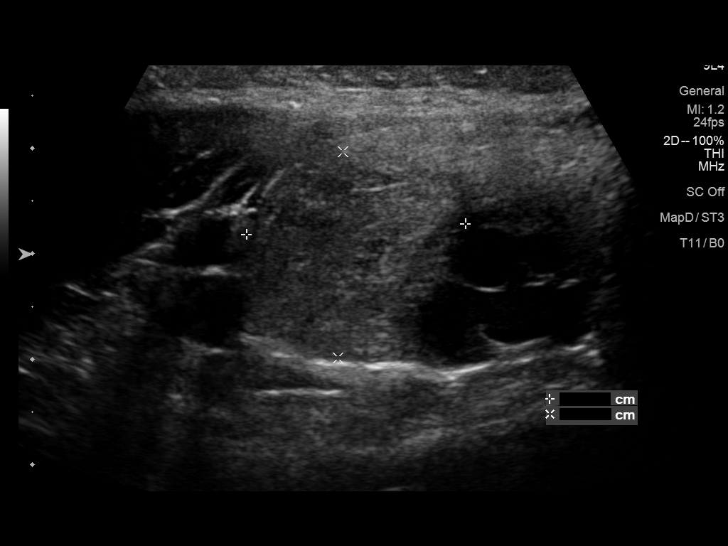
[im 10/39]
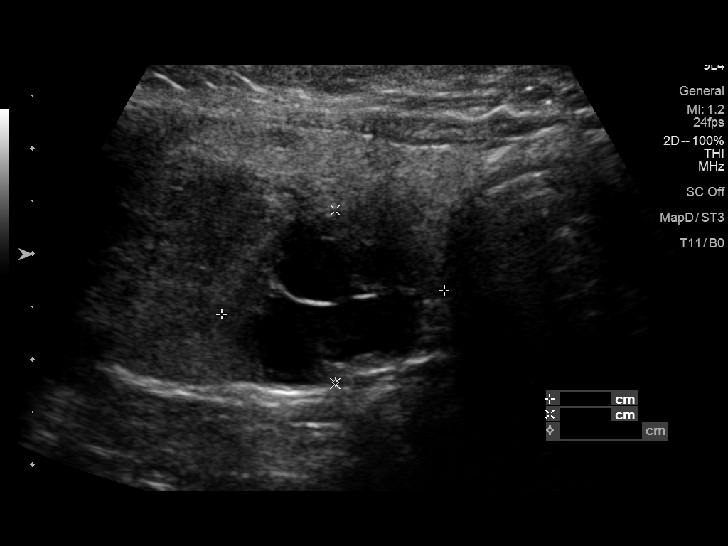
[im 13/39]
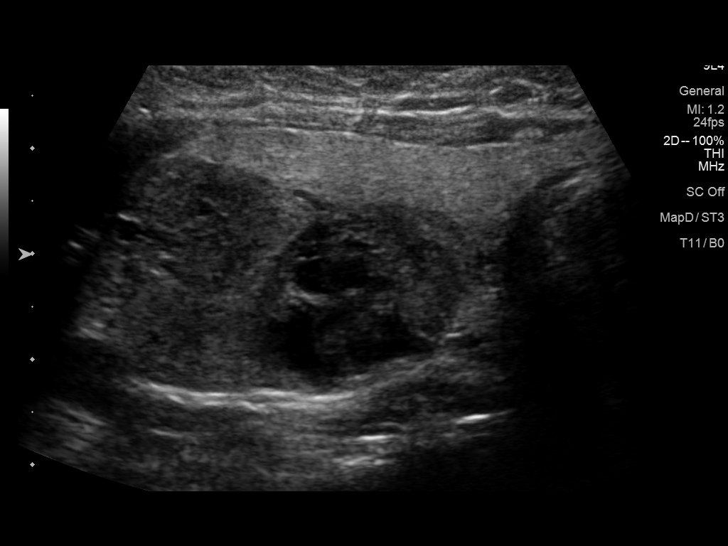
[im 16/39]
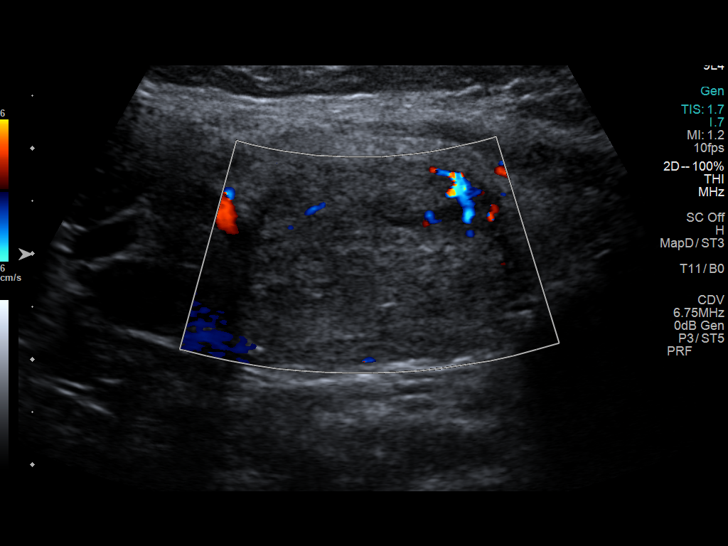
[im 20/39]
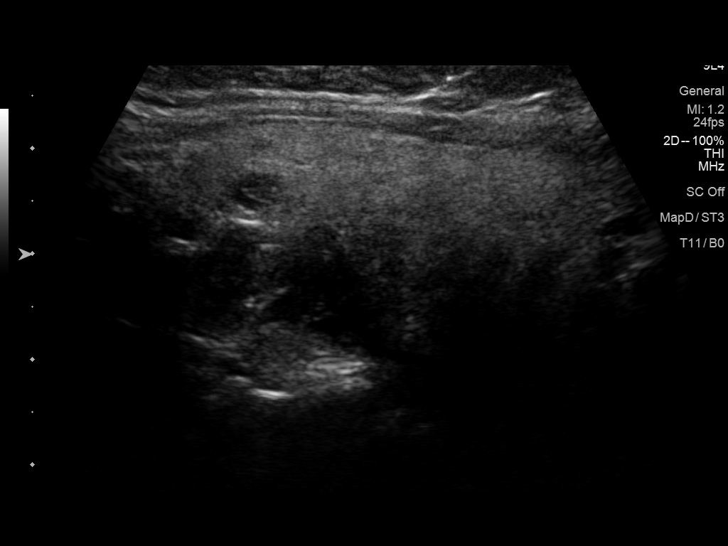
[im 23/39]
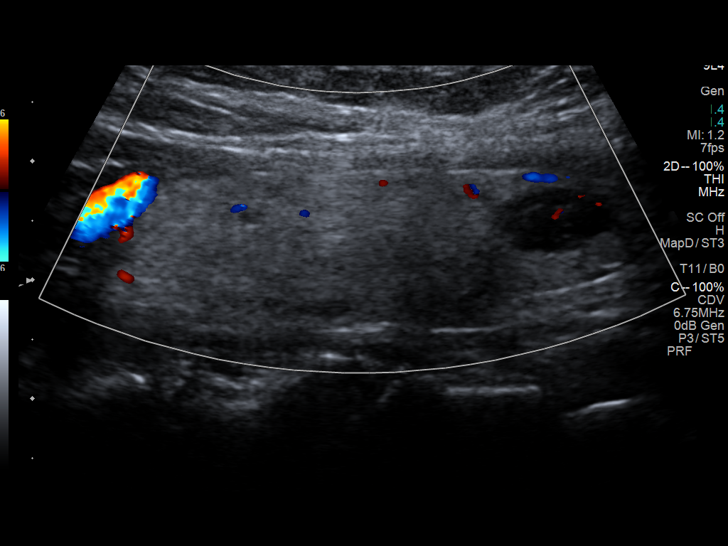
[im 26/39]
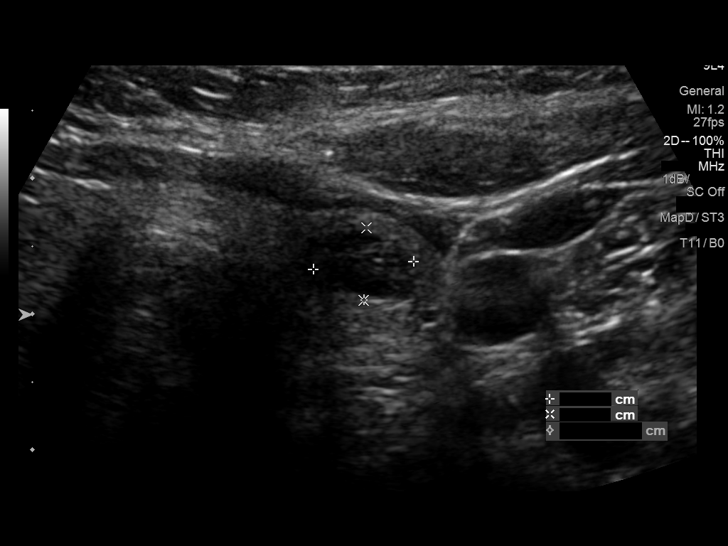
[im 29/39]
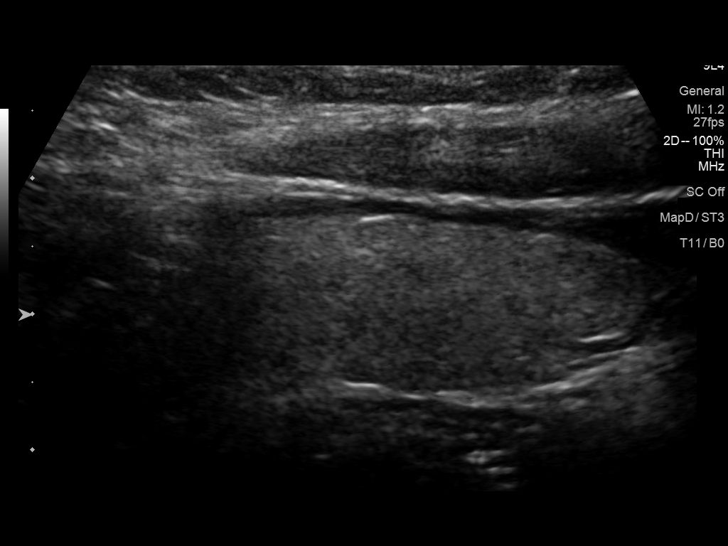
[im 32/39]
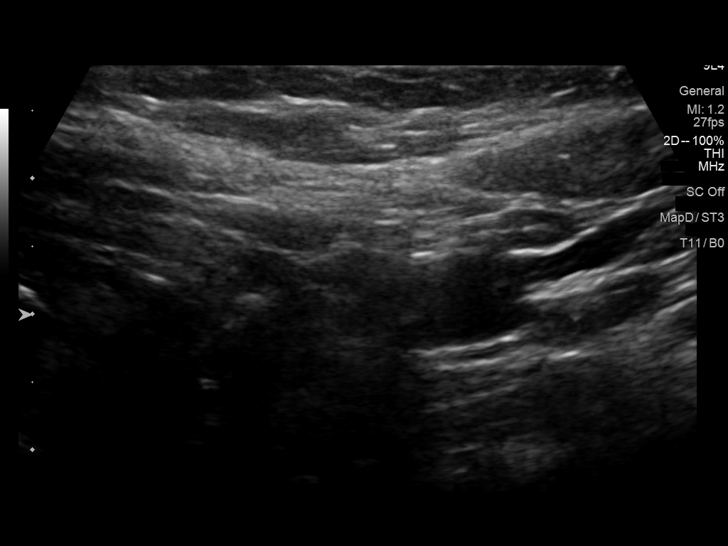
[im 35/39]
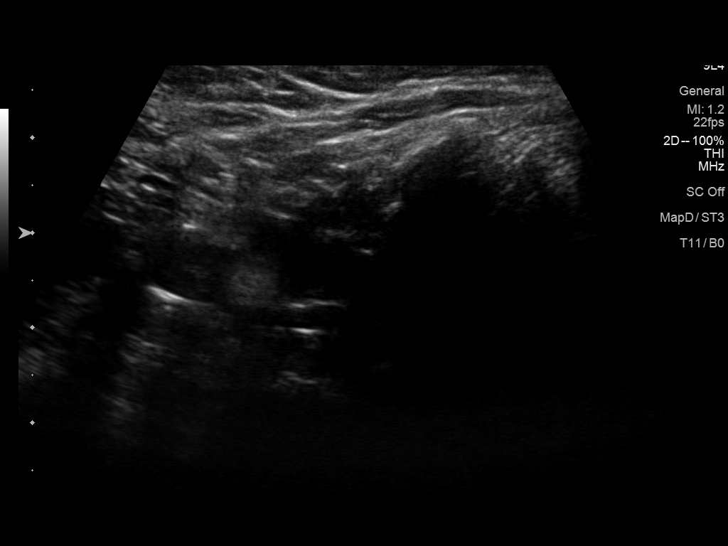
[im 39/39]
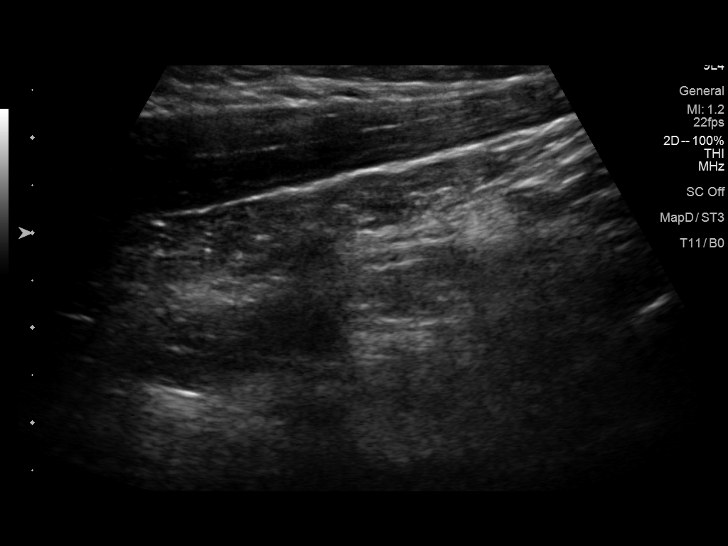

[13 of 25 positions shown; findings below may reference images not displayed]

FINDINGS: Parenchymal Echotexture: Mildly heterogeneous

Isthmus: 0.4 cm

Right lobe: 6.5 x 2.8 x 3.2 cm

Left lobe: 4.8 x 1.2 x 1.6 cm

_________________________________________________________

Estimated total number of nodules >/= 1 cm: 3

Number of spongiform nodules >/=  2 cm not described below (TR1): 0

Number of mixed cystic and solid nodules >/= 1.5 cm not described
below (TR2): 0

_________________________________________________________

Nodule # 1:

Location: Right; superior

Maximum size: 3.9 cm; Other 2 dimensions: 2.1 x 2.0 cm

Composition: solid/almost completely solid (2)

Echogenicity: isoechoic (1)

Shape: not taller-than-wide (0)

Margins: ill-defined (0)

Echogenic foci: none (0)

ACR TI-RADS total points: 3.

ACR TI-RADS risk category: TR3 (3 points).

ACR TI-RADS recommendations:

**Given size (>/= 2.5 cm) and appearance, fine needle aspiration of
this mildly suspicious nodule should be considered based on TI-RADS
criteria.

_________________________________________________________

Nodule # 2: 2.3 x 2.1 x 1.6 cm cystic nodule in the mid right
thyroid lobe does not meet criteria for FNA or imaging follow-up.

_________________________________________________________

Nodule # 3:

Location: Right; inferior

Maximum size: 2.6 cm; Other 2 dimensions: 2.5 x 2.0 cm

Composition: solid/almost completely solid (2)

Echogenicity: hypoechoic (2)

Shape: not taller-than-wide (0)

Margins: ill-defined (0)

Echogenic foci: none (0)

ACR TI-RADS total points: 4.

ACR TI-RADS risk category: TR4 (4-6 points).

ACR TI-RADS recommendations:

**Given size (>/= 1.5 cm) and appearance, fine needle aspiration of
this moderately suspicious nodule should be considered based on
TI-RADS criteria.

_________________________________________________________

Nodule # 4:

Location: Left; Inferior

Maximum size: 0.9 cm; Other 2 dimensions: 0.7 x 0.5 cm

Composition: solid/almost completely solid (2) (possibly spongiform)

Echogenicity: hypoechoic (2)

Shape: not taller-than-wide (0)

Margins: smooth (0)

Echogenic foci: none (0)

ACR TI-RADS total points: 4.

ACR TI-RADS risk category: TR4 (4-6 points).

ACR TI-RADS recommendations:

Given size (<0.9 cm) and appearance, this nodule does NOT meet
TI-RADS criteria for biopsy or dedicated follow-up.

_________________________________________________________
IMPRESSION: Nodules 1 and 3 of the right thyroid lobe meets criteria for FNA.

The above is in keeping with the ACR TI-RADS recommendations - [HOSPITAL] 0236;[DATE].

## 2022-07-10 ENCOUNTER — Ambulatory Visit: Payer: Federal, State, Local not specified - PPO | Admitting: Orthopedic Surgery

## 2022-07-17 ENCOUNTER — Encounter: Payer: Self-pay | Admitting: Surgical

## 2022-07-17 ENCOUNTER — Ambulatory Visit: Payer: Federal, State, Local not specified - PPO | Admitting: Surgical

## 2022-07-17 DIAGNOSIS — M238X2 Other internal derangements of left knee: Secondary | ICD-10-CM | POA: Diagnosis not present

## 2022-07-17 MED ORDER — BUPIVACAINE HCL 0.25 % IJ SOLN
4.0000 mL | INTRAMUSCULAR | Status: AC | PRN
  Administered 2022-07-17: 4 mL via INTRA_ARTICULAR

## 2022-07-17 MED ORDER — METHYLPREDNISOLONE ACETATE 40 MG/ML IJ SUSP
40.0000 mg | INTRAMUSCULAR | Status: AC | PRN
  Administered 2022-07-17: 40 mg via INTRA_ARTICULAR

## 2022-07-17 MED ORDER — LIDOCAINE HCL 1 % IJ SOLN
5.0000 mL | INTRAMUSCULAR | Status: AC | PRN
  Administered 2022-07-17: 5 mL

## 2022-07-17 NOTE — Progress Notes (Signed)
Follow-up Office Visit Note   Patient: Michele [name]           Date of Birth: 1976/04/11           MRN: [id number] Visit Date: 07/17/2022 Requested by: Billie Ruddy, MD [place],  [place] [address] PCP: Billie Ruddy, MD  Subjective: Chief Complaint  Patient presents with  . Left Knee - Pain    HPI: Michele [name] is a 47 y.o. female who returns to the office for follow-up visit.    Plan at last visit was: Plan: Patient is a 47 year old female who presents for evaluation of left knee pain.  She returns with recurrent swelling of her left knee following aspiration injection at her last visit.  She has small to medium size chondral defect that was noted on MRI scan earlier this year in the spring.  We have discussed surgical intervention for this problem before and discussed that again today.  She also has history of right knee osteochondral allograft that is doing well and not really having any problems with the right knee.  She would like to avoid such a procedure on the left.  After discussing options, she would like to proceed with aspiration and injection of the left knee again today with return in March 2 repeat MRI and discuss surgical intervention at that time.  Need repeat MRI at that time in order to evaluate the size of the chondral defect at that point in time.  She understands that delaying surgery for another 6 months or so increases the likelihood that we will need to do an osteochondral allograft rather than just doing microfracture with bio cartilage through arthroscopy.  She would still like to proceed.  50 cc of inflammatory nonpurulent synovial fluid was aspirated from the left knee and cortisone injection administered.  Patient tolerated procedure well.  Follow-up with the office in March.    Since then, patient notes left knee is doing a little better.  Injection in November 2023 provided good relief for her.  She notes some popping after  she sits down and gets back up to stand.  She does note some stiffness in the knee at times.  She feels like the fluid has come back.  No new injury.              ROS: All systems reviewed are negative as they relate to the chief complaint within the history of present illness.  Patient denies fevers or chills.  Assessment & Plan: Visit Diagnoses:  1. 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 female who returns to the office for follow-up visit for left knee pain.  Plan from last visit was noted above in HPI.  They now return with continued left knee pain and return of left knee swelling.  She has full-thickness cartilage defect in the medial compartment with some more diffuse degenerative changes of the medial compartment cartilage noted on MRI from April 2023.  Once again we discussed the options available to patient.  At this point, we need to consider new MRI scan to further characterize the chondral defect she has as well as further evaluate the extent of the arthritis in the knee at this point in time.  The eventual surgical options for this knee would range from bio cartilage application versus osteochondral allograft versus partial/total knee arthroplasty based on the appearance on MRI scan.  After discussion, patient would like to proceed with aspiration injection today.  She is not in a appropriate point in her life to consider surgery and she would like to revisit this with visit in the fall.  Will plan on obtaining MRI of the left knee in mid August and half of follow-up after that to see the status of the medial compartment and the options available to her.  Patient understands that prolonging surgical intervention makes it more likely that she will need to have knee replacement at a young age.  She would still like to proceed.  Today in the clinic, 40 cc of nonpurulent synovial fluid was aspirated from the left knee.  Cortisone injection administered.   Tolerated procedure well without difficulty.  Follow-up in fall after MRI scan  Follow-Up Instructions: No follow-ups on file.   Orders:  Orders Placed This Encounter  Procedures  . MR Knee Left w/o contrast   No orders of the defined types were placed in this encounter.     Procedures: Large Joint Inj: L knee on 07/17/2022 8:51 PM Indications: diagnostic evaluation, joint swelling and pain Details: 18 G 1.5 in needle, superolateral approach  Arthrogram: No  Medications: 5 mL lidocaine 1 %; 40 mg methylPREDNISolone acetate 40 MG/ML; 4 mL bupivacaine 0.25 % Aspirate: 40 mL Outcome: tolerated well, no immediate complications Procedure, treatment alternatives, risks and benefits explained, specific risks discussed. Consent was given by the patient. Immediately prior to procedure a time out was called to verify the correct patient, procedure, equipment, support staff and site/side marked as required. Patient was prepped and draped in the usual sterile fashion.     Clinical Data: No additional findings.  Objective: Vital Signs: There were no vitals taken for this visit.  Physical Exam:  Constitutional: Patient appears well-developed HEENT:  Head: Normocephalic Eyes:EOM are normal Neck: Normal range of motion Cardiovascular: Normal rate Pulmonary/chest: Effort normal Neurologic: Patient is alert Skin: Skin is warm Psychiatric: Patient has normal mood and affect  Ortho Exam: Ortho exam demonstrates left knee with large effusion.  No cellulitis or skin changes noted.  She has tenderness primarily overlying the medial compartment on the medial joint line that is worse with the knee terminally flex.  No lateral joint line tenderness.  No calf tenderness.  Negative Homans' sign.  No pain with hip range of motion.  Excellent quad strength rated 5/5 without extensor lag.  She has range of motion from about 0 degrees extension to 120 degrees of knee flexion.  Specialty Comments:  No  specialty comments available.  Imaging: No results found.   PMFS History: Patient Active Problem List   Diagnosis Date Noted  . Osteochondritis dissecans of right knee   . Multinodular goiter 12/31/2020  . Fibroids 04/03/2020  . 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 Nonallopathic lesion of lumbar region 09/09/2019  . Nonallopathic lesion of sacral region 09/09/2019  . Nonallopathic lesion of thoracic region 09/09/2019  . Nonallopathic lesion of rib cage 09/09/2019  . Nonallopathic lesion of cervical region 09/09/2019  . Acute bursitis of left shoulder 09/09/2019  . Gynecomastia, female 02/03/2013  . Back pain, acute 06/22/2012  . Obesity 05/25/2010  . Essential hypertension 05/25/2010   Past Medical History:  Diagnosis Date  . Cholecystitis, acute with cholelithiasis 05/09/2016  . Diabetes mellitus without complication ([place])   . Hypertension    under control with med., has been on med. x 2 yr.  . Hypertrophy of breast 03/2013  . Obesity  Family History  Problem Relation Age of Onset  . Migraines Mother   . Arthritis Mother   . Deep vein thrombosis Mother        started after ortho surgery; lifetime  . Hyperlipidemia Mother   . Cancer Father 8       Prostate  . Heart disease Father        bypass  . Heart attack Father 30  . Hypertension Father   . Headache Brother   . Other Brother        ?something with heart; under eval  . Glaucoma Maternal Grandmother   . Head & neck cancer Maternal Grandmother   . Alzheimer's disease Maternal Grandfather   . Cancer Paternal Grandmother        blood?  . Cancer Paternal Grandfather        prostate  . Heart attack Paternal Uncle 55  . Thyroid disease Neg Hx     Past Surgical History:  Procedure Laterality Date  . ABDOMINAL HYSTERECTOMY N/A 04/13/2020   Procedure: HYSTERECTOMY ABDOMINAL;  Surgeon: Delsa Bern, MD;  Location: [facility];  Service: Gynecology;  Laterality: N/A;  AUTO INFUSION PROTOCOL  .  BILATERAL SALPINGECTOMY Bilateral 04/13/2020   Procedure: OPEN BILATERAL SALPINGECTOMY;  Surgeon: Delsa Bern, MD;  Location: [facility];  Service: Gynecology;  Laterality: Bilateral;  . BREAST REDUCTION SURGERY Bilateral 04/26/2013   Procedure: BILATERAL BREAST REDUCTION WITH LIPOSUCTION;  Surgeon: Cristine Polio, MD;  Location: [facility];  Service: Plastics;  Laterality: Bilateral;  . CHOLECYSTECTOMY N/A 05/10/2016   Procedure: LAPAROSCOPIC CHOLECYSTECTOMY WITH INTRAOPERATIVE CHOLANGIOGRAM;  Surgeon: Donnie Mesa, MD;  Location: [facility];  Service: General;  Laterality: N/A;  . FOOT SURGERY  2003; 2004   each foot - bunion, hammer toe  . OSTEOCHONDRAL DEFECT REPAIR/RECONSTRUCTION Right 05/14/2021   Procedure: right knee osteochondral open allograft fro medial femoral defect;  Surgeon: Meredith Pel, MD;  Location: [facility];  Service: Orthopedics;  Laterality: Right;   Social History   Occupational History  . Not on file  Tobacco Use  . Smoking status: Never  . Smokeless tobacco: Never  Vaping Use  . Vaping Use: Never used  Substance and Sexual Activity  . Alcohol use: Yes    Comment: rarely  . Drug use: No  . Sexual activity: Yes    Birth control/protection: I.U.D.

## 2022-07-23 DIAGNOSIS — E119 Type 2 diabetes mellitus without complications: Secondary | ICD-10-CM | POA: Diagnosis not present

## 2022-09-13 ENCOUNTER — Encounter: Payer: Self-pay | Admitting: Family Medicine

## 2022-09-13 ENCOUNTER — Ambulatory Visit (INDEPENDENT_AMBULATORY_CARE_PROVIDER_SITE_OTHER): Payer: Federal, State, Local not specified - PPO | Admitting: Family Medicine

## 2022-09-13 VITALS — BP 100/74 | HR 108 | Temp 98.5°F | Ht 65.0 in | Wt 230.0 lb

## 2022-09-13 DIAGNOSIS — E559 Vitamin D deficiency, unspecified: Secondary | ICD-10-CM

## 2022-09-13 DIAGNOSIS — R7303 Prediabetes: Secondary | ICD-10-CM

## 2022-09-13 DIAGNOSIS — Z1211 Encounter for screening for malignant neoplasm of colon: Secondary | ICD-10-CM | POA: Diagnosis not present

## 2022-09-13 DIAGNOSIS — Z6838 Body mass index (BMI) 38.0-38.9, adult: Secondary | ICD-10-CM | POA: Diagnosis not present

## 2022-09-13 DIAGNOSIS — I10 Essential (primary) hypertension: Secondary | ICD-10-CM | POA: Diagnosis not present

## 2022-09-13 DIAGNOSIS — Z0000 Encounter for general adult medical examination without abnormal findings: Secondary | ICD-10-CM

## 2022-09-13 LAB — CBC WITH DIFFERENTIAL/PLATELET
Basophils Absolute: 0 10*3/uL (ref 0.0–0.1)
Basophils Relative: 0.4 % (ref 0.0–3.0)
Eosinophils Absolute: 0.1 10*3/uL (ref 0.0–0.7)
Eosinophils Relative: 1.1 % (ref 0.0–5.0)
HCT: 38 % (ref 36.0–46.0)
Hemoglobin: 12.2 g/dL (ref 12.0–15.0)
Lymphocytes Relative: 19.9 % (ref 12.0–46.0)
Lymphs Abs: 1.9 10*3/uL (ref 0.7–4.0)
MCHC: 32.2 g/dL (ref 30.0–36.0)
MCV: 87.1 fl (ref 78.0–100.0)
Monocytes Absolute: 0.5 10*3/uL (ref 0.1–1.0)
Monocytes Relative: 5.6 % (ref 3.0–12.0)
Neutro Abs: 7 10*3/uL (ref 1.4–7.7)
Neutrophils Relative %: 73 % (ref 43.0–77.0)
Platelets: 324 10*3/uL (ref 150.0–400.0)
RBC: 4.37 Mil/uL (ref 3.87–5.11)
RDW: 14.4 % (ref 11.5–15.5)
WBC: 9.6 10*3/uL (ref 4.0–10.5)

## 2022-09-13 LAB — LIPID PANEL
Cholesterol: 156 mg/dL (ref 0–200)
HDL: 43.7 mg/dL (ref >39.00)
LDL Cholesterol: 99 mg/dL (ref 0–99)
NonHDL: 111.89
Total CHOL/HDL Ratio: 4
Triglycerides: 66 mg/dL (ref 0.0–149.0)
VLDL: 13.2 mg/dL (ref 0.0–40.0)

## 2022-09-13 LAB — COMPREHENSIVE METABOLIC PANEL
ALT: 17 U/L (ref 0–35)
AST: 18 U/L (ref 0–37)
Albumin: 4.1 g/dL (ref 3.5–5.2)
Alkaline Phosphatase: 92 U/L (ref 39–117)
BUN: 16 mg/dL (ref 6–23)
CO2: 28 mEq/L (ref 19–32)
Calcium: 9.2 mg/dL (ref 8.4–10.5)
Chloride: 101 mEq/L (ref 96–112)
Creatinine, Ser: 0.76 mg/dL (ref 0.40–1.20)
GFR: 94.57 mL/min (ref >60.00)
Glucose, Bld: 158 mg/dL — ABNORMAL HIGH (ref 70–99)
Potassium: 3.6 mEq/L (ref 3.5–5.1)
Sodium: 138 mEq/L (ref 135–145)
Total Bilirubin: 0.6 mg/dL (ref 0.2–1.2)
Total Protein: 7.2 g/dL (ref 6.0–8.3)

## 2022-09-13 LAB — HEMOGLOBIN A1C: Hgb A1c MFr Bld: 7.3 % — ABNORMAL HIGH (ref 4.6–6.5)

## 2022-09-13 LAB — TSH: TSH: 1.01 u[IU]/mL (ref 0.35–5.50)

## 2022-09-13 LAB — VITAMIN D 25 HYDROXY (VIT D DEFICIENCY, FRACTURES): VITD: 55.5 ng/mL (ref 30.00–100.00)

## 2022-09-13 LAB — T4, FREE: Free T4: 0.95 ng/dL (ref 0.60–1.60)

## 2022-09-13 NOTE — Progress Notes (Signed)
Established Patient Office Visit   Subjective  Patient ID: Michele [name], female    DOB: [birth year]  Age: 47 y.o. MRN: [id number]  Chief Complaint  Patient presents with   Annual Exam    Patient is a 47 year old female with pmh sig for HTN, pre-DM, h/o nontoxic multinodular goiter, history of vitamin D deficiency who was seen for CPE.  Patient states she has been doing well.  Seen by Ortho for left knee edema.  May have to have arthroscopy.  Taking lisinopril-hydrochlorothiazide for BP.  Mammogram planned for September during follow-up with OB/GYN.  Patient without concerns    Past Medical History:  Diagnosis Date   Cholecystitis, acute with cholelithiasis 05/09/2016   Diabetes mellitus without complication ([facility])    Hypertension    under control with med., has been on med. x 2 yr.   Hypertrophy of breast 03/2013   Obesity    Past Surgical History:  Procedure Laterality Date   ABDOMINAL HYSTERECTOMY N/A 04/13/2020   Procedure: HYSTERECTOMY ABDOMINAL;  Surgeon: Silverio Lay, MD;  Location[facility];  Service: Gynecology;  Laterality: N/A;  AUTO INFUSION PROTOCOL   BILATERAL SALPINGECTOMY Bilateral 04/13/2020   Procedure: OPEN BILATERAL SALPINGECTOMY;  Surgeon: Silverio Lay, MD;  Location: [facility];  Service: Gynecology;  Laterality: Bilateral;   BREAST REDUCTION SURGERY Bilateral 04/26/2013   Procedure: BILATERAL BREAST REDUCTION WITH LIPOSUCTION;  Surgeon: Louisa Second, MD;  Location: [facility];  Service: Plastics;  Laterality: Bilateral;   CHOLECYSTECTOMY N/A 05/10/2016   Procedure: LAPAROSCOPIC CHOLECYSTECTOMY WITH INTRAOPERATIVE CHOLANGIOGRAM;  Surgeon: Manus Rudd, MD;  Location: [facility];  Service: General;  Laterality: N/A;   FOOT SURGERY  2003; 2004   each foot - bunion, hammer toe   OSTEOCHONDRAL DEFECT REPAIR/RECONSTRUCTION Right 05/14/2021   Procedure: right knee osteochondral open allograft fro medial femoral defect;  Surgeon: Cammy Copa,  MD;  [facility];  Service: Orthopedics;  Laterality: Right;   Social History   Tobacco Use   Smoking status: Never   Smokeless tobacco: Never  Vaping Use   Vaping Use: Never used  Substance Use Topics   Alcohol use: Yes    Comment: rarely   Drug use: No   Family History  Problem Relation Age of Onset   Migraines Mother    Arthritis Mother    Deep vein thrombosis Mother        started after ortho surgery; lifetime   Hyperlipidemia Mother    Cancer Father 61       Prostate   Heart disease Father        bypass   Heart attack Father 73   Hypertension Father    Headache Brother    Other Brother        ?something with heart; under eval   Glaucoma Maternal Grandmother    Head & neck cancer Maternal Grandmother    Alzheimer's disease Maternal Grandfather    Cancer Paternal Grandmother        blood?   Cancer Paternal Grandfather        prostate   Heart attack Paternal Uncle 67   Thyroid disease Neg Hx    No Known Allergies    ROS Negative unless stated above    Objective:     BP 100/74 (BP Location: Left Arm, Patient Position: Sitting, Cuff Size: Large)   Pulse (!) 108   Temp 98.5 F (36.9 C) (Oral)   Ht 5\' 5"  (1.651 m)   Wt 230  lb (104.3 kg)   SpO2 98%   BMI 38.27 kg/m  BP Readings from Last 3 Encounters:  09/13/22 100/74  05/15/22 118/82  09/10/21 102/62   Wt Readings from Last 3 Encounters:  09/13/22 230 lb (104.3 kg)  05/15/22 233 lb 9.6 oz (106 kg)  09/10/21 232 lb 3.2 oz (105.3 kg)      Physical Exam Constitutional:      Appearance: Normal appearance.  HENT:     Head: Normocephalic and atraumatic.     Right Ear: Tympanic membrane, ear canal and external ear normal.     Left Ear: Tympanic membrane, ear canal and external ear normal.     Nose: Nose normal.     Mouth/Throat:     Mouth: Mucous membranes are moist.     Pharynx: No oropharyngeal exudate or posterior oropharyngeal erythema.  Eyes:     General: No  scleral icterus.    Extraocular Movements: Extraocular movements intact.     Conjunctiva/sclera: Conjunctivae normal.     Pupils: Pupils are equal, round, and reactive to light.  Neck:     Thyroid: No thyromegaly.  Cardiovascular:     Rate and Rhythm: Normal rate and regular rhythm.     Pulses: Normal pulses.     Heart sounds: Normal heart sounds. No murmur heard.    No friction rub.  Pulmonary:     Effort: Pulmonary effort is normal.     Breath sounds: Normal breath sounds. No wheezing, rhonchi or rales.  Abdominal:     General: Bowel sounds are normal.     Palpations: Abdomen is soft.     Tenderness: There is no abdominal tenderness.  Musculoskeletal:        General: No deformity. Normal range of motion.  Lymphadenopathy:     Cervical: No cervical adenopathy.  Skin:    General: Skin is warm and dry.     Findings: No lesion.  Neurological:     General: No focal deficit present.     Mental Status: She is alert and oriented to person, place, and time.  Psychiatric:        Mood and Affect: Mood normal.        Thought Content: Thought content normal.      No results found for any visits on 09/13/22.    Assessment & Plan:  Preventative health care -Age-appropriate screenings discussed -Referral placed for colonoscopy -Mammogram up-to-date, done 01/02/2022.  Scheduled for September 2024 -Pap with OB/GYN -Immunizations reviewed -Neck CPE in 1 year -     CBC with Differential/Platelet -     Lipid panel -     TSH -     Hemoglobin A1c  Essential hypertension -Well-controlled -Monitor BP at home.  For hypotension discussed decreasing medication. -Continue lisinopril-hydrochlorothiazide 20-12.5 mg daily -Continue lifestyle modifications -     Comprehensive metabolic panel -     Lipid panel -     TSH -     T4, free  Prediabetes -Hemoglobin A1c 6.1% on 07/13/2021 -     Lipid panel -     Hemoglobin A1c  Screening for colon cancer -     Ambulatory referral to  Gastroenterology  Class 2 severe obesity with serious comorbidity and body mass index (BMI) of 38.0 to 38.9 in adult, unspecified obesity type (HCC) -Body mass index is 38.27 kg/m. -Continue lifestyle modifications -     Comprehensive metabolic panel -     Lipid panel -     TSH -  Hemoglobin A1c -     T4, free  Vitamin D deficiency -     VITAMIN D 25 Hydroxy (Vit-D Deficiency, Fractures)   Return in about 6 months (around 03/16/2023), or if symptoms worsen or fail to improve.   Deeann Saint, MD

## 2022-09-13 NOTE — Addendum Note (Signed)
Addended by: Abbe Amsterdam R on: 09/13/2022 09:42 AM   Modules accepted: Level of Service

## 2022-10-07 ENCOUNTER — Encounter: Payer: Self-pay | Admitting: Internal Medicine

## 2022-10-25 ENCOUNTER — Other Ambulatory Visit: Payer: Self-pay

## 2022-10-25 ENCOUNTER — Ambulatory Visit (AMBULATORY_SURGERY_CENTER): Payer: Federal, State, Local not specified - PPO

## 2022-10-25 VITALS — Ht 65.0 in | Wt 230.0 lb

## 2022-10-25 DIAGNOSIS — Z1211 Encounter for screening for malignant neoplasm of colon: Secondary | ICD-10-CM

## 2022-10-25 MED ORDER — NA SULFATE-K SULFATE-MG SULF 17.5-3.13-1.6 GM/177ML PO SOLN: 1.0000 | Freq: Once | ORAL | 0 refills | Status: AC

## 2022-10-25 NOTE — Progress Notes (Signed)
Denies allergies to eggs or soy products. Denies complication of anesthesia or sedation. Denies use of weight loss medication. Denies use of O2.   Emmi instructions given for colonoscopy.  

## 2022-11-14 ENCOUNTER — Encounter: Payer: Self-pay | Admitting: Internal Medicine

## 2022-11-14 ENCOUNTER — Ambulatory Visit (AMBULATORY_SURGERY_CENTER): Payer: Federal, State, Local not specified - PPO | Admitting: Internal Medicine

## 2022-11-14 VITALS — BP 117/82 | HR 67 | Temp 97.5°F | Resp 14 | Ht 65.0 in | Wt 230.0 lb

## 2022-11-14 DIAGNOSIS — D128 Benign neoplasm of rectum: Secondary | ICD-10-CM | POA: Diagnosis not present

## 2022-11-14 DIAGNOSIS — D123 Benign neoplasm of transverse colon: Secondary | ICD-10-CM | POA: Diagnosis not present

## 2022-11-14 DIAGNOSIS — D122 Benign neoplasm of ascending colon: Secondary | ICD-10-CM

## 2022-11-14 DIAGNOSIS — Z1211 Encounter for screening for malignant neoplasm of colon: Secondary | ICD-10-CM | POA: Diagnosis not present

## 2022-11-14 DIAGNOSIS — K635 Polyp of colon: Secondary | ICD-10-CM | POA: Diagnosis not present

## 2022-11-14 MED ORDER — SODIUM CHLORIDE 0.9 % IV SOLN: 500.0000 mL | Freq: Once | INTRAVENOUS | Status: DC

## 2022-11-14 NOTE — Progress Notes (Signed)
Pt's states no medical or surgical changes since previsit or office visit. 

## 2022-11-14 NOTE — Patient Instructions (Addendum)
Resume previous diet Continue present medications Await pathology results Handouts/information given for polyps and hemorrhoids  YOU HAD AN ENDOSCOPIC PROCEDURE TODAY AT THE [facility]:   Refer to the procedure report that was given to you for any specific questions about what was found during the examination.  If the procedure report does not answer your questions, please call your gastroenterologist to clarify.  If you requested that your care partner not be given the details of your procedure findings, then the procedure report has been included in a sealed envelope for you to review at your convenience later.  YOU SHOULD EXPECT: Some feelings of bloating in the abdomen. Passage of more gas than usual.  Walking can help get rid of the air that was put into your GI tract during the procedure and reduce the bloating. If you had a lower endoscopy (such as a colonoscopy or flexible sigmoidoscopy) you may notice spotting of blood in your stool or on the toilet paper. If you underwent a bowel prep for your procedure, you may not have a normal bowel movement for a few days.  Please Note:  You might notice some irritation and congestion in your nose or some drainage.  This is from the oxygen used during your procedure.  There is no need for concern and it should clear up in a day or so.  SYMPTOMS TO REPORT IMMEDIATELY:  Following lower endoscopy (colonoscopy):  Excessive amounts of blood in the stool  Significant tenderness or worsening of abdominal pains  Swelling of the abdomen that is new, acute  Fever of 100F or higher  For urgent or emergent issues, a gastroenterologist can be reached at any hour by calling [phone]. Do not use MyChart messaging for urgent concerns.   DIET:  We do recommend a small meal at first, but then you may proceed to your regular diet.  Drink plenty of fluids but you should avoid alcoholic beverages for 24 hours.  ACTIVITY:  You should plan to take  it easy for the rest of today and you should NOT DRIVE or use heavy machinery until tomorrow (because of the sedation medicines used during the test).    FOLLOW UP: Our staff will call the number listed on your records the next business day following your procedure.  We will call around 7:15- 8:00 am to check on you and address any questions or concerns that you may have regarding the information given to you following your procedure. If we do not reach you, we will leave a message.     If any biopsies were taken you will be contacted by phone or by letter within the next 1-3 weeks.  Please call us at [phone] if you have not heard about the biopsies in 3 weeks.    SIGNATURES/CONFIDENTIALITY: You and/or your care partner have signed paperwork which will be entered into your electronic medical record.  These signatures attest to the fact that that the information above on your After Visit Summary has been reviewed and is understood.  Full responsibility of the confidentiality of this discharge information lies with you and/or your care-partner. 

## 2022-11-14 NOTE — Op Note (Signed)
[facility] Patient Name: Michele [name] Procedure Date: 11/14/2022 10:56 AM MRN: [id number] Endoscopist: Particia Lather , , [id number] Age: 47 Referring MD:  Date of Birth: 1975-07-13 Gender: Female Account #: [id number] Procedure:                Colonoscopy Indications:              Screening for colorectal malignant neoplasm, This                            is the patient's first colonoscopy Medicines:                Monitored Anesthesia Care Procedure:                Pre-Anesthesia Assessment:                           - Prior to the procedure, a History and Physical                            was performed, and patient medications and                            allergies were reviewed. [name] tolerance of                            previous anesthesia was also reviewed. The risks                            and benefits of the procedure and the sedation                            options and risks were discussed with the patient.                            All questions were answered, and informed consent                            was obtained. Prior Anticoagulants: The patient has                            taken no anticoagulant or antiplatelet agents. ASA                            Grade Assessment: II - A patient with mild systemic                            disease. After reviewing the risks and benefits,                            the patient was deemed in satisfactory condition to                            undergo the procedure.  After obtaining informed consent, the colonoscope                            was passed under direct vision. Throughout the                            procedure, the patient's blood pressure, pulse, and                            oxygen saturations were monitored continuously. The                            CF [id number] #[id number] was introduced through the anus                            and advanced  to the the terminal ileum. The                            colonoscopy was performed without difficulty. The                            patient tolerated the procedure well. The quality                            of the bowel preparation was excellent. The                            terminal ileum, ileocecal valve, appendiceal                            orifice, and rectum were photographed. Scope In: 11:04:43 AM Scope Out: 11:25:25 AM Scope Withdrawal Time: 0 hours 18 minutes 36 seconds  Total Procedure Duration: 0 hours 20 minutes 42 seconds  Findings:                 The terminal ileum appeared normal.                           Two sessile polyps were found in the transverse                            colon and ascending colon. The polyps were 3 to 5                            mm in size. These polyps were removed with a cold                            snare. Resection and retrieval were complete.                           A 20 mm polyp was found in the rectum. The polyp                            was sessile.  Preparations were made for mucosal                            resection. Saline was injected to raise the lesion.                            Hot snare mucosal resection was performed.                            Resection and retrieval were complete. Area distal                            to and on the opposite wall of the polyp was                            tattooed with an injection of Spot (carbon black).                           Non-bleeding internal hemorrhoids were found during                            retroflexion. Complications:            No immediate complications. Estimated Blood Loss:     Estimated blood loss was minimal. Impression:               - The examined portion of the ileum was normal.                           - Two 3 to 5 mm polyps in the transverse colon and                            in the ascending colon, removed with a cold snare.                             Resected and retrieved.                           - One 20 mm polyp in the rectum, removed with                            mucosal resection. Resected and retrieved. Tattooed.                           - Non-bleeding internal hemorrhoids.                           - Mucosal resection was performed. Resection and                            retrieval were complete. Recommendation:           - Discharge patient to home (with escort).                           -  Await pathology results.                           - The findings and recommendations were discussed                            with the patient. Dr Particia Lather "Alan Ripper" Leonides Schanz,  11/14/2022 11:30:14 AM

## 2022-11-14 NOTE — Progress Notes (Signed)
GASTROENTEROLOGY PROCEDURE H&P NOTE   Primary Care Physician: Deeann Saint, MD    Reason for Procedure:   Colon cancer screening  Plan:    Colonoscopy  Patient is appropriate for endoscopic procedure(s) in the ambulatory ([facility]) setting.  The nature of the procedure, as well as the risks, benefits, and alternatives were carefully and thoroughly reviewed with the patient. Ample time for discussion and questions allowed. The patient understood, was satisfied, and agreed to proceed.     HPI: Michele [name] is a 47 y.o. female who presents for colonoscopy for colon cancer screening. Denies blood in stools, changes in bowel habits, or unintentional weight loss. Denies family history of colon cancer.  Past Medical History:  Diagnosis Date   Allergy    Blood transfusion without reported diagnosis    Cholecystitis, acute with cholelithiasis 05/09/2016   Diabetes mellitus without complication (HCC)    Hypertension    under control with med., has been on med. x 2 yr.   Hypertrophy of breast 03/2013   Obesity     Past Surgical History:  Procedure Laterality Date   ABDOMINAL HYSTERECTOMY N/A 04/13/2020   Procedure: HYSTERECTOMY ABDOMINAL;  Surgeon: Silverio Lay, MD;  Location: [facility] OR;  Service: Gynecology;  Laterality: N/A;  AUTO INFUSION PROTOCOL   BILATERAL SALPINGECTOMY Bilateral 04/13/2020   Procedure: OPEN BILATERAL SALPINGECTOMY;  Surgeon: Silverio Lay, MD;  Location: [facility] OR;  Service: Gynecology;  Laterality: Bilateral;   BREAST REDUCTION SURGERY Bilateral 04/26/2013   Procedure: BILATERAL BREAST REDUCTION WITH LIPOSUCTION;  Surgeon: Louisa Second, MD;  Location: [facility];  Service: Plastics;  Laterality: Bilateral;   CHOLECYSTECTOMY N/A 05/10/2016   Procedure: LAPAROSCOPIC CHOLECYSTECTOMY WITH INTRAOPERATIVE CHOLANGIOGRAM;  Surgeon: Manus Rudd, MD;  Location: [facility] OR;  Service: General;  Laterality: N/A;   FOOT SURGERY  2003; 2004   each foot -  bunion, hammer toe   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 Authorizing Provider  blood glucose meter kit and supplies KIT Dispense based on patient and insurance preference. Use up to four times daily as directed. (FOR ICD-9 250.00, 250.01). 02/25/19  Yes Koberlein, Paris Lore, MD  Cholecalciferol (VITAMIN D) 50 MCG (2000 UT) CAPS Take 1 capsule (2,000 Units total) by mouth daily. 03/08/20  Yes Koberlein, Junell C, MD  ELDERBERRY PO Take 50 mg by mouth daily.   Yes [provider]  lisinopril-hydrochlorothiazide (ZESTORETIC) 20-12.5 MG tablet Take 1 tablet by mouth daily. 05/15/22  Yes Deeann Saint, MD  Multiple Vitamins-Minerals (MULTIVITAMIN ADULTS PO) Take by mouth daily.   Yes [provider]    Current Outpatient Medications  Medication Sig Dispense Refill   blood glucose meter kit and supplies KIT Dispense based on patient and insurance preference. Use up to four times daily as directed. (FOR ICD-9 250.00, 250.01). 1 each 0   Cholecalciferol (VITAMIN D) 50 MCG (2000 UT) CAPS Take 1 capsule (2,000 Units total) by mouth daily. 30 capsule    ELDERBERRY PO Take 50 mg by mouth daily.     lisinopril-hydrochlorothiazide (ZESTORETIC) 20-12.5 MG tablet Take 1 tablet by mouth daily. 90 tablet 3   Multiple Vitamins-Minerals (MULTIVITAMIN ADULTS PO) Take by mouth daily.     Current Facility-Administered Medications  Medication  Dose Route Frequency Provider Last Rate Last Admin   0.9 %  sodium chloride infusion  500 mL Intravenous Once Imogene Burn, MD        Allergies as of 11/14/2022   (No Known Allergies)    Family History  Problem Relation Age of Onset   Migraines Mother     Arthritis Mother    Deep vein thrombosis Mother        started after ortho surgery; lifetime   Hyperlipidemia Mother    Cancer Father 65       Prostate   Heart disease Father        bypass   Heart attack Father 45   Hypertension Father    Headache Brother    Other Brother        ?something with heart; under eval   Heart attack Paternal Uncle 72   Glaucoma Maternal Grandmother    Head & neck cancer Maternal Grandmother    Alzheimer's disease Maternal Grandfather    Cancer Paternal Grandmother        blood?   Cancer Paternal Grandfather        prostate   Thyroid disease Neg Hx    Esophageal cancer Neg Hx    Rectal cancer Neg Hx    Stomach cancer Neg Hx     Social History   Socioeconomic History   Marital status: Married    Spouse name: Not on file   Number of children: Not on file   Years of education: Not on file   Highest education level: Not on file  Occupational History   Not on file  Tobacco Use   Smoking status: Never   Smokeless tobacco: Never  Vaping Use   Vaping status: Never Used  Substance and Sexual Activity   Alcohol use: Yes    Comment: rarely   Drug use: No   Sexual activity: Yes    Birth control/protection: I.U.D.  Other Topics Concern   Not on file  Social History Narrative   Not on file   Social Determinants of Health   Financial Resource Strain: Low Risk  (09/13/2022)   Overall Financial Resource Strain (CARDIA)    Difficulty of Paying Living Expenses: Not hard at all  Food Insecurity: No Food Insecurity (09/13/2022)   Hunger Vital Sign    Worried About Running Out of Food in the Last Year: Never true    Ran Out of Food in the Last Year: Never true  Transportation Needs: No Transportation Needs (09/13/2022)   PRAPARE - Administrator, Civil Service (Medical): No    Lack of Transportation (Non-Medical): No  Physical Activity: Sufficiently Active (09/13/2022)   Exercise Vital Sign    Days of Exercise per Week: 3 days     Minutes of Exercise per Session: 60 min  Stress: No Stress Concern Present (09/13/2022)   [employer] of Occupational Health - Occupational Stress Questionnaire    Feeling of Stress : Not at all  Social Connections: Socially Integrated (09/13/2022)   Social Connection and Isolation Panel [NHANES]    Frequency of Communication with Friends and Family: More than three times a week    Frequency of Social Gatherings with Friends and Family: Once a week    Attends Religious Services: More than 4 times per year    Active Member of [employer] or Organizations: Yes    Attends Engineer, structural: More than 4 times per year    Marital Status: Married  Intimate Partner Violence: Not At Risk (09/13/2022)   Humiliation, Afraid, Rape, and Kick questionnaire    Fear of Current or Ex-Partner: No    Emotionally Abused: No    Physically Abused: No    Sexually Abused: No    Physical Exam: Vital signs in last 24 hours: BP 110/72   Pulse 83   Temp (!) 97.5 F (36.4 C)   Ht 5\' 5"  (1.651 m)   Wt 230 lb (104.3 kg)   SpO2 100%   BMI 38.27 kg/m  GEN: NAD EYE: Sclerae anicteric ENT: MMM CV: Non-tachycardic Pulm: No increased work of breathing GI: Soft, NT/ND NEURO:  Alert & Oriented   Eulah Pont, MD [facility]  11/14/2022 11:00 AM

## 2022-11-14 NOTE — Progress Notes (Signed)
 Uneventful propofol anesthetic. Report to pacu rn. Vss. Care resumed by rn.

## 2022-11-14 NOTE — Progress Notes (Signed)
Called to room to assist during endoscopic procedure.  Patient ID and intended procedure confirmed with present staff. Received instructions for my participation in the procedure from the performing physician.  

## 2022-11-15 ENCOUNTER — Telehealth: Payer: Self-pay

## 2022-11-15 NOTE — Telephone Encounter (Signed)
  Follow up Call-     11/14/2022   10:43 AM  Call back number  Post procedure Call Back phone  # [phone]  Permission to leave phone message No     Patient questions:  Do you have a fever, pain , or abdominal swelling? No. Pain Score  0 *  Have you tolerated food without any problems? Yes.    Have you been able to return to your normal activities? Yes.    Do you have any questions about your discharge instructions: Diet   No. Medications  No. Follow up visit  No.  Do you have questions or concerns about your Care? No.  Actions: * If pain score is 4 or above: No action needed, pain <4.

## 2022-11-20 ENCOUNTER — Encounter: Payer: Self-pay | Admitting: Internal Medicine

## 2022-12-17 ENCOUNTER — Ambulatory Visit
Admission: RE | Admit: 2022-12-17 | Discharge: 2022-12-17 | Disposition: A | Discharge status: Home / Self Care | Payer: Federal, State, Local not specified - PPO | Source: Ambulatory Visit | Attending: Surgical | Admitting: Surgical

## 2022-12-17 DIAGNOSIS — M25562 Pain in left knee: Secondary | ICD-10-CM | POA: Diagnosis not present

## 2022-12-17 DIAGNOSIS — M25462 Effusion, left knee: Secondary | ICD-10-CM | POA: Diagnosis not present

## 2022-12-17 DIAGNOSIS — M23322 Other meniscus derangements, posterior horn of medial meniscus, left knee: Secondary | ICD-10-CM | POA: Diagnosis not present

## 2022-12-17 DIAGNOSIS — M238X2 Other internal derangements of left knee: Secondary | ICD-10-CM

## 2022-12-17 DIAGNOSIS — M7989 Other specified soft tissue disorders: Secondary | ICD-10-CM | POA: Diagnosis not present

## 2022-12-20 ENCOUNTER — Ambulatory Visit: Payer: Federal, State, Local not specified - PPO | Admitting: Surgical

## 2022-12-20 DIAGNOSIS — M238X2 Other internal derangements of left knee: Secondary | ICD-10-CM

## 2022-12-22 ENCOUNTER — Encounter: Payer: Self-pay | Admitting: Surgical

## 2022-12-22 NOTE — Progress Notes (Signed)
Office Visit Note   Patient: Michele [name]           Date of Birth: 06-14-1975           MRN: [id number] Visit Date: 12/20/2022 Requested by: Deeann Saint, MD [address] [place],  [place] [address] PCP: Deeann Saint, MD  Subjective: Chief Complaint  Patient presents with   Left Knee - Follow-up    HPI: Michele [name] is a 47 y.o. female who presents to the office for MRI review. Continues to complain mainly of occasional stiffness and discomfort in the left knee.  She had MRI but overall she feels her symptoms are actually more controlled than at her last visit.  She feels the knee is less swollen in general.  She has no mechanical symptoms or locking aside from occasional clicking sensation.  No radiation of pain up or down the leg.  No groin pain.  She is ambulatory without assistance.  MRI results revealed: MR Knee Left w/o contrast  Result Date: 12/20/2022 CLINICAL DATA:  Evaluate for chondral defect.  Pain and swelling. EXAM: MRI OF THE LEFT KNEE WITHOUT CONTRAST TECHNIQUE: Multiplanar, multisequence MR imaging of the knee was performed. No intravenous contrast was administered. COMPARISON:  MRI left knee 08/19/2021, left knee radiographs 08/10/2021 FINDINGS: MENISCI Medial meniscus: There is new linear increased proton density signal extending from the superior aspect of the posterior wall of the posterior horn of the medial meniscus and closely paralleling the superior articular surface of the meniscal triangle before mildly contacting the inferior articular surface of the central third of the meniscal triangle (sagittal series 8 images 8 through 11). This tear is also associated with degenerative fraying at the superior articular surface of the more medial aspect of the posterior horn (sagittal image 8). There is new horizontal linear intermediate proton density signal within the inferior aspect of the posterior segment of the body of the medial meniscus,  however this does not appear to extend through an articular surface (coronal series 10, image 11). Lateral meniscus: There is mild intermediate proton density signal intrasubstance degeneration within the anterior horn of the lateral meniscus. No tear is seen extending through an articular surface of the lateral meniscus. LIGAMENTS Cruciates: The ACL and PCL are intact. Collaterals: The medial collateral ligament is intact. The fibular collateral ligament, biceps femoris tendon, iliotibial band, and popliteus tendon are intact. CARTILAGE Patellofemoral: There is high-grade partial-thickness cartilage loss within the inferior aspect of the trochlear notch, similar to prior (sagittal image 14). Medial: There is full-thickness cartilage loss within the mid to posterior aspect of the weight-bearing medial femoral condyle and region measuring up to approximately 5 mm in transverse dimension and 11 mm in AP dimension, similar to prior. There is worsened moderate thinning of the more medial aspect of the weight-bearing medial femoral condyle cartilage and the mid to medial aspect of the medial tibial plateau cartilage. Lateral: There is mild thinning of the weight-bearing lateral femoral condyle lateral tibial plateau cartilage. Joint: Mild joint effusion, increased from prior. Normal Hoffa's fat pad. No plical thickening. Popliteal Fossa:  No Baker's cyst. Extensor Mechanism:  Intact quadriceps tendon and patellar tendon. Bones:  No acute fracture or dislocation. Other: None. IMPRESSION: Compared to 08/19/2021 1. New posterior horn of the medial meniscus tear as above. 2. Full-thickness cartilage loss within the mid to posterior aspect of the weight-bearing medial femoral condyle, similar to prior. There is worsened moderate thinning of the more medial aspect of  the weight-bearing medial femoral condyle cartilage and the mid to medial aspect of the medial tibial plateau cartilage. 3. High-grade partial-thickness cartilage  loss within the inferior aspect of the trochlear notch, similar to prior. 4. Mild thinning of the weight-bearing lateral femoral condyle and lateral tibial plateau cartilage. 5. Mild joint effusion, increased from prior. Electronically Signed   By: Neita Garnet M.D.   On: 12/20/2022 09:21                 ROS: All systems reviewed are negative as they relate to the chief complaint within the history of present illness.  Patient denies fevers or chills.  Assessment & Plan: Visit Diagnoses:  1. 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 female who presents to the office for review of left knee MRI scan.  MRI demonstrates medial meniscus tear as well as full-thickness cartilage loss at is mildly progressed in the medial femoral condyle.  We did discuss options available to patient such as living with her symptoms versus knee arthroscopy with allograft cartilage application versus aspiration/injection.  She understands surgical intervention is the best way to prevent further degenerative changes in the knee and best way to delay the need for knee replacement.  She will consider her options but her lifestyle taking care of 2 children is not optimal for her needing a period of nonweightbearing.  Currently her symptoms are overall controlled.  She will follow-up with the office as needed but reach out if she would like to pursue aspiration/injection or surgical intervention.  Follow-Up Instructions: No follow-ups on file.   Orders:  No orders of the defined types were placed in this encounter.  No orders of the defined types were placed in this encounter.     Procedures: No procedures performed   Clinical Data: No additional findings.  Objective: Vital Signs: There were no vitals taken for this visit.  Physical Exam:  Constitutional: Patient appears well-developed HEENT:  Head: Normocephalic Eyes:EOM are normal Neck: Normal range of  motion Cardiovascular: Normal rate Pulmonary/chest: Effort normal Neurologic: Patient is alert Skin: Skin is warm Psychiatric: Patient has normal mood and affect  Ortho Exam: Ortho exam demonstrates left knee with mild to moderate effusion.  No significant joint line tenderness over the medial or lateral joint lines.  No tenderness over the medial femoral condyle with the knee flexed terminally.  She has about 0 degrees extension and 120 degrees of knee flexion.  No calf tenderness.  Negative Homans' sign.  No pain with hip range of motion.  She is able to perform straight leg raise without extensor lag.  Specialty Comments:  No specialty comments available.  Imaging: No results found.   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   Nonallopathic lesion of lumbar region 09/09/2019   Nonallopathic lesion of sacral region 09/09/2019   Nonallopathic lesion of thoracic region 09/09/2019   Nonallopathic lesion of rib cage 09/09/2019   Nonallopathic lesion of cervical region 09/09/2019   Acute bursitis of left shoulder 09/09/2019   Gynecomastia, female 02/03/2013   Back pain, acute 06/22/2012   Obesity 05/25/2010   Essential hypertension 05/25/2010   Past Medical History:  Diagnosis Date   Allergy    Blood transfusion without reported diagnosis    Cholecystitis,  acute with cholelithiasis 05/09/2016   Diabetes mellitus without complication (HCC)    Hypertension    under control with med., has been on med. x 2 yr.   Hypertrophy of breast 03/2013   Obesity     Family History  Problem Relation Age of Onset   Migraines Mother    Arthritis Mother    Deep vein thrombosis Mother        started after ortho surgery; lifetime   Hyperlipidemia Mother    Cancer Father 61       Prostate   Heart disease Father         bypass   Heart attack Father 51   Hypertension Father    Headache Brother    Other Brother        ?something with heart; under eval   Heart attack Paternal Uncle 69   Glaucoma Maternal Grandmother    Head & neck cancer Maternal Grandmother    Alzheimer's disease Maternal Grandfather    Cancer Paternal Grandmother        blood?   Cancer Paternal Grandfather        prostate   Thyroid disease Neg Hx    Esophageal cancer Neg Hx    Rectal cancer Neg Hx    Stomach cancer Neg Hx     Past Surgical History:  Procedure Laterality Date   ABDOMINAL HYSTERECTOMY N/A 04/13/2020   Procedure: HYSTERECTOMY ABDOMINAL;  Surgeon: Silverio Lay, MD;  Location: [facility];  Service: Gynecology;  Laterality: N/A;  AUTO INFUSION PROTOCOL   BILATERAL SALPINGECTOMY Bilateral 04/13/2020   Procedure: OPEN BILATERAL SALPINGECTOMY;  Surgeon: Silverio Lay, MD;  Location: [facility];  Service: Gynecology;  Laterality: Bilateral;   BREAST REDUCTION SURGERY Bilateral 04/26/2013   Procedure: BILATERAL BREAST REDUCTION WITH LIPOSUCTION;  Surgeon: Louisa Second, MD;  Location: [facility];  Service: Plastics;  Laterality: Bilateral;   CHOLECYSTECTOMY N/A 05/10/2016   Procedure: LAPAROSCOPIC CHOLECYSTECTOMY WITH INTRAOPERATIVE CHOLANGIOGRAM;  Surgeon: Manus Rudd, MD;  Location: [facility];  Service: General;  Laterality: N/A;   FOOT SURGERY  2003; 2004   each foot - bunion, hammer toe   OSTEOCHONDRAL DEFECT REPAIR/RECONSTRUCTION Right 05/14/2021   Procedure: right knee osteochondral open allograft fro medial femoral defect;  Surgeon: Cammy Copa, MD;  Location: [facility];  Service: Orthopedics;  Laterality: Right;   Social History   Occupational History   Not on file  Tobacco Use   Smoking status: Never   Smokeless tobacco: Never  Vaping Use   Vaping status: Never Used  Substance and Sexual Activity   Alcohol use: Yes    Comment: rarely   Drug use: No   Sexual activity: Yes     Birth control/protection: I.U.D.

## 2023-01-20 DIAGNOSIS — Z01419 Encounter for gynecological examination (general) (routine) without abnormal findings: Secondary | ICD-10-CM | POA: Diagnosis not present

## 2023-01-20 DIAGNOSIS — R61 Generalized hyperhidrosis: Secondary | ICD-10-CM | POA: Diagnosis not present

## 2023-01-20 DIAGNOSIS — Z1231 Encounter for screening mammogram for malignant neoplasm of breast: Secondary | ICD-10-CM | POA: Diagnosis not present

## 2023-01-20 DIAGNOSIS — E041 Nontoxic single thyroid nodule: Secondary | ICD-10-CM | POA: Diagnosis not present

## 2023-01-20 DIAGNOSIS — Z139 Encounter for screening, unspecified: Secondary | ICD-10-CM | POA: Diagnosis not present

## 2023-03-07 ENCOUNTER — Encounter: Payer: Self-pay | Admitting: *Deleted

## 2023-03-07 ENCOUNTER — Other Ambulatory Visit: Payer: Self-pay

## 2023-03-07 ENCOUNTER — Ambulatory Visit
Admission: EM | Admit: 2023-03-07 | Discharge: 2023-03-07 | Disposition: A | Discharge status: Home / Self Care | Payer: Federal, State, Local not specified - PPO | Attending: Internal Medicine | Admitting: Internal Medicine

## 2023-03-07 DIAGNOSIS — R1032 Left lower quadrant pain: Secondary | ICD-10-CM

## 2023-03-07 MED ORDER — METHOCARBAMOL 500 MG PO TABS: 500.0000 mg | ORAL_TABLET | Freq: Two times a day (BID) | ORAL | 0 refills | Status: DC | PRN

## 2023-03-07 NOTE — Discharge Instructions (Signed)
I have prescribed you a muscle relaxer given concern of muscle strain.  It can make you sleepy so do not drive or drink alcohol with taking it.  Please follow-up if any symptoms persist or worsen.

## 2023-03-07 NOTE — ED Provider Notes (Signed)
[name]    CSN: [id number] Arrival date & time: 03/07/23  1903      History   Chief Complaint Chief Complaint  Patient presents with   Groin Pain    HPI Michele [name] is a 47 y.o. female.   Patient presents with left inner groin pain that started this morning.  She denies any obvious injury to the area but does report that she has been walking a lot in the mornings recently.  Denies history of chronic pain in this area.  Patient is not reporting any numbness or tingling.  She has taken Tylenol with minimal improvement.  Reports movement elicits pain.     Groin Pain    Past Medical History:  Diagnosis Date   Allergy    Blood transfusion without reported diagnosis    Cholecystitis, acute with cholelithiasis 05/09/2016   Diabetes mellitus without complication (HCC)    Hypertension    under control with med., has been on med. x 2 yr.   Hypertrophy of breast 03/2013   Obesity     Patient Active Problem List   Diagnosis Date Noted   Osteochondritis dissecans of right knee    Multinodular goiter 12/31/2020   Fibroids 04/03/2020   Patellofemoral syndrome of right knee 11/24/2019   Nonallopathic lesion of lumbar region 09/09/2019   Nonallopathic lesion of sacral region 09/09/2019   Nonallopathic lesion of thoracic region 09/09/2019   Nonallopathic lesion of rib cage 09/09/2019   Nonallopathic lesion of cervical region 09/09/2019   Acute bursitis of left shoulder 09/09/2019   Gynecomastia, female 02/03/2013   Back pain, acute 06/22/2012   Obesity 05/25/2010   Essential hypertension 05/25/2010    Past Surgical History:  Procedure Laterality Date   ABDOMINAL HYSTERECTOMY N/A 04/13/2020   Procedure: HYSTERECTOMY ABDOMINAL;  Surgeon: Silverio Lay, MD;  Location: [facility];  Service: Gynecology;  Laterality: N/A;  AUTO INFUSION PROTOCOL   BILATERAL SALPINGECTOMY Bilateral 04/13/2020   Procedure: OPEN BILATERAL SALPINGECTOMY;  Surgeon: Silverio Lay,  MD;  Location: [facility];  Service: Gynecology;  Laterality: Bilateral;   BREAST REDUCTION SURGERY Bilateral 04/26/2013   Procedure: BILATERAL BREAST REDUCTION WITH LIPOSUCTION;  Surgeon: Louisa Second, MD;  Location: [facility];  Service: Plastics;  Laterality: Bilateral;   CHOLECYSTECTOMY N/A 05/10/2016   Procedure: LAPAROSCOPIC CHOLECYSTECTOMY WITH INTRAOPERATIVE CHOLANGIOGRAM;  Surgeon: Manus Rudd, MD;  Location: [facility];  Service: General;  Laterality: N/A;   FOOT SURGERY  2003; 2004   each foot - bunion, hammer toe   OSTEOCHONDRAL DEFECT REPAIR/RECONSTRUCTION Right 05/14/2021   Procedure: right knee osteochondral open allograft fro medial femoral defect;  Surgeon: Cammy Copa, MD;  Location: [facility];  Service: Orthopedics;  Laterality: Right;    OB History     Gravida  2   Para  2   Term  1   Preterm      AB      Living         SAB      IAB      Ectopic      Multiple      Live Births               Home Medications    Prior to Admission medications   Medication Sig Start Date End Date Taking? Authorizing Provider  Cholecalciferol (VITAMIN D) 50 MCG (2000 UT) CAPS Take 1 capsule (2,000 Units total) by mouth daily. 03/08/20  Yes Wynn Banker, MD  ELDERBERRY  PO Take 50 mg by mouth daily.   Yes [provider]  lisinopril-hydrochlorothiazide (ZESTORETIC) 20-12.5 MG tablet Take 1 tablet by mouth daily. 05/15/22  Yes Deeann Saint, MD  methocarbamol (ROBAXIN) 500 MG tablet Take 1 tablet (500 mg total) by mouth 2 (two) times daily as needed for muscle spasms. 03/07/23  Yes Rafan Sanders, Acie Fredrickson, FNP  Multiple Vitamins-Minerals (MULTIVITAMIN ADULTS PO) Take by mouth daily.   Yes [provider]  blood glucose meter kit and supplies KIT Dispense based on patient and insurance preference. Use up to four times daily as directed. (FOR ICD-9 250.00, 250.01). 02/25/19   Wynn Banker, MD    Family  History Family History  Problem Relation Age of Onset   Migraines Mother    Arthritis Mother    Deep vein thrombosis Mother        started after ortho surgery; lifetime   Hyperlipidemia Mother    Cancer Father 37       Prostate   Heart disease Father        bypass   Heart attack Father 46   Hypertension Father    Headache Brother    Other Brother        ?something with heart; under eval   Heart attack Paternal Uncle 47   Glaucoma Maternal Grandmother    Head & neck cancer Maternal Grandmother    Alzheimer's disease Maternal Grandfather    Cancer Paternal Grandmother        blood?   Cancer Paternal Grandfather        prostate   Thyroid disease Neg Hx    Esophageal cancer Neg Hx    Rectal cancer Neg Hx    Stomach cancer Neg Hx     Social History Social History   Tobacco Use   Smoking status: Never   Smokeless tobacco: Never  Vaping Use   Vaping status: Never Used  Substance Use Topics   Alcohol use: Yes    Comment: rarely   Drug use: No     Allergies   Patient has no known allergies.   Review of Systems Review of Systems Per HPI  Physical Exam Triage Vital Signs ED Triage Vitals  Encounter Vitals Group     BP 03/07/23 1926 113/75     Systolic BP Percentile --      Diastolic BP Percentile --      Pulse Rate 03/07/23 1926 88     Resp 03/07/23 1926 18     Temp 03/07/23 1926 98.7 F (37.1 C)     Temp Source 03/07/23 1926 Oral     SpO2 03/07/23 1926 97 %     Weight --      Height --      Head Circumference --      Peak Flow --      Pain Score 03/07/23 1922 6     Pain Loc --      Pain Education --      Exclude from Growth Chart --    No data found.  Updated Vital Signs BP 113/75 (BP Location: Left Arm)   Pulse 88   Temp 98.7 F (37.1 C) (Oral)   Resp 18   SpO2 97%   Visual Acuity Right Eye Distance:   Left Eye Distance:   Bilateral Distance:    Right Eye Near:   Left Eye Near:    Bilateral Near:     Physical Exam Exam conducted  with a chaperone present.  Constitutional:      General: She is not in acute distress.    Appearance: Normal appearance. She is not toxic-appearing or diaphoretic.  HENT:     Head: Normocephalic and atraumatic.  Eyes:     Extraocular Movements: Extraocular movements intact.     Conjunctiva/sclera: Conjunctivae normal.  Pulmonary:     Effort: Pulmonary effort is normal.  Musculoskeletal:     Comments: Mild tenderness to palpation to left inner groin.  There is no lymph node swelling, no additional swelling, no discoloration, no visible abscess.  Movement seems to elicit pain.  Capillary refill and pulses are intact. Patient is able to stand. No direct bony tenderness.   Neurological:     General: No focal deficit present.     Mental Status: She is alert and oriented to person, place, and time. Mental status is at baseline.  Psychiatric:        Mood and Affect: Mood normal.        Behavior: Behavior normal.        Thought Content: Thought content normal.        Judgment: Judgment normal.      UC Treatments / Results  Labs (all labs ordered are listed, but only abnormal results are displayed) Labs Reviewed - No data to display  EKG   Radiology No results found.  Procedures Procedures (including critical care time)  Medications Ordered in UC Medications - No data to display  Initial Impression / Assessment and Plan / UC Course  I have reviewed the triage vital signs and the nursing notes.  Pertinent labs & imaging results that were available during my care of the patient were reviewed by me and considered in my medical decision making (see chart for details).     Suspect muscle strain/inflammation.  There are no signs of DVT, abscess, lymph node swelling, hernia noted on exam.  Will treat with muscle relaxer to see if this will be helpful.  Given no injury or direct bony tenderness, imaging was deferred.  Patient was advised strict ER precautions if any symptoms persist  or worsen.  Patient verbalized understanding and was agreeable with plan. Final Clinical Impressions(s) / UC Diagnoses   Final diagnoses:  Left inguinal pain     Discharge Instructions      I have prescribed you a muscle relaxer given concern of muscle strain.  It can make you sleepy so do not drive or drink alcohol with taking it.  Please follow-up if any symptoms persist or worsen.    ED Prescriptions     Medication Sig Dispense Auth. Provider   methocarbamol (ROBAXIN) 500 MG tablet Take 1 tablet (500 mg total) by mouth 2 (two) times daily as needed for muscle spasms. 20 tablet [place], Acie Fredrickson, [place]      PDMP not reviewed this encounter.   Gustavus Bryant, [place] 03/07/23 2011

## 2023-03-07 NOTE — ED Triage Notes (Addendum)
Pain in left groin since this morning. Pt is using crutches from home. Denies known injury. Voices concern for blood clot. Pain increases with movement. Tried icy hot, heating pad, and tylenol without relief

## 2023-03-12 ENCOUNTER — Other Ambulatory Visit (HOSPITAL_COMMUNITY): Payer: Self-pay

## 2023-03-25 DIAGNOSIS — K080 Exfoliation of teeth due to systemic causes: Secondary | ICD-10-CM | POA: Diagnosis not present

## 2023-05-12 IMAGING — MR MR KNEE*L* W/O CM
5 series · 40 of 40 positions shown · non-contrast
Comparison: Radiographs dated August 10, 2021

CLINICAL DATA: Left knee pain for several weeks.

EXAM:
MRI OF THE LEFT KNEE WITHOUT CONTRAST
TECHNIQUE: Multiplanar, multisequence MR imaging of the left knee was
performed. No intravenous contrast was administered.

[Series 6: T2 fat-sat · axial · left · 4.0mm · 0.50mm/px · z∈[-21,+131]mm · 9 of 36 slices shown (1 of 3)]
[im 1/36]
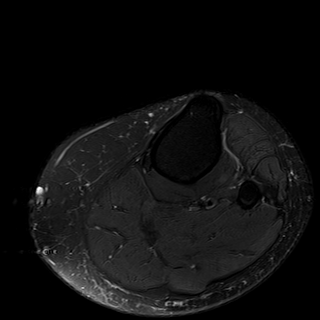
[im 5/36]
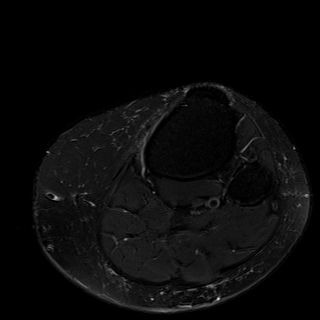
[im 9/36]
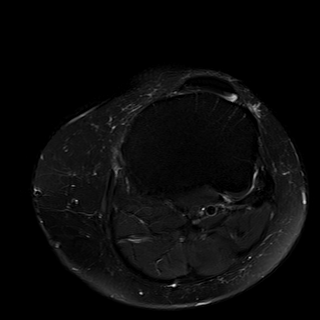
[im 14/36]
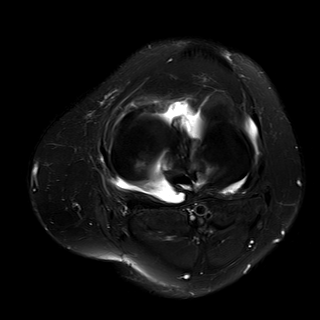
[im 18/36]
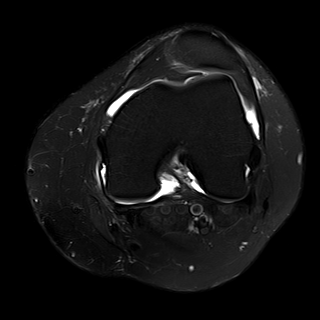
[im 22/36]
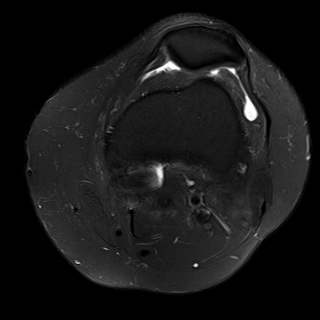
[im 27/36]
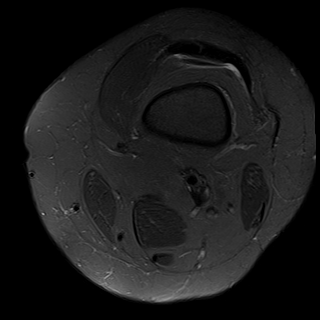
[im 31/36]
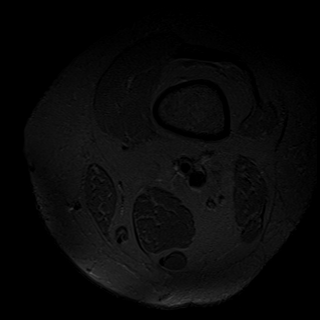
[im 36/36]
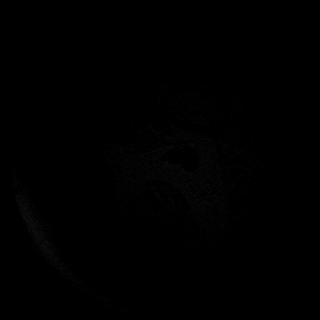

[Series 7: T2 fat-sat · coronal · left · 4.0mm · 0.47mm/px · 7 of 25 slices shown (2 of 3)]
[im 1/25]
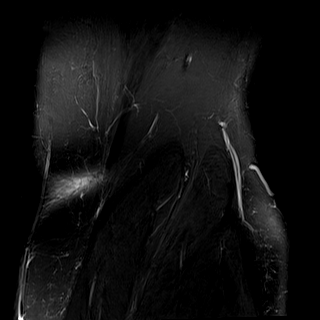
[im 5/25]
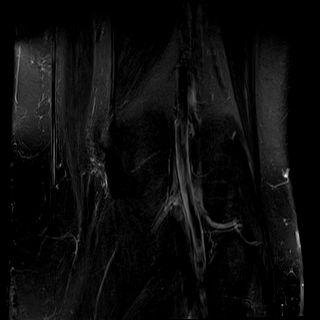
[im 9/25]
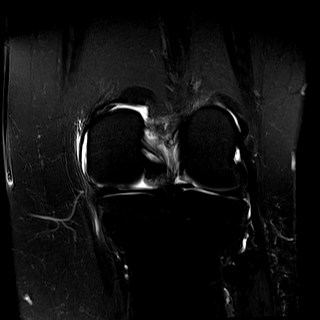
[im 13/25]
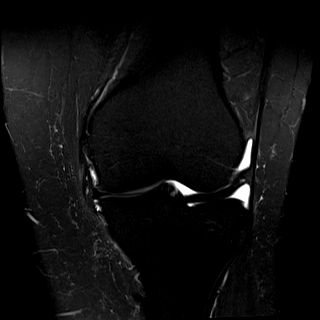
[im 17/25]
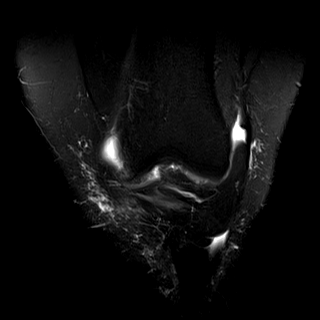
[im 21/25]
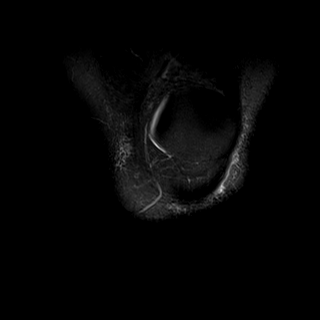
[im 25/25]
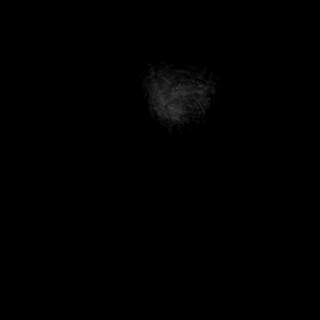

[Series 9: PD fat-sat · coronal · left · 3.0mm · 0.47mm/px · 8 of 28 slices shown (1 of 2)]
[im 1/28]
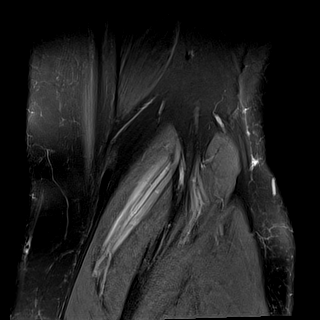
[im 4/28]
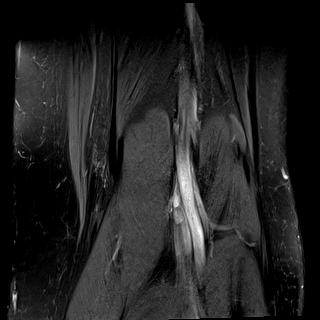
[im 8/28]
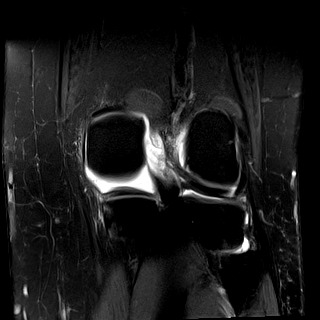
[im 12/28]
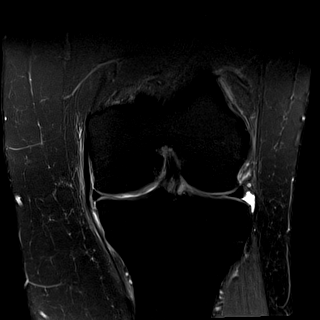
[im 16/28]
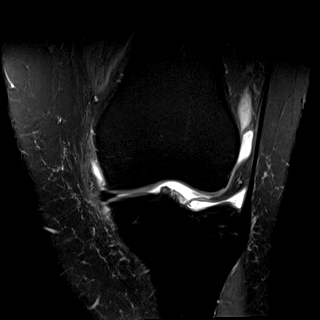
[im 20/28]
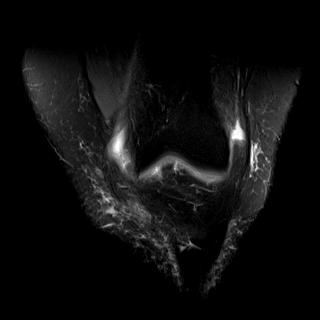
[im 24/28]
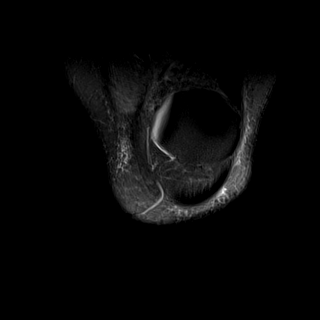
[im 28/28]
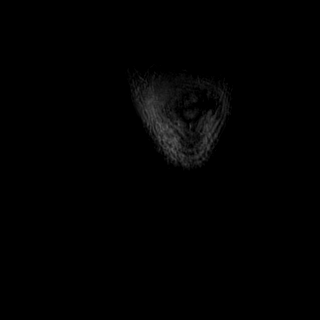

[Series 10: PD fat-sat · sagittal · left · 3.0mm · 0.39mm/px · 8 of 27 slices shown (2 of 2)]
[im 1/27]
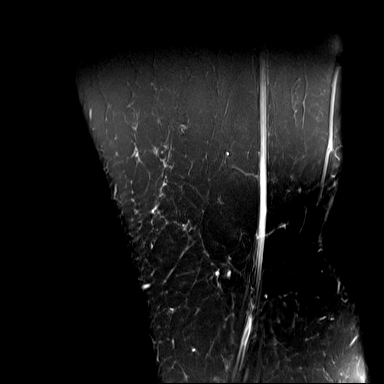
[im 4/27]
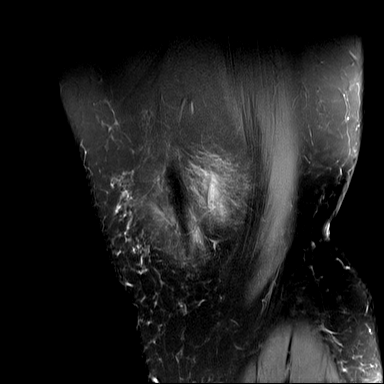
[im 8/27]
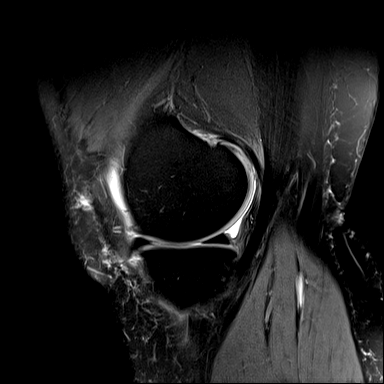
[im 12/27]
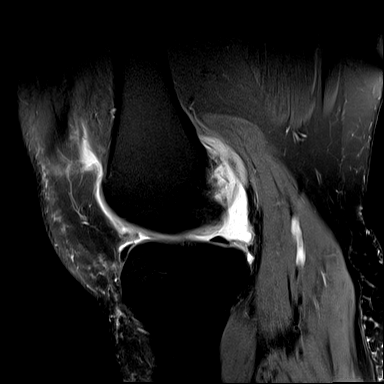
[im 15/27]
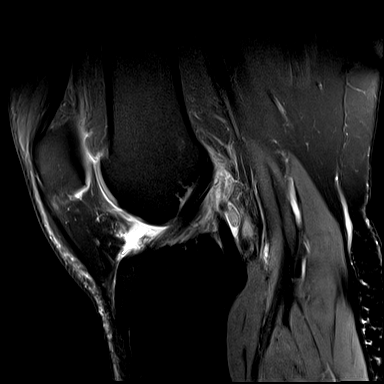
[im 19/27]
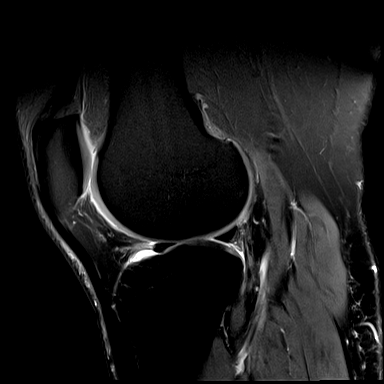
[im 23/27]
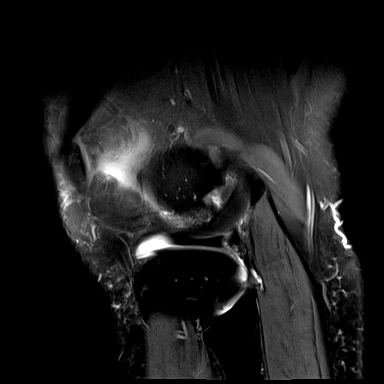
[im 27/27]
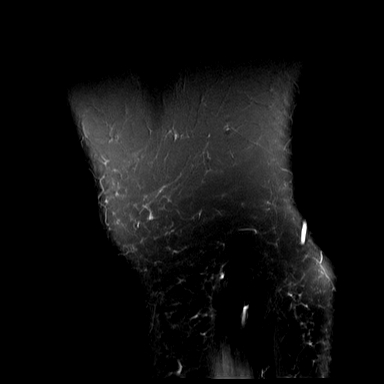

[Series 11: T2 fat-sat · sagittal · left · 3.0mm · 0.39mm/px · 8 of 27 slices shown (3 of 3)]
[im 1/27]
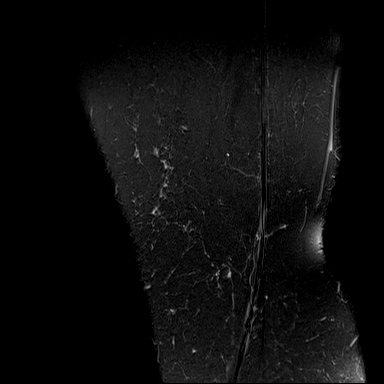
[im 4/27]
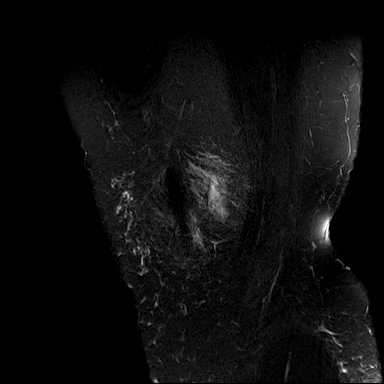
[im 8/27]
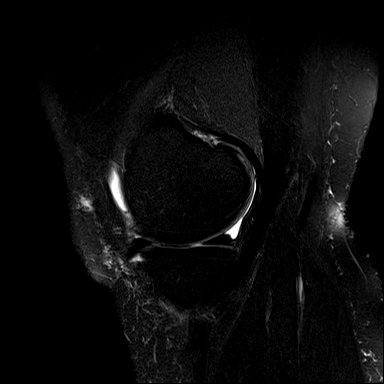
[im 12/27]
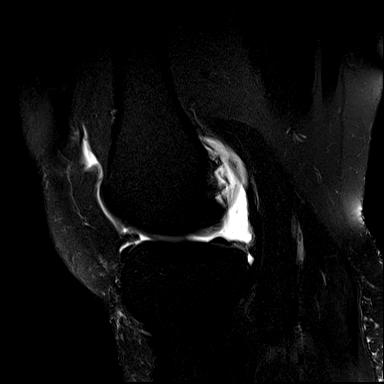
[im 15/27]
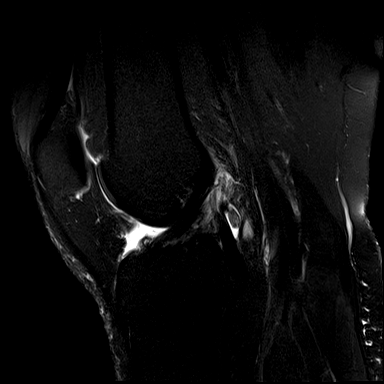
[im 19/27]
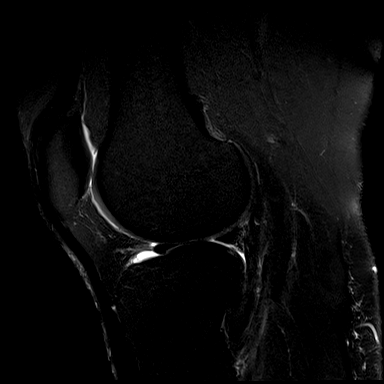
[im 23/27]
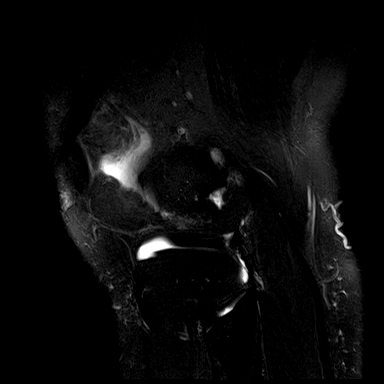
[im 27/27]
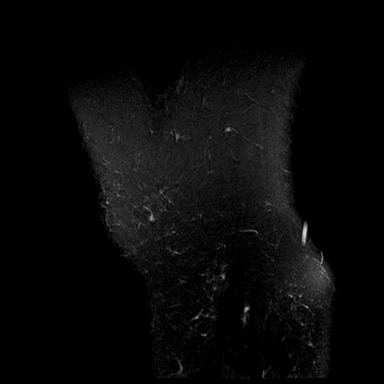

[40 of 40 positions shown; findings below may reference images not displayed]

FINDINGS: MENISCI

Medial: Intact.

Lateral: Intact.

LIGAMENTS

Cruciates: ACL and PCL are intact.

Collaterals: Medial collateral ligament is intact. Lateral
collateral ligament complex is intact.

CARTILAGE

Patellofemoral:  No chondral defect.

Medial: Advanced articular cartilage thinning with focal
full-thickness cartilage defect.

Lateral: Articular cartilage thinning without evidence of
full-thickness defect

JOINT: Moderate joint effusion. Normal [place]. No plical
thickening.

POPLITEAL FOSSA: Popliteus tendon is intact. No Baker's cyst.

EXTENSOR MECHANISM: Intact quadriceps tendon. Intact patellar
tendon. Intact lateral patellar retinaculum. Intact medial patellar
retinaculum. Intact MPFL.

BONES: No aggressive osseous lesion. No fracture or dislocation.

Other: No fluid collection or hematoma. Muscles are normal.
IMPRESSION: 1. Mild-to-moderate medial tibiofemoral osteoarthritis characterized
by joint space narrowing with focal full-thickness cartilage defect.
2. Moderate knee joint effusion, likely reactive.
3. Medial and lateral menisci are intact.
4. Cruciate and collateral ligaments are intact. Quadriceps tendon
and patellar tendon are also maintained.
5. No evidence of fracture or osteonecrosis.

## 2023-05-18 ENCOUNTER — Other Ambulatory Visit: Payer: Self-pay | Admitting: Family Medicine

## 2023-05-18 DIAGNOSIS — I10 Essential (primary) hypertension: Secondary | ICD-10-CM

## 2023-07-01 DIAGNOSIS — S0501XD Injury of conjunctiva and corneal abrasion without foreign body, right eye, subsequent encounter: Secondary | ICD-10-CM | POA: Diagnosis not present

## 2023-07-01 DIAGNOSIS — H16211 Exposure keratoconjunctivitis, right eye: Secondary | ICD-10-CM | POA: Diagnosis not present

## 2023-07-07 DIAGNOSIS — H16211 Exposure keratoconjunctivitis, right eye: Secondary | ICD-10-CM | POA: Diagnosis not present

## 2023-10-27 ENCOUNTER — Ambulatory Visit: Payer: Self-pay

## 2023-10-27 ENCOUNTER — Encounter: Payer: Self-pay | Admitting: Family Medicine

## 2023-10-27 ENCOUNTER — Ambulatory Visit: Admitting: Family Medicine

## 2023-10-27 VITALS — BP 128/82 | HR 85 | Temp 98.2°F | Ht 65.0 in | Wt 228.0 lb

## 2023-10-27 DIAGNOSIS — E66812 Obesity, class 2: Secondary | ICD-10-CM

## 2023-10-27 DIAGNOSIS — E1165 Type 2 diabetes mellitus with hyperglycemia: Secondary | ICD-10-CM

## 2023-10-27 DIAGNOSIS — E559 Vitamin D deficiency, unspecified: Secondary | ICD-10-CM | POA: Diagnosis not present

## 2023-10-27 DIAGNOSIS — Z6837 Body mass index (BMI) 37.0-37.9, adult: Secondary | ICD-10-CM

## 2023-10-27 DIAGNOSIS — I10 Essential (primary) hypertension: Secondary | ICD-10-CM

## 2023-10-27 NOTE — Telephone Encounter (Signed)
 FYI Only or Action Required?: FYI only for provider.  Patient was last seen in primary care on 09/13/2022 by Michele Clotilda SAUNDERS, MD. Called Nurse Triage reporting Blood Sugar Problem. Symptoms began several days ago. Interventions attempted: Nothing. Symptoms are: gradually worsening.  Triage Disposition: See Physician Within 24 Hours  Patient/caregiver understands and will follow disposition?: YesAnswer Assessment - Initial Assessment Questions Patient has home glucose monitoring kit. States she takes her glucose just randomly. Last time she took her glucose reading was Saturday and cannot remember time before that. She is not home now and cannot take it but instructed to take it asap.    1. BLOOD GLUCOSE: What is your blood glucose level?      299 on Saturday was a fasting gluose, had not eaten since the night before 2. ONSET: When did you check the blood glucose?     Patient states she last took it Saturday  3. USUAL RANGE: What is your glucose level usually? (e.g., usual fasting morning value, usual evening value)     Patient states I know the last few times I took it. It's been in the low 200's but then I see it and I won't eat anything with starch and itll go down 4. KETONES: Do you check for ketones (urine or blood test strips)? If Yes, ask: What does the test show now?      No 5. TYPE 1 or 2:  Do you know what type of diabetes you have?  (e.g., Type 1, Type 2, Gestational; doesn't know)      Type 2 diabetes 6. INSULIN: Do you take insulin? What type of insulin(s) do you use? What is the mode of delivery? (syringe, pen; injection or pump)?      Patient states she does not take any medication 7. DIABETES PILLS: Do you take any pills for your diabetes? If Yes, ask: Have you missed taking any pills recently?     She does not take any medication at all, has been controlled with diet and exercise 8. OTHER SYMPTOMS: Do you have any symptoms? (e.g., fever, frequent  urination, difficulty breathing, dizziness, weakness, vomiting) No  Denies: fever, sickness recently, frequent urination, difficulty breathing, dizziness, weakness, vomiting, blurry vision  Protocols used: Diabetes - High Blood Sugar-A-AH

## 2023-10-27 NOTE — Progress Notes (Signed)
 Acute Office Visit   Subjective:  Patient ID: Michele [name], female    DOB: Jul 30, 1975, 48 y.o.   MRN: [id number]  Chief Complaint  Patient presents with   Hyperglycemia    Hyperglycemia   Patient is having an acute visit for hyperglycemia. She reports she checked her blood sugar randomly over the weekend with a fasting blood sugar of 299. She reports an increase in thirst, but thought it was related to the heat. Denies increase in hunger or urination. She does not take medication for diabetes. She report she was diagnosed with diabetes 3-4 years ago. She has been managing her diabetes with diet and exercise.  Last time she seen Dr. Mercer, PCP, and had A1c checked was 09/13/2022. Last A1c was 7.3 with a recommendation to follow up in 3 months. She has failed to follow up with Dr. Mercer or any other provider.  ROS See HPI above      Objective:   BP 128/82   Pulse 85   Temp 98.2 F (36.8 C) (Oral)   Ht 5' 5 (1.651 m)   Wt 228 lb (103.4 kg)   SpO2 98%   BMI 37.94 kg/m    Physical Exam Vitals reviewed.  Constitutional:      General: She is not in acute distress.    Appearance: Normal appearance. She is obese. She is not ill-appearing, toxic-appearing or diaphoretic.  HENT:     Head: Normocephalic and atraumatic.   Eyes:     General:        Right eye: No discharge.        Left eye: No discharge.     Conjunctiva/sclera: Conjunctivae normal.    Cardiovascular:     Rate and Rhythm: Normal rate and regular rhythm.     Heart sounds: Normal heart sounds. No murmur heard.    No friction rub. No gallop.  Pulmonary:     Effort: Pulmonary effort is normal. No respiratory distress.     Breath sounds: Normal breath sounds.   Musculoskeletal:        General: Normal range of motion.   Skin:    General: Skin is warm and dry.   Neurological:     General: No focal deficit present.     Mental Status: She is alert and oriented to person, place, and time. Mental status  is at baseline.   Psychiatric:        Mood and Affect: Mood normal.        Behavior: Behavior normal.        Thought Content: Thought content normal.        Judgment: Judgment normal.       Assessment & Plan:  Essential hypertension -     Comprehensive metabolic panel with GFR  Class 2 severe obesity with serious comorbidity and body mass index (BMI) of 37.0 to 37.9 in adult, unspecified obesity type (HCC) -     CBC with Differential/Platelet -     Comprehensive metabolic panel with GFR -     Lipid panel -     TSH  Vitamin D  deficiency -     VITAMIN D  25 Hydroxy (Vit-D Deficiency, Fractures)  Type 2 diabetes mellitus with hyperglycemia, without long-term current use of insulin (HCC) -     Comprehensive metabolic panel with GFR -     Hemoglobin A1c -     Microalbumin / creatinine urine ratio  -Ordered labs (CBC, CMP, Lipid panel-not fasting, TSH, Vitamin D , and  A1c) based on medical history of HTN, Type 2 diabetes, Vitamin D  Deficiency, and BMI-obesity.  -Ordered microalbumin/creatinine based on Type 2 diabetes.  -Will provide further care when labs results are available and reviewed.   Return in about 1 month (around 11/26/2023) for chronic management: Dr. Mercer .  Arlette Schaad, NP

## 2023-10-27 NOTE — Patient Instructions (Signed)
-  It was nice to care for you today.  -Ordered labs and urine sample. Office will call with lab results and will be available on MyChart.  -Will provide further care when lab results are available and reviewed.  -Recommend to follow up with Dr. Mercer for chronic management in 1 month.

## 2023-10-28 ENCOUNTER — Ambulatory Visit: Payer: Self-pay | Admitting: Family Medicine

## 2023-10-28 DIAGNOSIS — E1165 Type 2 diabetes mellitus with hyperglycemia: Secondary | ICD-10-CM

## 2023-10-28 DIAGNOSIS — E1169 Type 2 diabetes mellitus with other specified complication: Secondary | ICD-10-CM

## 2023-10-28 LAB — LIPID PANEL
Cholesterol: 186 mg/dL (ref 0–200)
HDL: 43.3 mg/dL (ref >39.00)
LDL Cholesterol: 110 mg/dL — ABNORMAL HIGH (ref 0–99)
NonHDL: 142.67
Total CHOL/HDL Ratio: 4
Triglycerides: 163 mg/dL — ABNORMAL HIGH (ref 0.0–149.0)
VLDL: 32.6 mg/dL (ref 0.0–40.0)

## 2023-10-28 LAB — VITAMIN D 25 HYDROXY (VIT D DEFICIENCY, FRACTURES): VITD: 56.2 ng/mL (ref 30.00–100.00)

## 2023-10-28 LAB — TSH: TSH: 1.35 u[IU]/mL (ref 0.35–5.50)

## 2023-10-28 LAB — CBC WITH DIFFERENTIAL/PLATELET
Basophils Absolute: 0.1 10*3/uL (ref 0.0–0.1)
Basophils Relative: 0.7 % (ref 0.0–3.0)
Eosinophils Absolute: 0.1 10*3/uL (ref 0.0–0.7)
Eosinophils Relative: 1.1 % (ref 0.0–5.0)
HCT: 41.3 % (ref 36.0–46.0)
Hemoglobin: 13.4 g/dL (ref 12.0–15.0)
Lymphocytes Relative: 20.9 % (ref 12.0–46.0)
Lymphs Abs: 2 10*3/uL (ref 0.7–4.0)
MCHC: 32.5 g/dL (ref 30.0–36.0)
MCV: 83.9 fl (ref 78.0–100.0)
Monocytes Absolute: 0.5 10*3/uL (ref 0.1–1.0)
Monocytes Relative: 5 % (ref 3.0–12.0)
Neutro Abs: 6.8 10*3/uL (ref 1.4–7.7)
Neutrophils Relative %: 72.3 % (ref 43.0–77.0)
Platelets: 266 10*3/uL (ref 150.0–400.0)
RBC: 4.92 Mil/uL (ref 3.87–5.11)
RDW: 13.3 % (ref 11.5–15.5)
WBC: 9.5 10*3/uL (ref 4.0–10.5)

## 2023-10-28 LAB — COMPREHENSIVE METABOLIC PANEL WITH GFR
ALT: 36 U/L — ABNORMAL HIGH (ref 0–35)
AST: 35 U/L (ref 0–37)
Albumin: 4.4 g/dL (ref 3.5–5.2)
Alkaline Phosphatase: 112 U/L (ref 39–117)
BUN: 11 mg/dL (ref 6–23)
CO2: 28 meq/L (ref 19–32)
Calcium: 9.6 mg/dL (ref 8.4–10.5)
Chloride: 97 meq/L (ref 96–112)
Creatinine, Ser: 0.79 mg/dL (ref 0.40–1.20)
GFR: 89.57 mL/min (ref >60.00)
Glucose, Bld: 302 mg/dL — ABNORMAL HIGH (ref 70–99)
Potassium: 3.8 meq/L (ref 3.5–5.1)
Sodium: 134 meq/L — ABNORMAL LOW (ref 135–145)
Total Bilirubin: 0.6 mg/dL (ref 0.2–1.2)
Total Protein: 7.6 g/dL (ref 6.0–8.3)

## 2023-10-28 LAB — MICROALBUMIN / CREATININE URINE RATIO
Creatinine,U: 38.7 mg/dL
Microalb Creat Ratio: UNDETERMINED mg/g (ref 0.0–30.0)
Microalb, Ur: <0.7 mg/dL

## 2023-10-28 LAB — HEMOGLOBIN A1C: Hgb A1c MFr Bld: 11.7 % — ABNORMAL HIGH (ref 4.6–6.5)

## 2023-10-28 MED ORDER — DAPAGLIFLOZIN PROPANEDIOL 10 MG PO TABS: 10.0000 mg | ORAL_TABLET | Freq: Every day | ORAL | 0 refills | Status: AC

## 2023-10-28 MED ORDER — ROSUVASTATIN CALCIUM 10 MG PO TABS: 10.0000 mg | ORAL_TABLET | Freq: Every day | ORAL | 0 refills | Status: DC

## 2023-10-28 MED ORDER — DAPAGLIFLOZIN PROPANEDIOL 10 MG PO TABS: 10.0000 mg | ORAL_TABLET | Freq: Every day | ORAL | 0 refills | Status: DC

## 2023-10-28 MED ORDER — METFORMIN HCL 500 MG PO TABS: 1000.0000 mg | ORAL_TABLET | Freq: Two times a day (BID) | ORAL | 0 refills | Status: DC

## 2023-10-28 MED ORDER — METFORMIN HCL 500 MG PO TABS
1000.0000 mg | ORAL_TABLET | Freq: Two times a day (BID) | ORAL | 0 refills | Status: DC
Start: 2023-10-28 — End: 2024-01-27

## 2023-10-28 NOTE — Addendum Note (Signed)
 Addended by: ELNER NANNY B on: 10/28/2023 02:22 PM   Modules accepted: Orders

## 2023-11-03 ENCOUNTER — Other Ambulatory Visit: Payer: Self-pay | Admitting: Obstetrics and Gynecology

## 2023-11-03 DIAGNOSIS — Z1231 Encounter for screening mammogram for malignant neoplasm of breast: Secondary | ICD-10-CM

## 2023-11-04 MED ORDER — DAPAGLIFLOZIN PROPANEDIOL 10 MG PO TABS: 10.0000 mg | ORAL_TABLET | Freq: Every day | ORAL | 1 refills | Status: DC

## 2023-11-04 NOTE — Addendum Note (Signed)
 Addended by: ELNER NANNY B on: 11/04/2023 10:00 AM   Modules accepted: Orders

## 2023-11-13 ENCOUNTER — Encounter: Admitting: Family Medicine

## 2023-11-20 ENCOUNTER — Other Ambulatory Visit: Payer: Self-pay | Admitting: Family Medicine

## 2023-11-20 DIAGNOSIS — I10 Essential (primary) hypertension: Secondary | ICD-10-CM

## 2023-11-26 ENCOUNTER — Ambulatory Visit: Admitting: Family Medicine

## 2023-11-26 ENCOUNTER — Encounter: Payer: Self-pay | Admitting: Family Medicine

## 2023-11-26 ENCOUNTER — Ambulatory Visit (INDEPENDENT_AMBULATORY_CARE_PROVIDER_SITE_OTHER): Admitting: Family Medicine

## 2023-11-26 VITALS — BP 114/76 | HR 90 | Temp 98.6°F | Ht 65.0 in | Wt 224.6 lb

## 2023-11-26 DIAGNOSIS — E785 Hyperlipidemia, unspecified: Secondary | ICD-10-CM | POA: Diagnosis not present

## 2023-11-26 DIAGNOSIS — Z0000 Encounter for general adult medical examination without abnormal findings: Secondary | ICD-10-CM | POA: Diagnosis not present

## 2023-11-26 DIAGNOSIS — E1169 Type 2 diabetes mellitus with other specified complication: Secondary | ICD-10-CM

## 2023-11-26 DIAGNOSIS — E1165 Type 2 diabetes mellitus with hyperglycemia: Secondary | ICD-10-CM | POA: Diagnosis not present

## 2023-11-26 DIAGNOSIS — I10 Essential (primary) hypertension: Secondary | ICD-10-CM | POA: Diagnosis not present

## 2023-11-26 DIAGNOSIS — Z7984 Long term (current) use of oral hypoglycemic drugs: Secondary | ICD-10-CM | POA: Diagnosis not present

## 2023-11-26 DIAGNOSIS — E782 Mixed hyperlipidemia: Secondary | ICD-10-CM

## 2023-11-26 DIAGNOSIS — E559 Vitamin D deficiency, unspecified: Secondary | ICD-10-CM

## 2023-11-26 LAB — HEPATIC FUNCTION PANEL
ALT: 33 U/L (ref 0–35)
AST: 29 U/L (ref 0–37)
Albumin: 4.6 g/dL (ref 3.5–5.2)
Alkaline Phosphatase: 100 U/L (ref 39–117)
Bilirubin, Direct: 0.2 mg/dL (ref 0.0–0.3)
Total Bilirubin: 0.7 mg/dL (ref 0.2–1.2)
Total Protein: 7.6 g/dL (ref 6.0–8.3)

## 2023-11-26 NOTE — Patient Instructions (Addendum)
 Call clinic or send a mychart message with fasting blood sugar readings a week or so before you need the refill on medication.  If they remain elevated at that time we can send in a prescription for additional medication.  The highest dose for farxiga  is 10 mg daily.

## 2023-11-26 NOTE — Progress Notes (Signed)
 Established Patient Office Visit   Subjective  Patient ID: Michele [name], female    DOB: 1976/03/14  Age: 48 y.o. MRN: [id number]  Chief Complaint  Patient presents with   Annual Exam    Pt is a 48 yo female seen for CPE.  Pt doing well.  Had several labs on 10/27/23.  Hgb A1c at the time was 11.7%.  Pt currnetly on Farixga 10 mg daily and Metformin  1000 mg BID.  Has mammogram scheduled 01/21/24.  Pap scheduled for Sept.    Patient Active Problem List   Diagnosis Date Noted   Osteochondritis dissecans of right knee    Multinodular goiter 12/31/2020   Fibroids 04/03/2020   Patellofemoral syndrome of right knee 11/24/2019   Nonallopathic lesion of lumbar region 09/09/2019   Nonallopathic lesion of sacral region 09/09/2019   Nonallopathic lesion of thoracic region 09/09/2019   Nonallopathic lesion of rib cage 09/09/2019   Nonallopathic lesion of cervical region 09/09/2019   Acute bursitis of left shoulder 09/09/2019   Gynecomastia, female 02/03/2013   Back pain, acute 06/22/2012   Obesity 05/25/2010   Essential hypertension 05/25/2010   Past Medical History:  Diagnosis Date   Allergy    Blood transfusion without reported diagnosis    Cholecystitis, acute with cholelithiasis 05/09/2016   Diabetes mellitus without complication (HCC)    Hypertension    under control with med., has been on med. x 2 yr.   Hypertrophy of breast 03/2013   Obesity    Past Surgical History:  Procedure Laterality Date   ABDOMINAL HYSTERECTOMY N/A 04/13/2020   Procedure: HYSTERECTOMY ABDOMINAL;  Surgeon: Darcel Pool, MD;  Location: [facility];  Service: Gynecology;  Laterality: N/A;  AUTO INFUSION PROTOCOL   BILATERAL SALPINGECTOMY Bilateral 04/13/2020   Procedure: OPEN BILATERAL SALPINGECTOMY;  Surgeon: Darcel Pool, MD;  Location: [facility];  Service: Gynecology;  Laterality: Bilateral;   BREAST REDUCTION SURGERY Bilateral 04/26/2013   Procedure: BILATERAL BREAST REDUCTION WITH LIPOSUCTION;   Surgeon: Elna Pick, MD;  Location: [facility];  Service: Plastics;  Laterality: Bilateral;   BREAST SURGERY  04/26/2013   Breast reduction. Follow up procedure on 04/26/2014.   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   Procedure: right knee osteochondral open allograft fro medial femoral defect;  Surgeon: Addie Cordella Purpura, MD;  Location: [facility];  Service: Orthopedics;  Laterality: Right;   Social History   Tobacco Use   Smoking status: Never   Smokeless tobacco: Never  Vaping Use   Vaping status: Never Used  Substance Use Topics   Alcohol use: Yes    Comment: rarely   Drug use: No   Family History  Problem Relation Age of Onset   Migraines Mother    Arthritis Mother    Deep vein thrombosis Mother        started after ortho surgery; lifetime   Hyperlipidemia Mother    Cancer Father 59       Prostate   Heart disease Father        bypass   Heart attack Father 63   Hypertension Father    Headache Brother    Other Brother        ?something with heart; under eval   Heart  attack Paternal Uncle 68   Glaucoma Maternal Grandmother    Head & neck cancer Maternal Grandmother    Alzheimer's disease Maternal Grandfather    Cancer Paternal Grandmother        blood?   Cancer Paternal Grandfather        prostate   Thyroid  disease Neg Hx    Esophageal cancer Neg Hx    Rectal cancer Neg Hx    Stomach cancer Neg Hx    No Known Allergies  ROS Negative unless stated above    Objective:     BP 114/76 (BP Location: Left Arm, Patient Position: Sitting, Cuff Size: Large)   Pulse 90   Temp 98.6 F (37 C) (Oral)   Ht 5' 5 (1.651 m)   Wt 224 lb 9.6 oz (101.9 kg)    SpO2 99%   BMI 37.38 kg/m  BP Readings from Last 3 Encounters:  11/26/23 114/76  10/27/23 128/82  03/07/23 113/75   Wt Readings from Last 3 Encounters:  11/26/23 224 lb 9.6 oz (101.9 kg)  10/27/23 228 lb (103.4 kg)  11/14/22 230 lb (104.3 kg)      Physical Exam Constitutional:      Appearance: Normal appearance.  HENT:     Head: Normocephalic and atraumatic.     Right Ear: Tympanic membrane, ear canal and external ear normal.     Left Ear: Tympanic membrane, ear canal and external ear normal.     Nose: Nose normal.     Mouth/Throat:     Mouth: Mucous membranes are moist.     Pharynx: No oropharyngeal exudate or posterior oropharyngeal erythema.  Eyes:     General: No scleral icterus.    Extraocular Movements: Extraocular movements intact.     Conjunctiva/sclera: Conjunctivae normal.     Pupils: Pupils are equal, round, and reactive to light.  Neck:     Thyroid : No thyromegaly.  Cardiovascular:     Rate and Rhythm: Normal rate and regular rhythm.     Pulses: Normal pulses.     Heart sounds: Normal heart sounds. No murmur heard.    No friction rub.  Pulmonary:     Effort: Pulmonary effort is normal.     Breath sounds: Normal breath sounds. No wheezing, rhonchi or rales.  Abdominal:     General: Bowel sounds are normal.     Palpations: Abdomen is soft.     Tenderness: There is no abdominal tenderness.  Musculoskeletal:        General: No deformity. Normal range of motion.  Lymphadenopathy:     Cervical: No cervical adenopathy.  Skin:    General: Skin is warm and dry.     Findings: No lesion.  Neurological:     General: No focal deficit present.     Mental Status: She is alert and oriented to person, place, and time.  Psychiatric:        Mood and Affect: Mood normal.        Thought Content: Thought content normal.        11/26/2023   12:09 PM 05/15/2022    2:01 PM 09/10/2021   12:10 PM  Depression screen PHQ 2/9  Decreased Interest 0 0 0  Down, Depressed,  Hopeless 0 0 0  PHQ - 2 Score 0 0 0  Altered sleeping 0 0 0  Tired, decreased energy 0 0 0  Change in appetite 0 0 0  Feeling bad or failure about yourself  0 0 0  Trouble  concentrating 0 0 1  Moving slowly or fidgety/restless 0 0 0  Suicidal thoughts 0 0 0  PHQ-9 Score 0 0 1  Difficult doing work/chores   Not difficult at all      11/26/2023   12:10 PM  GAD 7 : Generalized Anxiety Score  Nervous, Anxious, on Edge 0  Control/stop worrying 0  Worry too much - different things 0  Trouble relaxing 0  Restless 0  Easily annoyed or irritable 0  Afraid - awful might happen 0  Total GAD 7 Score 0   No results found for any visits on 11/26/23.    Assessment & Plan:   Well adult exam  Type 2 diabetes mellitus with hyperglycemia, without long-term current use of insulin (HCC)  Essential hypertension  Hyperlipidemia associated with type 2 diabetes mellitus (HCC) -     Hepatic function panel   Age appropriate health screenings discussed.  Labs from 10/27/23 reviewed.  Immunizations reviewed.  Consider Tdap.  Mammogram and pap scheduled for next month.  Colonoscopy done 11/14/22.  Repeat due in 3 yrs, 2027 due to removal of 2 smaller sessile polyps and a 22 mm sessile polyp.  BP controlled.  Continue lisinopril -hydrochlorothiazide  20-12.5 mg daily.  A1c 11.7% on 10/27/2023.  Patient started on metformin  1000 mg twice daily and Farxiga  10 mg daily.  Advised to monitor blood sugars at home and keep a log of readings.  Patient to communicate readings a week or so prior to needing a refill on the medication so adjustments can be made as needed.  Patient to schedule eye exam.  Continue Crestor  10 mg daily and lifestyle modifications.  Return in about 6 weeks (around 01/07/2024).   Clotilda JONELLE Single, MD

## 2023-11-28 ENCOUNTER — Ambulatory Visit: Payer: Self-pay | Admitting: Family Medicine

## 2023-12-15 ENCOUNTER — Encounter: Payer: Self-pay | Admitting: Family Medicine

## 2023-12-16 ENCOUNTER — Other Ambulatory Visit: Payer: Self-pay | Admitting: Family Medicine

## 2023-12-16 DIAGNOSIS — I10 Essential (primary) hypertension: Secondary | ICD-10-CM

## 2023-12-31 ENCOUNTER — Telehealth: Payer: Self-pay

## 2023-12-31 NOTE — Telephone Encounter (Signed)
 Patient was identified as falling into the [place] Measure - Diabetes.   Patient was: Appointment scheduled for lab or office visit for A1c.  Appointment with Dr. Mercer on 01/21/2024.

## 2024-01-07 ENCOUNTER — Ambulatory Visit: Admitting: Family Medicine

## 2024-01-07 ENCOUNTER — Encounter: Payer: Self-pay | Admitting: Family Medicine

## 2024-01-07 VITALS — BP 122/74 | HR 67 | Temp 98.6°F | Ht 65.0 in | Wt 222.0 lb

## 2024-01-07 DIAGNOSIS — Z7984 Long term (current) use of oral hypoglycemic drugs: Secondary | ICD-10-CM

## 2024-01-07 DIAGNOSIS — E1165 Type 2 diabetes mellitus with hyperglycemia: Secondary | ICD-10-CM | POA: Diagnosis not present

## 2024-01-07 DIAGNOSIS — I10 Essential (primary) hypertension: Secondary | ICD-10-CM | POA: Diagnosis not present

## 2024-01-07 DIAGNOSIS — E782 Mixed hyperlipidemia: Secondary | ICD-10-CM

## 2024-01-07 DIAGNOSIS — E1169 Type 2 diabetes mellitus with other specified complication: Secondary | ICD-10-CM

## 2024-01-07 LAB — POCT GLYCOSYLATED HEMOGLOBIN (HGB A1C): Hemoglobin A1C: 7.1 % — AB (ref 4.0–5.6)

## 2024-01-07 MED ORDER — DAPAGLIFLOZIN PROPANEDIOL 10 MG PO TABS: 10.0000 mg | ORAL_TABLET | Freq: Every day | ORAL | 3 refills | Status: AC

## 2024-01-07 NOTE — Progress Notes (Signed)
 Established Patient Office Visit   Subjective  Patient ID: Michele [name], female    DOB: [birth year]  Age: 48 y.o. MRN: [id number]  Chief Complaint  Patient presents with   Medical Management of Chronic Issues    Patient came in today for 6 week follow-up, Diabetes and Blood pressure, BG is running 140 at home     Pt is a 48 yo female seen for f/u on DM and HTN.    Taking lisinopril -hydrochlorothiazide  20-12.5 mg daily.  Pt taking farxiga      Patient Active Problem List   Diagnosis Date Noted   Osteochondritis dissecans of right knee    Multinodular goiter 12/31/2020   Fibroids 04/03/2020   Patellofemoral syndrome of right knee 11/24/2019   Nonallopathic lesion of lumbar region 09/09/2019   Nonallopathic lesion of sacral region 09/09/2019   Nonallopathic lesion of thoracic region 09/09/2019   Nonallopathic lesion of rib cage 09/09/2019   Nonallopathic lesion of cervical region 09/09/2019   Acute bursitis of left shoulder 09/09/2019   Gynecomastia, female 02/03/2013   Back pain, acute 06/22/2012   Obesity 05/25/2010   Essential hypertension 05/25/2010   Past Medical History:  Diagnosis Date   Allergy    Blood transfusion without reported diagnosis    Cholecystitis, acute with cholelithiasis 05/09/2016   Diabetes mellitus without complication (HCC)    Hypertension    under control with med., has been on med. x 2 yr.   Hypertrophy of breast 03/2013   Obesity    Past Surgical History:  Procedure Laterality Date   ABDOMINAL HYSTERECTOMY N/A 04/13/2020   Procedure: HYSTERECTOMY ABDOMINAL;  Surgeon: Darcel Pool, MD;  Location: [facility];  Service: Gynecology;  Laterality: N/A;  AUTO INFUSION PROTOCOL   BILATERAL SALPINGECTOMY Bilateral 04/13/2020   Procedure: OPEN BILATERAL SALPINGECTOMY;  Surgeon: Darcel Pool, MD;  Location: [facility];  Service: Gynecology;  Laterality: Bilateral;   BREAST REDUCTION SURGERY Bilateral 04/26/2013   Procedure: BILATERAL BREAST REDUCTION  WITH LIPOSUCTION;  Surgeon: Elna Pick, MD;  Location: [facility];  Service: Plastics;  Laterality: Bilateral;   BREAST SURGERY  04/26/2013   Breast reduction. Follow up procedure on 04/26/2014.   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   Procedure: right knee osteochondral open allograft fro medial femoral defect;  Surgeon: Addie Cordella Pett, MD;  Location: [facility];  Service: Orthopedics;  Laterality: Right;   Social History   Tobacco Use   Smoking status: Never   Smokeless tobacco: Never  Vaping Use   Vaping status: Never Used  Substance Use Topics   Alcohol use: Yes    Comment: rarely   Drug use: No   Family History  Problem Relation Age of Onset   Migraines Mother    Arthritis Mother    Deep vein thrombosis Mother        started after ortho surgery; lifetime   Hyperlipidemia Mother    Cancer Father 25       Prostate   Heart disease Father        bypass   Heart attack Father 22   Hypertension Father    Headache Brother    Other Brother        ?something with heart; under eval  Heart attack Paternal Uncle 72   Glaucoma Maternal Grandmother    Head & neck cancer Maternal Grandmother    Alzheimer's disease Maternal Grandfather    Cancer Paternal Grandmother        blood?   Cancer Paternal Grandfather        prostate   Thyroid  disease Neg Hx    Esophageal cancer Neg Hx    Rectal cancer Neg Hx    Stomach cancer Neg Hx    No Known Allergies  ROS Negative unless stated above    Objective:     BP 122/74 (BP Location: Left Arm, Patient Position: Sitting, Cuff Size: Normal)   Pulse 67   Temp 98.6 F (37 C) (Oral)   Ht 5' 5 (1.651 m)   Wt 222 lb  (100.7 kg)   SpO2 99%   BMI 36.94 kg/m  BP Readings from Last 3 Encounters:  01/07/24 122/74  11/26/23 114/76  10/27/23 128/82   Wt Readings from Last 3 Encounters:  01/07/24 222 lb (100.7 kg)  11/26/23 224 lb 9.6 oz (101.9 kg)  10/27/23 228 lb (103.4 kg)      Physical Exam Constitutional:      General: She is not in acute distress.    Appearance: Normal appearance.  HENT:     Head: Normocephalic and atraumatic.     Nose: Nose normal.     Mouth/Throat:     Mouth: Mucous membranes are moist.  Cardiovascular:     Rate and Rhythm: Normal rate and regular rhythm.     Heart sounds: Normal heart sounds. No murmur heard.    No gallop.  Pulmonary:     Effort: Pulmonary effort is normal. No respiratory distress.     Breath sounds: Normal breath sounds. No wheezing, rhonchi or rales.  Skin:    General: Skin is warm and dry.  Neurological:     Mental Status: She is alert and oriented to person, place, and time.        01/07/2024    4:46 PM 11/26/2023   12:09 PM 05/15/2022    2:01 PM  Depression screen PHQ 2/9  Decreased Interest 0 0 0  Down, Depressed, Hopeless 0 0 0  PHQ - 2 Score 0 0 0  Altered sleeping 0 0 0  Tired, decreased energy 0 0 0  Change in appetite 0 0 0  Feeling bad or failure about yourself  0 0 0  Trouble concentrating 0 0 0  Moving slowly or fidgety/restless 0 0 0  Suicidal thoughts 0 0 0  PHQ-9 Score 0 0 0      01/07/2024    4:46 PM 11/26/2023   12:10 PM  GAD 7 : Generalized Anxiety Score  Nervous, Anxious, on Edge 0 0  Control/stop worrying 0 0  Worry too much - different things 0 0  Trouble relaxing 0 0  Restless 0 0  Easily annoyed or irritable 0 0  Afraid - awful might happen 0 0  Total GAD 7 Score 0 0   Diabetic Foot Exam - Simple   Simple Foot Form Diabetic Foot exam was performed with the following findings: Yes 01/07/2024  4:41 PM  Visual Inspection No deformities, no ulcerations, no other skin breakdown bilaterally: Yes Sensation  Testing Intact to touch and monofilament testing bilaterally: Yes Pulse Check Posterior Tibialis and Dorsalis pulse intact bilaterally: Yes Comments       Results for orders placed or performed in visit on 01/07/24  POC HgB A1c  Result Value Ref Range   Hemoglobin A1C 7.1 (A) 4.0 - 5.6 %   HbA1c POC (<> result, manual entry)     HbA1c, POC (prediabetic range)     HbA1c, POC (controlled diabetic range)        Assessment & Plan:   Type 2 diabetes mellitus with hyperglycemia, without long-term current use of insulin (HCC) -     POCT glycosylated hemoglobin (Hb A1C) -     Dapagliflozin  Propanediol; Take 1 tablet (10 mg total) by mouth daily before breakfast.  Dispense: 90 tablet; Refill: 3  Essential hypertension  Mixed hyperlipidemia  Hgb A1C was 11.7% on 10/27/23.  A1C this visit 7.1%.  Continue current meds, farxiga  10 mg daily and metformin  100 mg bid.  Lifestyle modifications.  Foot exam done this visit.  Patient to schedule eye exam.  Continue Crestor  10 mg daily.  Continue ACE I.  BP controlled.  Continue lisinopril -hydrochlorothiazide  20-12.5 mg daily.  Continue Crestor  10 mg daily.  Return in about 4 months (around 05/08/2024) for diabetes.   Clotilda JONELLE Single, MD

## 2024-01-07 NOTE — Patient Instructions (Addendum)
 You hgb A1C was 7.1%.  Keep up the good work.  We can continue the diet changes and reassess in a few months to see about adding additional medication.

## 2024-01-13 ENCOUNTER — Encounter: Payer: Self-pay | Admitting: Family Medicine

## 2024-01-14 ENCOUNTER — Ambulatory Visit: Payer: Self-pay | Admitting: Family Medicine

## 2024-01-21 ENCOUNTER — Ambulatory Visit
Admission: RE | Admit: 2024-01-21 | Discharge: 2024-01-21 | Disposition: A | Discharge status: Home / Self Care | Source: Ambulatory Visit | Attending: Obstetrics and Gynecology | Admitting: Obstetrics and Gynecology

## 2024-01-21 DIAGNOSIS — Z1231 Encounter for screening mammogram for malignant neoplasm of breast: Secondary | ICD-10-CM | POA: Diagnosis not present

## 2024-01-25 ENCOUNTER — Other Ambulatory Visit: Payer: Self-pay | Admitting: Family Medicine

## 2024-01-25 DIAGNOSIS — E1169 Type 2 diabetes mellitus with other specified complication: Secondary | ICD-10-CM

## 2024-02-19 DIAGNOSIS — Z01419 Encounter for gynecological examination (general) (routine) without abnormal findings: Secondary | ICD-10-CM | POA: Diagnosis not present

## 2024-04-20 DIAGNOSIS — H524 Presbyopia: Secondary | ICD-10-CM | POA: Diagnosis not present

## 2024-04-20 DIAGNOSIS — E119 Type 2 diabetes mellitus without complications: Secondary | ICD-10-CM | POA: Diagnosis not present

## 2024-04-26 ENCOUNTER — Other Ambulatory Visit: Payer: Self-pay | Admitting: Family Medicine

## 2024-04-26 DIAGNOSIS — E1169 Type 2 diabetes mellitus with other specified complication: Secondary | ICD-10-CM

## 2024-05-05 ENCOUNTER — Other Ambulatory Visit: Payer: Self-pay | Admitting: Family Medicine

## 2024-05-05 DIAGNOSIS — E1169 Type 2 diabetes mellitus with other specified complication: Secondary | ICD-10-CM

## 2024-05-26 ENCOUNTER — Other Ambulatory Visit: Payer: Self-pay | Admitting: Family Medicine

## 2024-05-26 DIAGNOSIS — E1169 Type 2 diabetes mellitus with other specified complication: Secondary | ICD-10-CM

## 2024-05-26 NOTE — Telephone Encounter (Signed)
 Called patient to sch follow-up, patient is sch for 2/5 patient asked to check on crestor  refill, called pharmacy, patient did not have any refills, called in 90 days with 1 refill

## 2024-06-03 ENCOUNTER — Encounter: Payer: Self-pay | Admitting: Family Medicine

## 2024-06-03 ENCOUNTER — Ambulatory Visit: Admitting: Family Medicine

## 2024-06-03 VITALS — BP 106/80 | HR 87 | Temp 98.3°F | Ht 65.0 in | Wt 218.0 lb

## 2024-06-03 DIAGNOSIS — Z23 Encounter for immunization: Secondary | ICD-10-CM

## 2024-06-03 DIAGNOSIS — E782 Mixed hyperlipidemia: Secondary | ICD-10-CM

## 2024-06-03 DIAGNOSIS — E66812 Obesity, class 2: Secondary | ICD-10-CM

## 2024-06-03 DIAGNOSIS — E1165 Type 2 diabetes mellitus with hyperglycemia: Secondary | ICD-10-CM

## 2024-06-03 DIAGNOSIS — I10 Essential (primary) hypertension: Secondary | ICD-10-CM

## 2024-06-03 LAB — POCT GLYCOSYLATED HEMOGLOBIN (HGB A1C): Hemoglobin A1C: 6.3 % — AB (ref 4.0–5.6)

## 2024-06-03 MED ORDER — DAPAGLIFLOZIN PROPANEDIOL 10 MG PO TABS: 10.0000 mg | ORAL_TABLET | Freq: Every day | ORAL | Status: AC

## 2024-06-03 NOTE — Progress Notes (Signed)
 "  Established Patient Office Visit   Subjective  Patient ID: Michele [name], female    DOB: [birth year]  Age: 49 y.o. MRN: [id number]  Chief Complaint  Patient presents with   Medical Management of Chronic Issues    Patient came in today for Diabetes(BG- 140s at home) and blood pressure    Patient is a 49 year old female seen for follow-up on chronic conditions.  BS was slightly elevated the last few wks, 149, 144, 138.   Likely 2/2 eating deep-fried Oreos made by her daughter.  Taking Farxiga  10 mg and metformin  1000 mg twice daily.  Also taking Crestor  10 mg daily.  Patient states she was initially on medication more for prevention rather than HLD.  Also taking lisinopril -hydrochlorothiazide  20-12.5 mg daily.  Patient mentions an episode of severe headache preorgasm.  Has never happened before.  No similar headaches since.  Patient inquires about possible perimenopausal symptoms.  Has occasional sensation of being hot at night but not profuse sweating.  Feels better if moves covers/kicks her leg out of the covers.    Patient Active Problem List   Diagnosis Date Noted   Osteochondritis dissecans of right knee    Multinodular goiter 12/31/2020   Fibroids 04/03/2020   Patellofemoral syndrome of right knee 11/24/2019   Nonallopathic lesion of lumbar region 09/09/2019   Nonallopathic lesion of sacral region 09/09/2019   Nonallopathic lesion of thoracic region 09/09/2019   Nonallopathic lesion of rib cage 09/09/2019   Nonallopathic lesion of cervical region 09/09/2019   Acute bursitis of left shoulder 09/09/2019   Gynecomastia, female 02/03/2013   Back pain, acute 06/22/2012   Obesity 05/25/2010   Essential hypertension 05/25/2010   Past Medical History:  Diagnosis Date   Allergy    Blood transfusion without reported diagnosis    Cholecystitis, acute with cholelithiasis 05/09/2016   Diabetes mellitus without complication (HCC)    Hypertension    under control with med., has  been on med. x 2 yr.   Hypertrophy of breast 03/2013   Obesity    Past Surgical History:  Procedure Laterality Date   ABDOMINAL HYSTERECTOMY N/A 04/13/2020   Procedure: HYSTERECTOMY ABDOMINAL;  Surgeon: Darcel Pool, MD;  Location: [facility];  Service: Gynecology;  Laterality: N/A;  AUTO INFUSION PROTOCOL   BILATERAL SALPINGECTOMY Bilateral 04/13/2020   Procedure: OPEN BILATERAL SALPINGECTOMY;  Surgeon: Darcel Pool, MD;  Location: [facility] OR;  Service: Gynecology;  Laterality: Bilateral;   BREAST REDUCTION SURGERY Bilateral 04/26/2013   Procedure: BILATERAL BREAST REDUCTION WITH LIPOSUCTION;  Surgeon: Elna Pick, MD;  Location: [facility];  Service: Plastics;  Laterality: Bilateral;   BREAST SURGERY  04/26/2013   Breast reduction. Follow up procedure on 04/26/2014.   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   Procedure: right knee osteochondral open allograft fro medial femoral defect;  Surgeon: Addie Cordella Williard, MD;  Location: [facility];  Service: Orthopedics;  Laterality: Right;   Social History[1] Family History  Problem Relation Age of Onset   Migraines Mother    Arthritis Mother    Deep vein thrombosis Mother        started after ortho surgery; lifetime   Hyperlipidemia Mother    Cancer Father 75  Prostate   Heart disease Father        bypass   Heart attack Father 85   Hypertension Father    Heart attack Paternal Uncle 17   Glaucoma Maternal Grandmother    Head & neck cancer Maternal Grandmother    Alzheimer's disease Maternal Grandfather    Cancer Paternal Grandmother        blood?   Cancer Paternal Grandfather        prostate   Headache Brother    Other  Brother        ?something with heart; under eval   Thyroid  disease Neg Hx    Esophageal cancer Neg Hx    Rectal cancer Neg Hx    Stomach cancer Neg Hx    Breast cancer Neg Hx    Allergies[2]  ROS Negative unless stated above    Objective:     BP 106/80 (BP Location: Left Arm, Patient Position: Sitting, Cuff Size: Large)   Pulse 87   Temp 98.3 F (36.8 C) (Oral)   Ht 5' 5 (1.651 m)   Wt 218 lb (98.9 kg)   SpO2 98%   BMI 36.28 kg/m  BP Readings from Last 3 Encounters:  06/03/24 106/80  01/07/24 122/74  11/26/23 114/76   Wt Readings from Last 3 Encounters:  06/03/24 218 lb (98.9 kg)  01/07/24 222 lb (100.7 kg)  11/26/23 224 lb 9.6 oz (101.9 kg)      Physical Exam Constitutional:      General: She is not in acute distress.    Appearance: Normal appearance.  HENT:     Head: Normocephalic and atraumatic.     Nose: Nose normal.     Mouth/Throat:     Mouth: Mucous membranes are moist.  Cardiovascular:     Rate and Rhythm: Normal rate and regular rhythm.     Heart sounds: Normal heart sounds. No murmur heard.    No gallop.  Pulmonary:     Effort: Pulmonary effort is normal. No respiratory distress.     Breath sounds: Normal breath sounds. No wheezing, rhonchi or rales.  Skin:    General: Skin is warm and dry.  Neurological:     Mental Status: She is alert and oriented to person, place, and time.        06/03/2024    3:18 PM 01/07/2024    4:46 PM 11/26/2023   12:09 PM  Depression screen PHQ 2/9  Decreased Interest 0 0 0  Down, Depressed, Hopeless 0 0 0  PHQ - 2 Score 0 0 0  Altered sleeping 0 0 0  Tired, decreased energy 0 0 0  Change in appetite 0 0 0  Feeling bad or failure about yourself  0 0 0  Trouble concentrating 0 0 0  Moving slowly or fidgety/restless 0 0 0  Suicidal thoughts 0 0 0  PHQ-9 Score 0 0  0      Data saved with a previous flowsheet row definition      06/03/2024    3:18 PM 01/07/2024    4:46 PM 11/26/2023   12:10 PM  GAD 7 :  Generalized Anxiety Score  Nervous, Anxious, on Edge 0 0  0   Control/stop worrying 0 0  0   Worry too much - different things 0 0  0   Trouble relaxing 0 0  0   Restless 0 0  0   Easily annoyed or irritable 0 0  0   Afraid - awful  might happen 0 0  0   Total GAD 7 Score 0 0 0     Data saved with a previous flowsheet row definition     Results for orders placed or performed in visit on 06/03/24  POC HgB A1c  Result Value Ref Range   Hemoglobin A1C 6.3 (A) 4.0 - 5.6 %   HbA1c POC (<> result, manual entry)     HbA1c, POC (prediabetic range)     HbA1c, POC (controlled diabetic range)        Assessment & Plan:   Type 2 diabetes mellitus with hyperglycemia, without long-term current use of insulin (HCC) -     POCT glycosylated hemoglobin (Hb A1C) -     Dapagliflozin  Propanediol; Take 1 tablet (10 mg total) by mouth daily.  Essential hypertension  Class 2 severe obesity with serious comorbidity and body mass index (BMI) of 36.0 to 36.9 in adult, unspecified obesity type  Need for tetanus booster -     Tdap vaccine greater than or equal to 7yo IM  Mixed hyperlipidemia  DM2 well-controlled.  A1c 6.3% this visit.  Previously 7.1% continue current medications including Farxiga  10 mg daily and metformin  1000 mg twice daily.  Discussed the importance of lifestyle modifications.  Lipid panel reviewed with patient, total 186, HDL 43.3, LDL 110, triglycerides 163.  Continue Crestor  10 mg daily.  Body mass index is 36.28 kg/m.  Increase physical activity encouraged.  BP well-controlled.  Patient to monitor BP at home.  For continued low normal readings discussed decreasing BP medication, either cutting dose in half or taking one of the medications in the combination pill.  For now continue lisinopril -HCTZ 20-12.5 mg daily.  Tetanus booster given this visit.   Return in about 4 months (around 10/01/2024) for chronic conditions, diabetes, blood pressure.   Clotilda JONELLE Single, MD      [1]   Social History Tobacco Use   Smoking status: Never   Smokeless tobacco: Never  Vaping Use   Vaping status: Never Used  Substance Use Topics   Alcohol use: Yes    Comment: rarely   Drug use: No  [2] No Known Allergies  "

## 2024-10-01 ENCOUNTER — Ambulatory Visit: Admitting: Family Medicine
